# Patient Record
Sex: Female | Born: 1950
Health system: Southern US, Community
[De-identification: ages and names within clinical notes are randomized; demographics above are authoritative.]

## PROBLEM LIST (undated history)

## (undated) DIAGNOSIS — K460 Unspecified abdominal hernia with obstruction, without gangrene: Secondary | ICD-10-CM

## (undated) DIAGNOSIS — T7840XA Allergy, unspecified, initial encounter: Secondary | ICD-10-CM

## (undated) DIAGNOSIS — T4145XA Adverse effect of unspecified anesthetic, initial encounter: Secondary | ICD-10-CM

## (undated) DIAGNOSIS — T8859XA Other complications of anesthesia, initial encounter: Secondary | ICD-10-CM

## (undated) DIAGNOSIS — E669 Obesity, unspecified: Secondary | ICD-10-CM

## (undated) DIAGNOSIS — C50919 Malignant neoplasm of unspecified site of unspecified female breast: Secondary | ICD-10-CM

## (undated) DIAGNOSIS — I10 Essential (primary) hypertension: Secondary | ICD-10-CM

## (undated) HISTORY — DX: Allergy, unspecified, initial encounter: T78.40XA

## (undated) HISTORY — DX: Obesity, unspecified: E66.9

## (undated) HISTORY — DX: Adverse effect of unspecified anesthetic, initial encounter: T41.45XA

## (undated) HISTORY — DX: Essential (primary) hypertension: I10

## (undated) HISTORY — DX: Other complications of anesthesia, initial encounter: T88.59XA

## (undated) HISTORY — DX: Malignant neoplasm of unspecified site of unspecified female breast: C50.919

---

## 1984-03-31 HISTORY — PX: ABDOMINAL HYSTERECTOMY: SHX81

## 2002-01-10 ENCOUNTER — Ambulatory Visit (HOSPITAL_COMMUNITY): Admission: RE | Admit: 2002-01-10 | Discharge: 2002-01-10 | Payer: Self-pay | Admitting: Family Medicine

## 2002-01-10 ENCOUNTER — Encounter: Payer: Self-pay | Admitting: Family Medicine

## 2002-10-27 ENCOUNTER — Encounter: Payer: Self-pay | Admitting: Family Medicine

## 2002-10-27 ENCOUNTER — Ambulatory Visit (HOSPITAL_COMMUNITY): Admission: RE | Admit: 2002-10-27 | Discharge: 2002-10-27 | Payer: Self-pay | Admitting: *Deleted

## 2002-11-08 ENCOUNTER — Ambulatory Visit (HOSPITAL_COMMUNITY): Admission: RE | Admit: 2002-11-08 | Discharge: 2002-11-08 | Payer: Self-pay | Admitting: Family Medicine

## 2002-11-08 ENCOUNTER — Encounter: Payer: Self-pay | Admitting: Family Medicine

## 2002-12-19 ENCOUNTER — Ambulatory Visit (HOSPITAL_COMMUNITY): Admission: RE | Admit: 2002-12-19 | Discharge: 2002-12-19 | Payer: Self-pay | Admitting: General Surgery

## 2002-12-19 ENCOUNTER — Encounter: Payer: Self-pay | Admitting: General Surgery

## 2003-11-07 ENCOUNTER — Ambulatory Visit (HOSPITAL_COMMUNITY): Admission: RE | Admit: 2003-11-07 | Discharge: 2003-11-07 | Payer: Self-pay | Admitting: Family Medicine

## 2004-11-13 ENCOUNTER — Ambulatory Visit (HOSPITAL_COMMUNITY): Admission: RE | Admit: 2004-11-13 | Discharge: 2004-11-13 | Payer: Self-pay | Admitting: Family Medicine

## 2005-11-17 ENCOUNTER — Ambulatory Visit (HOSPITAL_COMMUNITY): Admission: RE | Admit: 2005-11-17 | Discharge: 2005-11-17 | Payer: Self-pay | Admitting: Family Medicine

## 2006-11-23 ENCOUNTER — Ambulatory Visit (HOSPITAL_COMMUNITY): Admission: RE | Admit: 2006-11-23 | Discharge: 2006-11-23 | Payer: Self-pay | Admitting: Family Medicine

## 2006-12-03 ENCOUNTER — Encounter: Admission: RE | Admit: 2006-12-03 | Discharge: 2006-12-03 | Payer: Self-pay | Admitting: Family Medicine

## 2007-03-29 ENCOUNTER — Ambulatory Visit (HOSPITAL_COMMUNITY): Admission: RE | Admit: 2007-03-29 | Discharge: 2007-03-29 | Payer: Self-pay | Admitting: Family Medicine

## 2007-07-13 ENCOUNTER — Emergency Department (HOSPITAL_COMMUNITY): Admission: EM | Admit: 2007-07-13 | Discharge: 2007-07-13 | Payer: Self-pay | Admitting: Emergency Medicine

## 2007-12-08 ENCOUNTER — Ambulatory Visit (HOSPITAL_COMMUNITY): Admission: RE | Admit: 2007-12-08 | Discharge: 2007-12-08 | Payer: Self-pay | Admitting: Family Medicine

## 2007-12-24 ENCOUNTER — Ambulatory Visit (HOSPITAL_COMMUNITY): Admission: RE | Admit: 2007-12-24 | Discharge: 2007-12-24 | Payer: Self-pay | Admitting: Obstetrics & Gynecology

## 2008-01-17 ENCOUNTER — Encounter (HOSPITAL_COMMUNITY): Admission: RE | Admit: 2008-01-17 | Discharge: 2008-02-16 | Payer: Self-pay | Admitting: Endocrinology

## 2008-04-28 ENCOUNTER — Ambulatory Visit (HOSPITAL_COMMUNITY): Admission: RE | Admit: 2008-04-28 | Discharge: 2008-04-28 | Payer: Self-pay | Admitting: Family Medicine

## 2008-12-18 ENCOUNTER — Ambulatory Visit (HOSPITAL_COMMUNITY): Admission: RE | Admit: 2008-12-18 | Discharge: 2008-12-18 | Payer: Self-pay | Admitting: Family Medicine

## 2009-12-04 ENCOUNTER — Emergency Department (HOSPITAL_COMMUNITY): Admission: EM | Admit: 2009-12-04 | Discharge: 2009-12-04 | Payer: Self-pay | Admitting: Emergency Medicine

## 2009-12-24 ENCOUNTER — Ambulatory Visit (HOSPITAL_COMMUNITY): Admission: RE | Admit: 2009-12-24 | Discharge: 2009-12-24 | Payer: Self-pay | Admitting: Family Medicine

## 2010-04-22 ENCOUNTER — Encounter: Payer: Self-pay | Admitting: Family Medicine

## 2010-08-16 NOTE — Op Note (Signed)
   NAME:  ADELIS, DOCTER NO.:  [id number]   MEDICAL RECORD NO.:  [id number]                   PATIENT TYPE:   LOCATION:                                       FACILITY:   PHYSICIAN:  Dalia Heading, M.D.               DATE OF BIRTH:   DATE OF PROCEDURE:  12/19/2002  DATE OF DISCHARGE:                                 OPERATIVE REPORT   PREOPERATIVE DIAGNOSIS:  Right breast mass, nonpalpable.   POSTOPERATIVE DIAGNOSIS:  Right breast mass, nonpalpable.   PROCEDURE:  Right breast biopsy after needle localization.   SURGEON:  Dalia Heading, M.D.   ANESTHESIA:  MAC   INDICATIONS:  The patient is a 60 year old black female who presents with  abnormal microcalcifications in the outer portion of the right breast.  The  risks and benefits of the procedure including bleeding and infection were  fully explained to the patient, who gave informed consent.   DESCRIPTION OF PROCEDURE:  The patient was placed in the supine position  after undergoing needle localization in the x-ray department.  The right breast was prepped and draped using the usual sterile technique  with Betadine.  Surgical site confirmation was performed.   Xylocaine 1% was used for local anesthesia.  An incision was made around the  needle and this was taken down.  The dissection was taken down around the  needle to its tip.  The initial specimen radiography did not reveal the  microcalcifications to be present.  Two additional samples were removed and  the third one did contain the microcalcifications.  This was then sent to  pathology for further examination.  Any bleeding was controlled using Bovie  electrocautery.  The skin was reapproximated using a 4-0 Vicryl subcuticular  suture.  Steri-Strips and a dry sterile dressing were applied.   All tape and needle counts were correct at the end of the procedure.  The  patient was transferred to PACU in stable condition.   COMPLICATIONS:   None    SPECIMEN:  Right breast biopsy   BLOOD LOSS:  Minimal      ___________________________________________                                            Dalia Heading, M.D.   MAJ/MEDQ  D:  12/19/2002  T:  12/19/2002  Job:  [id number]   cc:   Corrie Mckusick, M.D.  [address]. [address]  [place]  [place] [address]  Fax: [phone]

## 2010-08-16 NOTE — H&P (Signed)
   NAME:  Chelsey Johnson, Chelsey Johnson NO.:  [id number]   MEDICAL RECORD NO.:  [id number]                   PATIENT TYPE:   LOCATION:                                       FACILITY:   PHYSICIAN:  Dalia Heading, M.D.               DATE OF BIRTH:   DATE OF ADMISSION:  DATE OF DISCHARGE:                                HISTORY & PHYSICAL   CHIEF COMPLAINT:  Right breast mass, nonpalpable.   HISTORY OF PRESENT ILLNESS:  The patient is a 60 year old black female who  is referred for evaluation and treatment of a right breast mass.  It is not  palpable.  It was seen on routine mammography in the upper, outer quadrant.  No nipple discharge or family history of breast carcinoma is noted.  She is  status post a hysterectomy with bilateral salpingo-oophorectomy in the  remote past.   PAST MEDICAL HISTORY:  Hypertension.   PAST SURGICAL HISTORY:  Hysterectomy with bilateral salpingo-oophorectomy.   CURRENT MEDICATIONS:  Uniretic, hormone supplementation.   ALLERGIES:  TYLOX ands SUDAFED.   REVIEW OF SYSTEMS:  The patient denies drinking or smoking.  She denies any  other cardiopulmonary difficulties or bleeding disorders.   PHYSICAL EXAMINATION:  GENERAL:  The patient is a well-developed, well-  nourished black female in no acute distress.  VITAL SIGNS:  She is afebrile and vital signs are stable.  NECK:  Neck is supple without lymphadenopathy.  RESPIRATORY:  Lungs clear to auscultation with equal breath sounds  bilaterally.  CARDIAC:  Heart examination reveals regular rate and rhythm without S3, S4,  or murmurs.  BREASTS:  Right breast examination reveals no dominant mass, nipple  discharge, or dimpling.  The axilla is negative for palpable nodes.  Left  breast examination reveals no dominant mass, nipple discharge or dimpling.  The axilla is negative for palpable nodes.   MAMMOGRAPHY:  Mammography reveals suspicious microcalcifications in the  right breast,  upper outer quadrant.   IMPRESSION:  Right breast mass, nonpalpable, microcalcifications.    PLAN:  The patient is scheduled for right breast biopsy after needle  localization on December 19, 2002.  The risks and benefits of the procedure  including bleeding and infection were fully explained to the patient, who  gave informed consent.                                               Dalia Heading, M.D.    MAJ/MEDQ  D:  11/17/2002  T:  11/17/2002  Job:  [id number]   cc:   Corrie Mckusick, M.D.  [address] [place]. [address]  [place]  [place] [address]  Fax: [phone]

## 2010-11-18 ENCOUNTER — Other Ambulatory Visit (HOSPITAL_COMMUNITY): Payer: Self-pay | Admitting: Family Medicine

## 2010-11-18 DIAGNOSIS — Z139 Encounter for screening, unspecified: Secondary | ICD-10-CM

## 2010-12-27 ENCOUNTER — Ambulatory Visit (HOSPITAL_COMMUNITY)
Admission: RE | Admit: 2010-12-27 | Discharge: 2010-12-27 | Disposition: A | Discharge status: Home / Self Care | Payer: BC Managed Care – PPO | Source: Ambulatory Visit | Attending: Family Medicine | Admitting: Family Medicine

## 2010-12-27 DIAGNOSIS — Z1231 Encounter for screening mammogram for malignant neoplasm of breast: Secondary | ICD-10-CM | POA: Insufficient documentation

## 2010-12-27 DIAGNOSIS — Z139 Encounter for screening, unspecified: Secondary | ICD-10-CM

## 2011-08-18 ENCOUNTER — Ambulatory Visit (HOSPITAL_COMMUNITY)
Admission: RE | Admit: 2011-08-18 | Discharge: 2011-08-18 | Disposition: A | Discharge status: Home / Self Care | Payer: BC Managed Care – PPO | Source: Ambulatory Visit | Attending: Internal Medicine | Admitting: Internal Medicine

## 2011-08-18 ENCOUNTER — Other Ambulatory Visit (HOSPITAL_COMMUNITY): Payer: Self-pay | Admitting: Internal Medicine

## 2011-08-18 DIAGNOSIS — M7989 Other specified soft tissue disorders: Secondary | ICD-10-CM | POA: Insufficient documentation

## 2011-08-18 DIAGNOSIS — R609 Edema, unspecified: Secondary | ICD-10-CM

## 2011-09-11 ENCOUNTER — Other Ambulatory Visit: Payer: Self-pay | Admitting: Gastroenterology

## 2011-09-11 ENCOUNTER — Encounter: Payer: Self-pay | Admitting: Urgent Care

## 2011-09-11 ENCOUNTER — Ambulatory Visit (INDEPENDENT_AMBULATORY_CARE_PROVIDER_SITE_OTHER): Payer: BC Managed Care – PPO | Admitting: Urgent Care

## 2011-09-11 VITALS — BP 123/79 | HR 76 | Temp 97.2°F | Ht 61.0 in | Wt 218.0 lb

## 2011-09-11 DIAGNOSIS — K625 Hemorrhage of anus and rectum: Secondary | ICD-10-CM

## 2011-09-11 NOTE — Assessment & Plan Note (Addendum)
Chelsey Johnson is a pleasant 61 y.o. female who has had intermittent small volume hematochezia with straining to have a bowel movement. She has never had colonoscopy. Colonoscopy for further evaluation to determine etiology of rectal bleeding including benign anorectal source, colon polyp or carcinoma.  I have discussed risks & benefits which include, but are not limited to, bleeding, infection, perforation & drug reaction.  The patient agrees with this plan & written consent will be obtained.    If you have severe bleeding, go directly to the ER call us

## 2011-09-11 NOTE — Patient Instructions (Addendum)
Colonoscopy w/ Dr Darrick Penna If you have severe bleeding, call or directly to ER

## 2011-09-11 NOTE — Progress Notes (Signed)
Faxed to PCP

## 2011-09-11 NOTE — Progress Notes (Signed)
Referring Provider: Colette Ribas, MD Primary Care Physician:  Colette Ribas, MD Primary Gastroenterologist:  Dr. Jonette Eva   Chief Complaint  Patient presents with  . Colonoscopy    HPI:  Chelsey Johnson is a 61 y.o. female here as a referral from Dr. Phillips Odor for screening colonoscopy.  Upon further triage, it was noted that she has been seeing rectal bleeding, therefore she was asked to come in for office visit to discuss this further prior to procedure. She has seen "specks" of bright red blood on toilet tissue after straining to have a bowel movement intermittently for several months.  She generally has a daily soft brown BM.  He does admit to being prunes, but really doesn't have significantly hard stools.   Denies constipation, diarrhea,melena or weight loss.   Denies any upper GI symptoms including heartburn, indigestion, nausea, vomiting, dysphagia, odynophagia or anorexia.  No NSAIDs. Past Medical History  Diagnosis Date  . HTN (hypertension)   . Anesthesia complication     tachycardia  . Obesity     Past Surgical History  Procedure Date  . Abdominal hysterectomy 1986    complete, uterine fibroids  . Breast biopsy     Current Outpatient Prescriptions  Medication Sig Dispense Refill  . estradiol (ESTRACE) 1 MG tablet Take 1 mg by mouth daily.      . Moexipril-Hydrochlorothiazide 15-12.5 MG TABS Take by mouth daily.         Allergies as of 09/11/2011 - Review Complete 09/11/2011  Allergen Reaction Noted  . Anesthetics, amide Palpitations 09/11/2011  . Tylox (oxycodone-acetaminophen) Palpitations 09/11/2011    Family History  Problem Relation Age of Onset  . Ulcers Brother   . Colon polyps Mother 74    History   Social History  . Marital Status: Single    Spouse Name: N/A    Number of Children: 0  . Years of Education: N/A   Occupational History  . [employer]    Social History Main Topics  . Smoking status: Former  Smoker -- 1.0 packs/day    Types: Cigarettes    Quit date: 09/11/1991  . Smokeless tobacco: Not on file   Comment: quit about 20 + years  . Alcohol Use: No  . Drug Use: No  . Sexually Active: Not on file   Other Topics Concern  . Not on file   Social History Narrative   Lives w/ mother    Review of Systems: Gen: Denies any fever, chills, sweats, anorexia, fatigue, weakness, malaise, weight loss, and sleep disorder CV: Denies chest pain, angina, palpitations, syncope, orthopnea, PND, peripheral edema, and claudication. Resp: Denies dyspnea at rest, dyspnea with exercise, cough, sputum, wheezing, coughing up blood, and pleurisy. GI: Denies vomiting blood, jaundice, and fecal incontinence.    GU : Denies urinary burning, blood in urine, urinary frequency, urinary hesitancy, nocturnal urination, and urinary incontinence. MS: Denies joint pain, limitation of movement, and swelling, stiffness, low back pain, extremity pain. Denies muscle weakness, cramps, atrophy.  Derm: Denies rash, itching, dry skin, hives, moles, warts, or unhealing ulcers.  Psych: Denies depression, anxiety, memory loss, suicidal ideation, hallucinations, paranoia, and confusion. Heme: Denies bruising and enlarged lymph nodes. Neuro:  Denies any headaches, dizziness, paresthesias. Endo:  Denies any problems with DM, thyroid, adrenal function.  Physical Exam: BP 123/79  Pulse 76  Temp 97.2 F (36.2 C) (Temporal)  Ht 5\' 1"  (1.549 m)  Wt 218 lb (98.884 kg)  BMI 41.19 kg/m2 General:  Alert,  Well-developed, obese, pleasant and cooperative in NAD Head:  Normocephalic and atraumatic. Eyes:  Sclera clear, no icterus.   Conjunctiva pink. Ears:  Normal auditory acuity. Nose:  No deformity, discharge, or lesions. Mouth:  No deformity or lesions,oropharynx pink & moist. Neck:  Supple; no masses or thyromegaly. Lungs:  Clear throughout to auscultation.   No wheezes, crackles, or rhonchi. No acute distress. Heart:   Regular rate and rhythm; no murmurs, clicks, rubs,  or gallops. Abdomen:  Normal bowel sounds.  Obese.  No bruits.  Soft, non-tender and non-distended without masses, hepatosplenomegaly or hernias noted.  No guarding or rebound tenderness.   Rectal:  Deferred. Msk:  Symmetrical without gross deformities. Normal posture. Pulses:  Normal pulses noted. Extremities:  No clubbing or edema. Neurologic:  Alert and oriented x4;  grossly normal neurologically. Skin:  Intact without significant lesions or rashes. Lymph Nodes:  No significant cervical adenopathy. Psych:  Alert and cooperative. Normal mood and affect.

## 2011-09-12 ENCOUNTER — Encounter (HOSPITAL_COMMUNITY): Payer: Self-pay | Admitting: Pharmacy Technician

## 2011-09-18 MED ORDER — SODIUM CHLORIDE 0.45 % IV SOLN
Freq: Once | INTRAVENOUS | Status: AC
  Administered 2011-09-19: 12:00:00 via INTRAVENOUS

## 2011-09-19 ENCOUNTER — Ambulatory Visit (HOSPITAL_COMMUNITY)
Admission: RE | Admit: 2011-09-19 | Discharge: 2011-09-19 | Disposition: A | Discharge status: Home Health | Payer: BC Managed Care – PPO | Source: Ambulatory Visit | Attending: Gastroenterology | Admitting: Gastroenterology

## 2011-09-19 ENCOUNTER — Encounter (HOSPITAL_COMMUNITY)
Admission: RE | Disposition: A | Discharge status: Home Health | Payer: Self-pay | Source: Ambulatory Visit | Attending: Gastroenterology

## 2011-09-19 ENCOUNTER — Encounter (HOSPITAL_COMMUNITY): Payer: Self-pay

## 2011-09-19 DIAGNOSIS — Z1211 Encounter for screening for malignant neoplasm of colon: Secondary | ICD-10-CM

## 2011-09-19 DIAGNOSIS — K648 Other hemorrhoids: Secondary | ICD-10-CM

## 2011-09-19 DIAGNOSIS — I10 Essential (primary) hypertension: Secondary | ICD-10-CM | POA: Insufficient documentation

## 2011-09-19 DIAGNOSIS — Z79899 Other long term (current) drug therapy: Secondary | ICD-10-CM | POA: Insufficient documentation

## 2011-09-19 DIAGNOSIS — K625 Hemorrhage of anus and rectum: Secondary | ICD-10-CM

## 2011-09-19 HISTORY — DX: Other complications of anesthesia, initial encounter: T88.59XA

## 2011-09-19 HISTORY — DX: Adverse effect of unspecified anesthetic, initial encounter: T41.45XA

## 2011-09-19 HISTORY — PX: COLONOSCOPY: SHX5424

## 2011-09-19 SURGERY — COLONOSCOPY
Anesthesia: Moderate Sedation

## 2011-09-19 MED ORDER — MEPERIDINE HCL 100 MG/ML IJ SOLN
INTRAMUSCULAR | Status: AC
  Filled 2011-09-19: qty 2

## 2011-09-19 MED ORDER — MIDAZOLAM HCL 5 MG/5ML IJ SOLN
INTRAMUSCULAR | Status: DC | PRN
  Administered 2011-09-19 (×3): 1 mg via INTRAVENOUS
  Administered 2011-09-19: 2 mg via INTRAVENOUS

## 2011-09-19 MED ORDER — MEPERIDINE HCL 100 MG/ML IJ SOLN
INTRAMUSCULAR | Status: DC | PRN
  Administered 2011-09-19 (×2): 25 mg via INTRAVENOUS

## 2011-09-19 MED ORDER — MIDAZOLAM HCL 5 MG/5ML IJ SOLN
INTRAMUSCULAR | Status: AC
  Filled 2011-09-19: qty 10

## 2011-09-19 NOTE — Op Note (Signed)
[facility] [address] [place], [place]  [address]  COLONOSCOPY PROCEDURE REPORT  PATIENT:  Chelsey, Johnson  MR#:  [id number] BIRTHDATE:  05/27/1950, 60 yrs. old  GENDER:  female  ENDOSCOPIST:  Jonette Eva, MD REF. BY:  Assunta Found, M.D. ASSISTANT:  PROCEDURE DATE:  09/19/2011 PROCEDURE:  Colonoscopy 784[address]  INDICATIONS:  Screening  MEDICATIONS:   Demerol 50 mg IV, Versed 5 mg IV  DESCRIPTION OF PROCEDURE:    Physical exam was performed. Informed consent was obtained from the patient after explaining the benefits, risks, and alternatives to procedure.  The patient was connected to monitor and placed in left lateral position. Continuous oxygen was provided by nasal cannula and IV medicine administered through an indwelling cannula.  After administration of sedation and rectal exam, the patient's rectum was intubated and the EC-[id number] ([id number]) colonoscope was advanced under direct visualization to the cecum.  The scope was removed slowly by carefully examining the color, texture, anatomy, and integrity mucosa on the way out.  The patient was recovered in endoscopy and discharged home in satisfactory condition. <<PROCEDUREIMAGES>>  FINDINGS:  Normal colonoscopy without polyps, masses, vascular ectasias, or inflammatory changes.  MODERATE Internal Hemorrhoids were found.  PREP QUALITY:  EXCELLENT CECAL W/D TIME:    14.5 minutes  COMPLICATIONS:    None  ENDOSCOPIC IMPRESSION: 1) Internal hemorrhoids  RECOMMENDATIONS: High fiber diet TCS IN 10 YEARS WITH AN OVERTUBE  REPEAT EXAM:  No  ______________________________ Jonette Eva, MD  CC:  Assunta Found, M.D.  n. eSIGNED:   Sokha Craker at 09/19/2011 01:23 PM  [place], [place], [phone]

## 2011-09-19 NOTE — H&P (Signed)
  Primary Care Physician:  Colette Ribas, MD Primary Gastroenterologist:  Dr. Darrick Penna  Pre-Procedure History & Physical: HPI:  Chelsey Johnson is a 61 y.o. female here for  BRBPR.   Past Medical History  Diagnosis Date  . HTN (hypertension)   . Anesthesia complication     tachycardia  . Obesity   . Complication of anesthesia     blood pressure drops quickly with anesthetic    Past Surgical History  Procedure Date  . Abdominal hysterectomy 1986    complete, uterine fibroids  . Breast biopsy     Prior to Admission medications   Medication Sig Start Date End Date Taking? Authorizing Provider  cholecalciferol (VITAMIN D) 1000 UNITS tablet Take 1,000 Units by mouth daily.   Yes Historical Provider, MD  estradiol (ESTRACE) 1 MG tablet Take 1 mg by mouth daily.   Yes Historical Provider, MD  Moexipril-Hydrochlorothiazide 15-12.5 MG TABS Take by mouth daily.  08/22/11  Yes Historical Provider, MD  OMEGA 3 1200 MG CAPS Take 1 capsule by mouth daily.   Yes Historical Provider, MD    Allergies as of 09/11/2011 - Review Complete 09/11/2011  Allergen Reaction Noted  . Anesthetics, amide Palpitations 09/11/2011  . Tylox (oxycodone-acetaminophen) Palpitations 09/11/2011    Family History  Problem Relation Age of Onset  . Ulcers Brother   . Colon polyps Mother 72    History   Social History  . Marital Status: Single    Spouse Name: N/A    Number of Children: 0  . Years of Education: N/A   Occupational History  . [employer]    Social History Main Topics  . Smoking status: Former Smoker -- 1.0 packs/day    Types: Cigarettes    Quit date: 09/11/1991  . Smokeless tobacco: Not on file   Comment: quit about 20 + years  . Alcohol Use: No  . Drug Use: No  . Sexually Active: Not on file   Other Topics Concern  . Not on file   Social History Narrative   Lives w/ mother    Review of Systems: See HPI, otherwise negative ROS   Physical  Exam: BP 138/90  Pulse 97  Temp 98.6 F (37 C) (Oral)  Resp 22  SpO2 99% General:   Alert,  pleasant and cooperative in NAD Head:  Normocephalic and atraumatic. Neck:  Supple;  Lungs:  Clear throughout to auscultation.    Heart:  Regular rate and rhythm. Abdomen:  Soft, nontender and nondistended. Normal bowel sounds, without guarding, and without rebound.   Neurologic:  Alert and  oriented x4;  grossly normal neurologically.  Impression/Plan:    BRBPR  PLAN: TCS TODAY

## 2011-09-19 NOTE — Discharge Instructions (Signed)
You have internal hemorrhoids. YOU DID NOT HAVE ANY POLYPS. ° ° °FOLLOW A HIGH FIBER DIET. AVOID ITEMS THAT CAUSE BLOATING. SEE INFO BELOW. ° °Next colonoscopy in 10 years. ° ° °Colonoscopy °Care After °Read the instructions outlined below and refer to this sheet in the next week. These discharge instructions provide you with general information on caring for yourself after you leave the hospital. While your treatment has been planned according to the most current medical practices available, unavoidable complications occasionally occur. If you have any problems or questions after discharge, call DR. Satori Krabill, [phone]. ° °ACTIVITY °· You may resume your regular activity, but move at a slower pace for the next 24 hours.  °· Take frequent rest periods for the next 24 hours.  °· Walking will help get rid of the air and reduce the bloated feeling in your belly (abdomen).  °· No driving for 24 hours (because of the medicine (anesthesia) used during the test).  °· You may shower.  °· Do not sign any important legal documents or operate any machinery for 24 hours (because of the anesthesia used during the test).  °·  °NUTRITION °· Drink plenty of fluids.  °· You may resume your normal diet as instructed by your doctor.  °· Begin with a light meal and progress to your normal diet. Heavy or fried foods are harder to digest and may make you feel sick to your stomach (nauseated).  °· Avoid alcoholic beverages for 24 hours or as instructed.  °·  °MEDICATIONS °· You may resume your normal medications. °·  °WHAT YOU CAN EXPECT TODAY °· Some feelings of bloating in the abdomen.  °· Passage of more gas than usual.  °· Spotting of blood in your stool or on the toilet paper °· .  °IF YOU HAD POLYPS REMOVED DURING THE COLONOSCOPY: °· Eat a soft diet IF YOU HAVE NAUSEA, BLOATING, ABDOMINAL PAIN, OR VOMITING. °·   °FINDING OUT THE RESULTS OF YOUR TEST °Not all test results are available during your visit. DR. Lanya Bucks WILL CALL YOU  WITHIN 7 DAYS OF YOUR PROCEDUE WITH YOUR RESULTS. Do not assume everything is normal if you have not heard from DR. Cameo Schmiesing IN ONE WEEK, CALL HER OFFICE AT [phone]. ° °SEEK IMMEDIATE MEDICAL ATTENTION AND CALL THE OFFICE: [phone] IF: °· You have more than a spotting of blood in your stool.  °· Your belly is swollen (abdominal distention).  °· You are nauseated or vomiting.  °· You have a temperature over 101F.  °· You have abdominal pain or discomfort that is severe or gets worse throughout the day. ° °High-Fiber Diet °A high-fiber diet changes your normal diet to include more whole grains, legumes, fruits, and vegetables. Changes in the diet involve replacing refined carbohydrates with unrefined foods. The calorie level of the diet is essentially unchanged. The Dietary Reference Intake (recommended amount) for adult males is 38 grams per day. For adult females, it is 25 grams per day. Pregnant and lactating women should consume 28 grams of fiber per day. °Fiber is the intact part of a plant that is not broken down during digestion. Functional fiber is fiber that has been isolated from the plant to provide a beneficial effect in the body. °PURPOSE °· Increase stool bulk.  °· Ease and regulate bowel movements.  °· Lower cholesterol.  °INDICATIONS THAT YOU NEED MORE FIBER °· Constipation and hemorrhoids.  °· Uncomplicated diverticulosis (intestine condition) and irritable bowel syndrome.  °· Weight management.  °· As   a protective measure against hardening of the arteries (atherosclerosis), diabetes, and cancer.  ° °GUIDELINES FOR INCREASING FIBER IN THE DIET °· Start adding fiber to the diet slowly. A gradual increase of about 5 more grams (2 slices of whole-wheat bread, 2 servings of most fruits or vegetables, or 1 bowl of high-fiber cereal) per day is best. Too rapid an increase in fiber may result in constipation, flatulence, and bloating.  °· Drink enough water and fluids to keep your urine clear or pale  yellow. Water, juice, or caffeine-free drinks are recommended. Not drinking enough fluid may cause constipation.  °· Eat a variety of high-fiber foods rather than one type of fiber.  °· Try to increase your intake of fiber through using high-fiber foods rather than fiber pills or supplements that contain small amounts of fiber.  °· The goal is to change the types of food eaten. Do not supplement your present diet with high-fiber foods, but replace foods in your present diet.  °INCLUDE A VARIETY OF FIBER SOURCES °· Replace refined and processed grains with whole grains, canned fruits with fresh fruits, and incorporate other fiber sources. White rice, white breads, and most bakery goods contain little or no fiber.  °· Brown whole-grain rice, buckwheat oats, and many fruits and vegetables are all good sources of fiber. These include: broccoli, Brussels sprouts, cabbage, cauliflower, beets, sweet potatoes, white potatoes (skin on), carrots, tomatoes, eggplant, squash, berries, fresh fruits, and dried fruits.  °· Cereals appear to be the richest source of fiber. Cereal fiber is found in whole grains and bran. Bran is the fiber-rich outer coat of cereal grain, which is largely removed in refining. In whole-grain cereals, the bran remains. In breakfast cereals, the largest amount of fiber is found in those with "bran" in their names. The fiber content is sometimes indicated on the label.  °· You may need to include additional fruits and vegetables each day.  °· In baking, for 1 cup white flour, you may use the following substitutions:  °· 1 cup whole-wheat flour minus 2 tablespoons.  °· 1/2 cup white flour plus 1/2 cup whole-wheat flour.  ° °Hemorrhoids °Hemorrhoids are dilated (enlarged) veins around the rectum. Sometimes clots will form in the veins. This makes them swollen and painful. These are called thrombosed hemorrhoids. °Causes of hemorrhoids include: °· Constipation.  °· Straining to have a bowel movement. °·   HEAVY LIFTING °HOME CARE INSTRUCTIONS °· Eat a well balanced diet and drink 6 to 8 glasses of water every day to avoid constipation. You may also use a bulk laxative.  °· Avoid straining to have bowel movements.  °· Keep anal area dry and clean.  °· Do not use a donut shaped pillow or sit on the toilet for long periods. This increases blood pooling and pain.  °· Move your bowels when your body has the urge; this will require less straining and will decrease pain and pressure.  ° °

## 2011-09-24 ENCOUNTER — Encounter (HOSPITAL_COMMUNITY): Payer: Self-pay | Admitting: Gastroenterology

## 2011-10-04 ENCOUNTER — Emergency Department (HOSPITAL_COMMUNITY): Payer: BC Managed Care – PPO

## 2011-10-04 ENCOUNTER — Emergency Department (HOSPITAL_COMMUNITY)
Admission: EM | Admit: 2011-10-04 | Discharge: 2011-10-04 | Disposition: A | Discharge status: Home / Self Care | Payer: BC Managed Care – PPO | Attending: Emergency Medicine | Admitting: Emergency Medicine

## 2011-10-04 ENCOUNTER — Encounter (HOSPITAL_COMMUNITY): Payer: Self-pay | Admitting: *Deleted

## 2011-10-04 DIAGNOSIS — R5381 Other malaise: Secondary | ICD-10-CM | POA: Insufficient documentation

## 2011-10-04 DIAGNOSIS — M62830 Muscle spasm of back: Secondary | ICD-10-CM

## 2011-10-04 DIAGNOSIS — R5383 Other fatigue: Secondary | ICD-10-CM | POA: Insufficient documentation

## 2011-10-04 DIAGNOSIS — R0602 Shortness of breath: Secondary | ICD-10-CM | POA: Insufficient documentation

## 2011-10-04 DIAGNOSIS — I10 Essential (primary) hypertension: Secondary | ICD-10-CM | POA: Insufficient documentation

## 2011-10-04 DIAGNOSIS — M5380 Other specified dorsopathies, site unspecified: Secondary | ICD-10-CM | POA: Insufficient documentation

## 2011-10-04 LAB — D-DIMER, QUANTITATIVE: D-Dimer, Quant: 0.24 ug/mL-FEU (ref 0.00–0.48)

## 2011-10-04 MED ORDER — HYDROCODONE-ACETAMINOPHEN 5-325 MG PO TABS
2.0000 | ORAL_TABLET | Freq: Once | ORAL | Status: AC
  Administered 2011-10-04: 2 via ORAL
  Filled 2011-10-04: qty 2

## 2011-10-04 MED ORDER — IBUPROFEN 800 MG PO TABS: 800.0000 mg | ORAL_TABLET | Freq: Three times a day (TID) | ORAL | Status: AC

## 2011-10-04 MED ORDER — HYDROCODONE-ACETAMINOPHEN 5-325 MG PO TABS: 2.0000 | ORAL_TABLET | ORAL | Status: AC | PRN

## 2011-10-04 MED ORDER — DIAZEPAM 5 MG PO TABS
5.0000 mg | ORAL_TABLET | Freq: Once | ORAL | Status: AC
  Administered 2011-10-04: 5 mg via ORAL
  Filled 2011-10-04: qty 1

## 2011-10-04 MED ORDER — DIAZEPAM 5 MG PO TABS: 5.0000 mg | ORAL_TABLET | Freq: Two times a day (BID) | ORAL | Status: AC

## 2011-10-04 MED ORDER — KETOROLAC TROMETHAMINE 60 MG/2ML IM SOLN
60.0000 mg | Freq: Once | INTRAMUSCULAR | Status: AC
  Administered 2011-10-04: 60 mg via INTRAMUSCULAR
  Filled 2011-10-04: qty 2

## 2011-10-04 NOTE — ED Provider Notes (Signed)
History     CSN: [id number]  Arrival date & time 10/04/11  1929   First MD Initiated Contact with Patient 10/04/11 1946      Chief Complaint  Patient presents with  . Back Pain    (Consider location/radiation/quality/duration/timing/severity/associated sxs/prior treatment) HPI Comments: 2 days of L sided intermittent back spasms that come and go.  Previous history of same.  Denies injury to back.  Paraspinal thoracic pain with spasm.  No midline pain.  Pain radiates up to neck and lower back. No bowel or bladder incontinence. No weakness, numbness, tingling.  No fever or vomiting. Taking "pain pill" at home without relief.  The history is provided by the patient.    Past Medical History  Diagnosis Date  . HTN (hypertension)   . Anesthesia complication     tachycardia  . Obesity   . Complication of anesthesia     blood pressure drops quickly with anesthetic    Past Surgical History  Procedure Date  . Abdominal hysterectomy 1986    complete, uterine fibroids  . Breast biopsy   . Colonoscopy 09/19/2011    Procedure: COLONOSCOPY;  Surgeon: West Bali, MD;  Location: [facility];  Service: Endoscopy;  Laterality: N/A;  12:30    Family History  Problem Relation Age of Onset  . Ulcers Brother   . Colon polyps Mother 52    History  Substance Use Topics  . Smoking status: Former Smoker -- 1.0 packs/day    Types: Cigarettes    Quit date: 09/11/1991  . Smokeless tobacco: Not on file   Comment: quit about 20 + years  . Alcohol Use: No    OB History    Grav Para Term Preterm Abortions TAB SAB Ect Mult Living                  Review of Systems  Constitutional: Negative for fever, activity change and appetite change.  HENT: Negative for congestion, sore throat and rhinorrhea.   Respiratory: Positive for shortness of breath. Negative for cough and chest tightness.   Cardiovascular: Negative for chest pain.  Gastrointestinal: Negative for nausea, vomiting and  abdominal pain.  Genitourinary: Negative for dysuria.  Musculoskeletal: Positive for myalgias, back pain and arthralgias.  Skin: Negative for rash.  Neurological: Negative for dizziness, weakness and headaches.    Allergies  Anesthetics, amide and Tylox  Home Medications   Current Outpatient Rx  Name Route Sig Dispense Refill  . VITAMIN D 1000 UNITS PO TABS Oral Take 1,000 Units by mouth daily.    Marland Kitchen ESTRADIOL 1 MG PO TABS Oral Take 1 mg by mouth daily.    Marland Kitchen MOEXIPRIL-HYDROCHLOROTHIAZIDE 15-12.5 MG PO TABS Oral Take by mouth daily.     . OMEGA 3 1200 MG PO CAPS Oral Take 1 capsule by mouth daily.      BP 150/96  Pulse 86  Temp 98.5 F (36.9 C) (Oral)  Resp 20  Ht 5\' 2"  (1.575 m)  Wt 217 lb (98.431 kg)  BMI 39.69 kg/m2  SpO2 100%  Physical Exam  Constitutional: She is oriented to person, place, and time. She appears well-developed and well-nourished. No distress.  HENT:  Head: Normocephalic and atraumatic.  Mouth/Throat: Oropharynx is clear and moist. No oropharyngeal exudate.  Eyes: Conjunctivae and EOM are normal. Pupils are equal, round, and reactive to light.  Neck: Normal range of motion. Neck supple.  Cardiovascular: Normal rate, regular rhythm and normal heart sounds.   No murmur heard. Pulmonary/Chest:  Breath sounds normal. No respiratory distress.  Abdominal: Soft. There is no tenderness. There is no rebound and no guarding.  Musculoskeletal: She exhibits tenderness.       TTP L mid thoracic paraspinal muscles with spasm.  Neurological: She is alert and oriented to person, place, and time. No cranial nerve deficit.       Equal grip strengths bilaterally, 5/5 strength throughout, +2 patellar reflexes bilaterally.  +2 DP and PT pulses bilaterally.   Skin: Skin is warm.    ED Course  Procedures (including critical care time)   Labs Reviewed  D-DIMER, QUANTITATIVE   Dg Chest 2 View  10/04/2011  *RADIOLOGY REPORT*  Clinical Data: Back pain and weakness  CHEST  - 2 VIEW  Comparison: None.  Findings: Normal mediastinum and cardiac silhouette.  Normal pulmonary  vasculature.  No evidence of effusion, infiltrate, or pneumothorax.  No acute bony abnormality. Degenerative osteophytosis of the thoracic spine.  IMPRESSION: No acute cardiopulmonary process.  Original Report Authenticated By: Genevive Bi, M.D.     No diagnosis found.    MDM  Back spasm without neurological deficit.  Patient on hormone replacement and does endorse some pain with deep breathing so will check D-dimer. No neuro deficits, no back pain red flags.  Relief of spasm with meds. Chest x-ray negative. D-dimer negative      Glynn Octave, MD 10/04/11 2055

## 2011-10-04 NOTE — ED Notes (Signed)
Pt reporting muscle spasms in back starting about 2 days ago.  Reports pain in lower back.  Reports she has been taking pain medication with no relief.

## 2011-10-06 ENCOUNTER — Ambulatory Visit (HOSPITAL_COMMUNITY)
Admission: RE | Admit: 2011-10-06 | Discharge: 2011-10-06 | Disposition: A | Discharge status: Home / Self Care | Payer: BC Managed Care – PPO | Source: Ambulatory Visit | Attending: Family Medicine | Admitting: Family Medicine

## 2011-10-06 ENCOUNTER — Other Ambulatory Visit (HOSPITAL_COMMUNITY): Payer: Self-pay | Admitting: Family Medicine

## 2011-10-06 DIAGNOSIS — M899 Disorder of bone, unspecified: Secondary | ICD-10-CM | POA: Insufficient documentation

## 2011-10-06 DIAGNOSIS — M545 Low back pain: Secondary | ICD-10-CM

## 2011-10-06 DIAGNOSIS — M546 Pain in thoracic spine: Secondary | ICD-10-CM

## 2011-10-08 ENCOUNTER — Ambulatory Visit (HOSPITAL_COMMUNITY)
Admission: RE | Admit: 2011-10-08 | Discharge: 2011-10-08 | Disposition: A | Discharge status: Home / Self Care | Payer: BC Managed Care – PPO | Source: Ambulatory Visit | Attending: Family Medicine | Admitting: Family Medicine

## 2011-10-08 DIAGNOSIS — M47812 Spondylosis without myelopathy or radiculopathy, cervical region: Secondary | ICD-10-CM | POA: Insufficient documentation

## 2011-10-08 DIAGNOSIS — IMO0002 Reserved for concepts with insufficient information to code with codable children: Secondary | ICD-10-CM | POA: Insufficient documentation

## 2011-10-08 DIAGNOSIS — M5124 Other intervertebral disc displacement, thoracic region: Secondary | ICD-10-CM | POA: Insufficient documentation

## 2011-10-08 DIAGNOSIS — M546 Pain in thoracic spine: Secondary | ICD-10-CM | POA: Insufficient documentation

## 2011-10-08 DIAGNOSIS — M545 Low back pain: Secondary | ICD-10-CM

## 2011-10-30 NOTE — Progress Notes (Signed)
TCS June 2013 [facility]

## 2011-10-30 NOTE — Progress Notes (Signed)
REVIEWED.  

## 2011-11-25 ENCOUNTER — Other Ambulatory Visit (HOSPITAL_COMMUNITY): Payer: Self-pay | Admitting: Family Medicine

## 2011-11-25 DIAGNOSIS — Z139 Encounter for screening, unspecified: Secondary | ICD-10-CM

## 2011-12-08 ENCOUNTER — Ambulatory Visit (HOSPITAL_COMMUNITY)
Admission: RE | Admit: 2011-12-08 | Discharge: 2011-12-08 | Disposition: A | Discharge status: Home / Self Care | Payer: BC Managed Care – PPO | Source: Ambulatory Visit | Attending: Family Medicine | Admitting: Family Medicine

## 2011-12-08 DIAGNOSIS — Z139 Encounter for screening, unspecified: Secondary | ICD-10-CM

## 2011-12-29 ENCOUNTER — Ambulatory Visit (HOSPITAL_COMMUNITY)
Admission: RE | Admit: 2011-12-29 | Discharge: 2011-12-29 | Disposition: A | Discharge status: Home / Self Care | Payer: BC Managed Care – PPO | Source: Ambulatory Visit | Attending: Family Medicine | Admitting: Family Medicine

## 2011-12-29 DIAGNOSIS — Z1231 Encounter for screening mammogram for malignant neoplasm of breast: Secondary | ICD-10-CM | POA: Insufficient documentation

## 2013-01-03 ENCOUNTER — Other Ambulatory Visit (HOSPITAL_COMMUNITY): Payer: Self-pay | Admitting: Family Medicine

## 2013-01-03 DIAGNOSIS — Z139 Encounter for screening, unspecified: Secondary | ICD-10-CM

## 2013-01-13 ENCOUNTER — Ambulatory Visit (HOSPITAL_COMMUNITY)
Admission: RE | Admit: 2013-01-13 | Discharge: 2013-01-13 | Disposition: A | Discharge status: Home / Self Care | Payer: BC Managed Care – PPO | Source: Ambulatory Visit | Attending: Family Medicine | Admitting: Family Medicine

## 2013-01-13 DIAGNOSIS — Z1231 Encounter for screening mammogram for malignant neoplasm of breast: Secondary | ICD-10-CM | POA: Insufficient documentation

## 2013-01-13 DIAGNOSIS — Z139 Encounter for screening, unspecified: Secondary | ICD-10-CM

## 2013-11-09 ENCOUNTER — Other Ambulatory Visit (HOSPITAL_COMMUNITY): Payer: Self-pay | Admitting: Family Medicine

## 2013-11-09 DIAGNOSIS — Z139 Encounter for screening, unspecified: Secondary | ICD-10-CM

## 2014-01-16 ENCOUNTER — Ambulatory Visit (HOSPITAL_COMMUNITY): Payer: BC Managed Care – PPO

## 2014-01-18 ENCOUNTER — Ambulatory Visit (HOSPITAL_COMMUNITY)
Admission: RE | Admit: 2014-01-18 | Discharge: 2014-01-18 | Disposition: A | Discharge status: Home / Self Care | Payer: BC Managed Care – PPO | Source: Ambulatory Visit | Attending: Family Medicine | Admitting: Family Medicine

## 2014-01-18 DIAGNOSIS — Z1231 Encounter for screening mammogram for malignant neoplasm of breast: Secondary | ICD-10-CM | POA: Insufficient documentation

## 2014-01-18 DIAGNOSIS — Z139 Encounter for screening, unspecified: Secondary | ICD-10-CM

## 2014-10-03 ENCOUNTER — Ambulatory Visit (HOSPITAL_COMMUNITY)
Admission: RE | Admit: 2014-10-03 | Discharge: 2014-10-03 | Disposition: A | Discharge status: Home / Self Care | Payer: BC Managed Care – PPO | Source: Ambulatory Visit | Attending: Family Medicine | Admitting: Family Medicine

## 2014-10-03 ENCOUNTER — Other Ambulatory Visit (HOSPITAL_COMMUNITY): Payer: Self-pay | Admitting: Family Medicine

## 2014-10-03 DIAGNOSIS — R05 Cough: Secondary | ICD-10-CM

## 2014-10-03 DIAGNOSIS — R0789 Other chest pain: Secondary | ICD-10-CM | POA: Diagnosis not present

## 2014-10-03 DIAGNOSIS — R059 Cough, unspecified: Secondary | ICD-10-CM

## 2014-10-03 DIAGNOSIS — Z87891 Personal history of nicotine dependence: Secondary | ICD-10-CM | POA: Diagnosis not present

## 2014-10-03 DIAGNOSIS — T464X5A Adverse effect of angiotensin-converting-enzyme inhibitors, initial encounter: Secondary | ICD-10-CM

## 2014-10-20 ENCOUNTER — Other Ambulatory Visit (HOSPITAL_COMMUNITY): Payer: Self-pay | Admitting: Family Medicine

## 2014-10-20 DIAGNOSIS — Z1231 Encounter for screening mammogram for malignant neoplasm of breast: Secondary | ICD-10-CM

## 2015-01-22 ENCOUNTER — Ambulatory Visit (HOSPITAL_COMMUNITY)
Admission: RE | Admit: 2015-01-22 | Discharge: 2015-01-22 | Disposition: A | Discharge status: Home / Self Care | Payer: BC Managed Care – PPO | Source: Ambulatory Visit | Attending: Family Medicine | Admitting: Family Medicine

## 2015-01-22 DIAGNOSIS — Z1231 Encounter for screening mammogram for malignant neoplasm of breast: Secondary | ICD-10-CM | POA: Diagnosis present

## 2015-12-03 DIAGNOSIS — S139XXA Sprain of joints and ligaments of unspecified parts of neck, initial encounter: Secondary | ICD-10-CM | POA: Diagnosis not present

## 2016-01-04 ENCOUNTER — Other Ambulatory Visit (HOSPITAL_COMMUNITY): Payer: Self-pay | Admitting: Family Medicine

## 2016-01-04 DIAGNOSIS — Z1231 Encounter for screening mammogram for malignant neoplasm of breast: Secondary | ICD-10-CM

## 2016-01-23 ENCOUNTER — Ambulatory Visit (HOSPITAL_COMMUNITY)
Admission: RE | Admit: 2016-01-23 | Discharge: 2016-01-23 | Disposition: A | Discharge status: Home / Self Care | Payer: Medicare Other | Source: Ambulatory Visit | Attending: Family Medicine | Admitting: Family Medicine

## 2016-01-23 DIAGNOSIS — Z1231 Encounter for screening mammogram for malignant neoplasm of breast: Secondary | ICD-10-CM | POA: Insufficient documentation

## 2016-04-08 DIAGNOSIS — J069 Acute upper respiratory infection, unspecified: Secondary | ICD-10-CM | POA: Diagnosis not present

## 2016-04-24 DIAGNOSIS — J069 Acute upper respiratory infection, unspecified: Secondary | ICD-10-CM | POA: Diagnosis not present

## 2016-04-24 DIAGNOSIS — Z6841 Body Mass Index (BMI) 40.0 and over, adult: Secondary | ICD-10-CM | POA: Diagnosis not present

## 2016-04-24 DIAGNOSIS — J209 Acute bronchitis, unspecified: Secondary | ICD-10-CM | POA: Diagnosis not present

## 2016-05-08 DIAGNOSIS — Z23 Encounter for immunization: Secondary | ICD-10-CM | POA: Diagnosis not present

## 2016-10-22 DIAGNOSIS — Z23 Encounter for immunization: Secondary | ICD-10-CM | POA: Diagnosis not present

## 2016-10-22 DIAGNOSIS — Z6841 Body Mass Index (BMI) 40.0 and over, adult: Secondary | ICD-10-CM | POA: Diagnosis not present

## 2016-10-22 DIAGNOSIS — D1724 Benign lipomatous neoplasm of skin and subcutaneous tissue of left leg: Secondary | ICD-10-CM | POA: Diagnosis not present

## 2016-10-22 DIAGNOSIS — R0789 Other chest pain: Secondary | ICD-10-CM | POA: Diagnosis not present

## 2016-10-22 DIAGNOSIS — E782 Mixed hyperlipidemia: Secondary | ICD-10-CM | POA: Diagnosis not present

## 2016-10-22 DIAGNOSIS — I10 Essential (primary) hypertension: Secondary | ICD-10-CM | POA: Diagnosis not present

## 2016-10-22 DIAGNOSIS — E559 Vitamin D deficiency, unspecified: Secondary | ICD-10-CM | POA: Diagnosis not present

## 2016-10-22 DIAGNOSIS — J029 Acute pharyngitis, unspecified: Secondary | ICD-10-CM | POA: Diagnosis not present

## 2016-10-23 DIAGNOSIS — R739 Hyperglycemia, unspecified: Secondary | ICD-10-CM | POA: Diagnosis not present

## 2016-10-23 DIAGNOSIS — E782 Mixed hyperlipidemia: Secondary | ICD-10-CM | POA: Diagnosis not present

## 2016-10-24 ENCOUNTER — Encounter: Payer: Self-pay | Admitting: *Deleted

## 2016-11-04 ENCOUNTER — Encounter: Payer: Self-pay | Admitting: Cardiovascular Disease

## 2016-11-04 ENCOUNTER — Encounter: Payer: Self-pay | Admitting: *Deleted

## 2016-11-04 ENCOUNTER — Telehealth: Payer: Self-pay | Admitting: Cardiovascular Disease

## 2016-11-04 ENCOUNTER — Ambulatory Visit (INDEPENDENT_AMBULATORY_CARE_PROVIDER_SITE_OTHER): Payer: Medicare Other | Admitting: Cardiovascular Disease

## 2016-11-04 VITALS — BP 118/88 | HR 88 | Ht 62.0 in | Wt 212.0 lb

## 2016-11-04 DIAGNOSIS — R079 Chest pain, unspecified: Secondary | ICD-10-CM

## 2016-11-04 NOTE — Progress Notes (Signed)
CARDIOLOGY CONSULT NOTE  Patient ID: Chelsey Johnson MRN: [id number] DOB/AGE: [birth year] 66 y.o.  Admit date: (Not on file) Primary Physician: Sharilyn Sites, MD Referring Physician: Hilma Favors  Reason for Consultation: chest pain  HPI: Chelsey Johnson is a 66 y.o. female who is being seen today for the evaluation of chest pain at the request of Sharilyn Sites, MD.   Labs 10/22/16: Hemoglobin 13.3, platelets 209, BUN 15, creatinine 0.76, total cholesterol 169, triglycerides 54, HDL 78, LDL 80.  I personally reviewed the ECG performed on 10/22/16 which demonstrated normal sinus rhythm with no ischemic ST segment or T-wave abnormalities, nor any arrhythmias.  Her mother passed away in late Sep 06, 2016. She experienced chest pain on that day. She has had one additional episode lasting seconds. She denies exertional chest pain and shortness of breath. She usually experiences belching after these episodes. She occasionally has bilateral chest soreness after lifting heavy objects.  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   Allergies  Allergen Reactions  . Anesthetics, Amide Palpitations    Pt reports Tachycardia w/ HRs 170's with General Anesthesia but cannot give details regarding names of medications.  Roselee Culver [Oxycodone-Acetaminophen] Palpitations    Tachycardia     Current Outpatient Prescriptions  Medication Sig Dispense Refill  . aspirin EC 81 MG tablet Take 81 mg by mouth daily.    Marland Kitchen estradiol (ESTRACE) 1 MG tablet Take 0.5 mg by mouth daily.     . furosemide (LASIX) 20 MG tablet Take 20 mg by mouth.    . Moexipril-Hydrochlorothiazide 15-12.5 MG TABS Take by mouth daily.     . OMEGA 3 1200 MG CAPS Take 1 capsule by mouth daily.    . Vitamin D, Ergocalciferol, (DRISDOL) 50000 units CAPS capsule Take 50,000 Units by mouth every 7 (seven) days.     No current facility-administered medications for this visit.     Past Medical History:    Diagnosis Date  . Anesthesia complication    tachycardia  . Complication of anesthesia    blood pressure drops quickly with anesthetic  . HTN (hypertension)   . Obesity     Past Surgical History:  Procedure Laterality Date  . ABDOMINAL HYSTERECTOMY  1986   complete, uterine fibroids  . BREAST BIOPSY    . COLONOSCOPY  09/19/2011   Procedure: COLONOSCOPY;  Surgeon: Danie Binder, MD;  Location: [facility];  Service: Endoscopy;  Laterality: N/A;  12:30    Social History   Social History  . Marital status: Single    Spouse name: N/A  . Number of children: 0  . Years of education: N/A   Occupational History  . [employer]   Social History Main Topics  . Smoking status: Former Smoker    Packs/day: 1.00    Types: Cigarettes    Quit date: 09/11/1991  . Smokeless tobacco: Never Used     Comment: quit about 20 + years  . Alcohol use No  . Drug use: No  . Sexual activity: Not on file   Other Topics Concern  . Not on file   Social History Narrative   Lives w/ mother     No family history of premature CAD in 1st degree relatives.  Current Meds  Medication Sig  . aspirin EC 81 MG tablet Take 81 mg by mouth daily.  Marland Kitchen estradiol (ESTRACE) 1 MG tablet Take  0.5 mg by mouth daily.   . furosemide (LASIX) 20 MG tablet Take 20 mg by mouth.  . Moexipril-Hydrochlorothiazide 15-12.5 MG TABS Take by mouth daily.   . OMEGA 3 1200 MG CAPS Take 1 capsule by mouth daily.  . Vitamin D, Ergocalciferol, (DRISDOL) 50000 units CAPS capsule Take 50,000 Units by mouth every 7 (seven) days.      Review of systems complete and found to be negative unless listed above in HPI    Physical exam Blood pressure 118/88, pulse 88, height 5\' 2"  (1.575 m), weight 212 lb (96.2 kg), SpO2 96 %. General: NAD Neck: No JVD, no thyromegaly or thyroid nodule.  Lungs: Clear to auscultation bilaterally with normal respiratory effort. CV: Nondisplaced PMI.  Regular rate and rhythm, normal S1/S2, no S3/S4, no murmur.  No peripheral edema.  No carotid bruit.    Abdomen: Soft, nontender, no distention.  Skin: Intact without lesions or rashes.  Neurologic: Alert and oriented x 3.  Psych: Normal affect. Extremities: No clubbing or cyanosis.  HEENT: Normal.   ECG: Most recent ECG reviewed.   Labs: No results found for: K, BUN, CREATININE, ALT, TSH, HGB   Lipids: No results found for: LDLCALC, LDLDIRECT, CHOL, TRIG, HDL      ASSESSMENT AND PLAN:  1. Chest pain: Primarily atypical symptoms for ischemic heart disease. I will obtain an exercise treadmill stress test for further clarification  Disposition: Follow up to be determined.  Signed: Kate Sable, M.D., F.A.C.C.  11/04/2016, 2:37 PM

## 2016-11-04 NOTE — Telephone Encounter (Signed)
GXT - chest pain Scheduled at [facility] Nov 12, 2016 Arrive at 10:30

## 2016-11-04 NOTE — Patient Instructions (Signed)
Medication Instructions:  Continue all current medications.  Labwork: none  Testing/Procedures:  Your physician has requested that you have an exercise tolerance test. For further information please visit [email or web address]. Please also follow instruction sheet, as given.  Office will contact with results via phone or letter.    Follow-Up: To be determined   Any Other Special Instructions Will Be Listed Below (If Applicable).  If you need a refill on your cardiac medications before your next appointment, please call your pharmacy.

## 2016-11-12 ENCOUNTER — Ambulatory Visit (HOSPITAL_COMMUNITY)
Admission: RE | Admit: 2016-11-12 | Discharge: 2016-11-12 | Disposition: A | Discharge status: Home / Self Care | Payer: Medicare Other | Source: Ambulatory Visit | Attending: Cardiovascular Disease | Admitting: Cardiovascular Disease

## 2016-11-12 DIAGNOSIS — R079 Chest pain, unspecified: Secondary | ICD-10-CM | POA: Diagnosis not present

## 2016-11-12 LAB — EXERCISE TOLERANCE TEST
CSEPED: 6 min
CSEPEW: 7.2 METS
Exercise duration (sec): 5 s
MPHR: 155 {beats}/min
Peak HR: 162 {beats}/min
Percent HR: 104 %
RPE: 13
Rest HR: 90 {beats}/min

## 2017-01-01 ENCOUNTER — Other Ambulatory Visit (HOSPITAL_COMMUNITY): Payer: Self-pay | Admitting: Family Medicine

## 2017-01-01 ENCOUNTER — Ambulatory Visit (HOSPITAL_COMMUNITY)
Admission: RE | Admit: 2017-01-01 | Discharge: 2017-01-01 | Disposition: A | Discharge status: Home / Self Care | Payer: Medicare Other | Source: Ambulatory Visit | Attending: Family Medicine | Admitting: Family Medicine

## 2017-01-01 DIAGNOSIS — Z1231 Encounter for screening mammogram for malignant neoplasm of breast: Secondary | ICD-10-CM

## 2017-01-23 ENCOUNTER — Ambulatory Visit (HOSPITAL_COMMUNITY)
Admission: RE | Admit: 2017-01-23 | Discharge: 2017-01-23 | Disposition: A | Discharge status: Home / Self Care | Payer: Medicare Other | Source: Ambulatory Visit | Attending: Family Medicine | Admitting: Family Medicine

## 2017-01-23 ENCOUNTER — Encounter (HOSPITAL_COMMUNITY): Payer: Self-pay

## 2017-01-23 ENCOUNTER — Encounter (HOSPITAL_COMMUNITY): Payer: Medicare Other

## 2017-01-23 DIAGNOSIS — Z23 Encounter for immunization: Secondary | ICD-10-CM | POA: Diagnosis not present

## 2017-01-23 DIAGNOSIS — Z1231 Encounter for screening mammogram for malignant neoplasm of breast: Secondary | ICD-10-CM | POA: Insufficient documentation

## 2017-02-23 DIAGNOSIS — Z1389 Encounter for screening for other disorder: Secondary | ICD-10-CM | POA: Diagnosis not present

## 2017-02-23 DIAGNOSIS — I10 Essential (primary) hypertension: Secondary | ICD-10-CM | POA: Diagnosis not present

## 2017-02-23 DIAGNOSIS — E782 Mixed hyperlipidemia: Secondary | ICD-10-CM | POA: Diagnosis not present

## 2017-02-23 DIAGNOSIS — H6502 Acute serous otitis media, left ear: Secondary | ICD-10-CM | POA: Diagnosis not present

## 2017-02-23 DIAGNOSIS — Z0001 Encounter for general adult medical examination with abnormal findings: Secondary | ICD-10-CM | POA: Diagnosis not present

## 2017-02-23 DIAGNOSIS — Z6841 Body Mass Index (BMI) 40.0 and over, adult: Secondary | ICD-10-CM | POA: Diagnosis not present

## 2017-02-23 DIAGNOSIS — R739 Hyperglycemia, unspecified: Secondary | ICD-10-CM | POA: Diagnosis not present

## 2017-02-23 DIAGNOSIS — E559 Vitamin D deficiency, unspecified: Secondary | ICD-10-CM | POA: Diagnosis not present

## 2017-04-14 ENCOUNTER — Other Ambulatory Visit (HOSPITAL_COMMUNITY): Payer: Self-pay | Admitting: Family Medicine

## 2017-04-14 ENCOUNTER — Ambulatory Visit (HOSPITAL_COMMUNITY)
Admission: RE | Admit: 2017-04-14 | Discharge: 2017-04-14 | Disposition: A | Discharge status: Home / Self Care | Payer: Medicare Other | Source: Ambulatory Visit | Attending: Family Medicine | Admitting: Family Medicine

## 2017-04-14 DIAGNOSIS — M79672 Pain in left foot: Secondary | ICD-10-CM | POA: Diagnosis not present

## 2017-11-19 DIAGNOSIS — Z6841 Body Mass Index (BMI) 40.0 and over, adult: Secondary | ICD-10-CM | POA: Diagnosis not present

## 2017-11-19 DIAGNOSIS — E748 Other specified disorders of carbohydrate metabolism: Secondary | ICD-10-CM | POA: Diagnosis not present

## 2017-11-19 DIAGNOSIS — E559 Vitamin D deficiency, unspecified: Secondary | ICD-10-CM | POA: Diagnosis not present

## 2017-11-19 DIAGNOSIS — E782 Mixed hyperlipidemia: Secondary | ICD-10-CM | POA: Diagnosis not present

## 2017-11-19 DIAGNOSIS — Z0000 Encounter for general adult medical examination without abnormal findings: Secondary | ICD-10-CM | POA: Diagnosis not present

## 2017-11-19 DIAGNOSIS — Z1389 Encounter for screening for other disorder: Secondary | ICD-10-CM | POA: Diagnosis not present

## 2017-12-28 ENCOUNTER — Other Ambulatory Visit (HOSPITAL_COMMUNITY): Payer: Self-pay | Admitting: Family Medicine

## 2017-12-28 DIAGNOSIS — Z1231 Encounter for screening mammogram for malignant neoplasm of breast: Secondary | ICD-10-CM

## 2018-01-25 ENCOUNTER — Ambulatory Visit (HOSPITAL_COMMUNITY): Payer: Medicare Other

## 2018-01-28 ENCOUNTER — Ambulatory Visit (HOSPITAL_COMMUNITY)
Admission: RE | Admit: 2018-01-28 | Discharge: 2018-01-28 | Disposition: A | Discharge status: Home / Self Care | Payer: Medicare Other | Source: Ambulatory Visit | Attending: Family Medicine | Admitting: Family Medicine

## 2018-01-28 DIAGNOSIS — Z1231 Encounter for screening mammogram for malignant neoplasm of breast: Secondary | ICD-10-CM | POA: Insufficient documentation

## 2018-03-31 DIAGNOSIS — Z923 Personal history of irradiation: Secondary | ICD-10-CM

## 2018-03-31 DIAGNOSIS — C50919 Malignant neoplasm of unspecified site of unspecified female breast: Secondary | ICD-10-CM

## 2018-03-31 HISTORY — PX: BREAST BIOPSY: SHX20

## 2018-03-31 HISTORY — DX: Malignant neoplasm of unspecified site of unspecified female breast: C50.919

## 2018-03-31 HISTORY — DX: Personal history of irradiation: Z92.3

## 2018-09-23 DIAGNOSIS — E559 Vitamin D deficiency, unspecified: Secondary | ICD-10-CM | POA: Diagnosis not present

## 2018-09-23 DIAGNOSIS — Z79899 Other long term (current) drug therapy: Secondary | ICD-10-CM | POA: Diagnosis not present

## 2018-12-27 ENCOUNTER — Other Ambulatory Visit (HOSPITAL_COMMUNITY): Payer: Self-pay | Admitting: Family Medicine

## 2018-12-27 DIAGNOSIS — Z1231 Encounter for screening mammogram for malignant neoplasm of breast: Secondary | ICD-10-CM

## 2019-01-31 ENCOUNTER — Other Ambulatory Visit: Payer: Self-pay

## 2019-01-31 ENCOUNTER — Ambulatory Visit (HOSPITAL_COMMUNITY)
Admission: RE | Admit: 2019-01-31 | Discharge: 2019-01-31 | Disposition: A | Discharge status: Home / Self Care | Payer: Medicare Other | Source: Ambulatory Visit | Attending: Family Medicine | Admitting: Family Medicine

## 2019-01-31 DIAGNOSIS — Z1231 Encounter for screening mammogram for malignant neoplasm of breast: Secondary | ICD-10-CM | POA: Insufficient documentation

## 2019-02-02 ENCOUNTER — Other Ambulatory Visit (HOSPITAL_COMMUNITY): Payer: Self-pay | Admitting: Family Medicine

## 2019-02-02 DIAGNOSIS — R928 Other abnormal and inconclusive findings on diagnostic imaging of breast: Secondary | ICD-10-CM

## 2019-02-08 ENCOUNTER — Ambulatory Visit (HOSPITAL_COMMUNITY)
Admission: RE | Admit: 2019-02-08 | Discharge: 2019-02-08 | Disposition: A | Discharge status: Home / Self Care | Payer: Medicare Other | Source: Ambulatory Visit | Attending: Family Medicine | Admitting: Family Medicine

## 2019-02-08 ENCOUNTER — Other Ambulatory Visit (HOSPITAL_COMMUNITY): Payer: Self-pay | Admitting: Family Medicine

## 2019-02-08 ENCOUNTER — Other Ambulatory Visit: Payer: Self-pay

## 2019-02-08 DIAGNOSIS — R928 Other abnormal and inconclusive findings on diagnostic imaging of breast: Secondary | ICD-10-CM | POA: Insufficient documentation

## 2019-02-15 ENCOUNTER — Other Ambulatory Visit (HOSPITAL_COMMUNITY): Payer: Self-pay | Admitting: Family Medicine

## 2019-02-15 ENCOUNTER — Ambulatory Visit (HOSPITAL_COMMUNITY)
Admission: RE | Admit: 2019-02-15 | Discharge: 2019-02-15 | Disposition: A | Discharge status: Home / Self Care | Payer: Medicare Other | Source: Ambulatory Visit | Attending: Family Medicine | Admitting: Family Medicine

## 2019-02-15 ENCOUNTER — Other Ambulatory Visit: Payer: Self-pay

## 2019-02-15 DIAGNOSIS — D0511 Intraductal carcinoma in situ of right breast: Secondary | ICD-10-CM | POA: Insufficient documentation

## 2019-02-15 DIAGNOSIS — R928 Other abnormal and inconclusive findings on diagnostic imaging of breast: Secondary | ICD-10-CM

## 2019-02-15 DIAGNOSIS — C50911 Malignant neoplasm of unspecified site of right female breast: Secondary | ICD-10-CM | POA: Diagnosis not present

## 2019-02-15 MED ORDER — LIDOCAINE HCL (PF) 2 % IJ SOLN
INTRAMUSCULAR | Status: AC
  Filled 2019-02-15: qty 10

## 2019-02-22 ENCOUNTER — Ambulatory Visit: Payer: Self-pay | Admitting: General Surgery

## 2019-02-23 LAB — SURGICAL PATHOLOGY

## 2019-03-03 ENCOUNTER — Other Ambulatory Visit: Payer: Self-pay

## 2019-03-03 ENCOUNTER — Other Ambulatory Visit (HOSPITAL_COMMUNITY): Payer: Self-pay | Admitting: General Surgery

## 2019-03-03 ENCOUNTER — Ambulatory Visit (INDEPENDENT_AMBULATORY_CARE_PROVIDER_SITE_OTHER): Payer: Medicare Other | Admitting: General Surgery

## 2019-03-03 ENCOUNTER — Encounter: Payer: Self-pay | Admitting: General Surgery

## 2019-03-03 VITALS — BP 128/85 | HR 78 | Temp 96.6°F | Resp 16 | Ht 62.5 in | Wt 218.4 lb

## 2019-03-03 DIAGNOSIS — D0511 Intraductal carcinoma in situ of right breast: Secondary | ICD-10-CM | POA: Diagnosis not present

## 2019-03-03 NOTE — Progress Notes (Addendum)
Chelsey Johnson; [id number]; 14-Aug-1950   HPI Patient is a 68 year old black female who was referred to my care by Dr. Hilma Favors and the breast center for evaluation treatment of ductal carcinoma in situ of the right breast.  This was found on routine screening mammography.  A core biopsy reveals ductal carcinoma in situ of moderate grade.  Pathology reveals an ER/PR positive tumor.  Patient does not feel a lump.  Interestingly, she has an older sister who was diagnosed with breast cancer and ultimately underwent bilateral mastectomies.  She did have a biopsy by myself in 2004 of the right breast which was negative for cancer.  She denies any nipple discharge.  She currently has 0 out of 10 breast pain. Past Medical History:  Diagnosis Date  . Anesthesia complication    tachycardia  . Complication of anesthesia    blood pressure drops quickly with anesthetic  . HTN (hypertension)   . Obesity     Past Surgical History:  Procedure Laterality Date  . ABDOMINAL HYSTERECTOMY  1986   complete, uterine fibroids  . BREAST BIOPSY Right    negative  . COLONOSCOPY  09/19/2011   Procedure: COLONOSCOPY;  Surgeon: Danie Binder, MD;  Location: [facility];  Service: Endoscopy;  Laterality: N/A;  12:30    Family History  Problem Relation Age of Onset  . Ulcers Brother   . Colon polyps Mother 93    Current Outpatient Medications on File Prior to Visit  Medication Sig Dispense Refill  . aspirin EC 81 MG tablet Take 81 mg by mouth daily.    Marland Kitchen estradiol (ESTRACE) 1 MG tablet Take 0.5 mg by mouth daily.     . Moexipril-Hydrochlorothiazide 15-12.5 MG TABS Take by mouth daily.     . OMEGA 3 1200 MG CAPS Take 1 capsule by mouth daily.    . vitamin B-12 (CYANOCOBALAMIN) 500 MCG tablet Take 500 mcg by mouth daily.    . Vitamin D, Ergocalciferol, (DRISDOL) 50000 units CAPS capsule Take 50,000 Units by mouth every 7 (seven) days.     No current facility-administered medications on file prior to visit.      Allergies  Allergen Reactions  . Anesthetics, Amide Palpitations    Pt reports Tachycardia w/ HRs 170's with General Anesthesia but cannot give details regarding names of medications.  Roselee Culver [Oxycodone-Acetaminophen] Palpitations    Tachycardia     Social History   Substance and Sexual Activity  Alcohol Use No    Social History   Tobacco Use  Smoking Status Former Smoker  . Packs/day: 1.00  . Types: Cigarettes  . Quit date: 09/11/1991  . Years since quitting: 27.4  Smokeless Tobacco Never Used  Tobacco Comment   quit about 20 + years    Review of Systems  Constitutional: Negative.   HENT: Positive for sinus pain.   Eyes: Negative.   Respiratory: Negative.   Cardiovascular: Negative.   Gastrointestinal: Negative.   Genitourinary: Negative.   Musculoskeletal: Negative.   Skin: Negative.   Neurological: Negative.   Endo/Heme/Allergies: Negative.   Psychiatric/Behavioral: Negative.     Objective   Vitals:   03/03/19 0925  BP: 128/85  Pulse: 78  Resp: 16  Temp: (!) 96.6 F (35.9 C)  SpO2: 97%    Physical Exam Vitals signs reviewed.  Constitutional:      Appearance: Normal appearance. She is not ill-appearing.  HENT:     Head: Normocephalic and atraumatic.  Cardiovascular:     Rate and  Rhythm: Normal rate and regular rhythm.     Heart sounds: Normal heart sounds. No murmur. No friction rub. No gallop.   Pulmonary:     Effort: Pulmonary effort is normal. No respiratory distress.     Breath sounds: Normal breath sounds. No stridor. No wheezing, rhonchi or rales.  Skin:    General: Skin is warm and dry.  Neurological:     Mental Status: She is alert and oriented to person, place, and time.   Breast: No dominant mass, nipple discharge, or dimpling in either breast.  A healed surgical scar is noted in the outer, upper quadrant of the right breast.  Axillas are negative for palpable nodes.  Radiologic and pathology notes all reviewed  Assessment   Ductal carcinoma in situ, right breast, 4 mm in size, ER/PR positive Plan   I discussed multiple surgical options with the patient including a right simple mastectomy versus right partial mastectomy with postoperative radiation therapy.  Patient has already decided to undergo a right partial mastectomy with postoperative radiation therapy.  She does realize that she will be referred to oncology after the surgery for further evaluation and treatment.  Patient is scheduled for right partial mastectomy after needle localization on 03/09/2019.  The risks and benefits of the procedure including bleeding and infection were fully explained to the patient, who gave informed consent.  Patient was instructed to stop her estrogen supplements.

## 2019-03-03 NOTE — H&P (Signed)
Chelsey Johnson; [id number]; [birth year]   HPI Patient is a 68 year old black female who was referred to my care by Dr. Hilma Favors and the breast center for evaluation treatment of ductal carcinoma in situ of the right breast.  This was found on routine screening mammography.  A core biopsy reveals ductal carcinoma in situ of moderate grade.  Pathology reveals an ER/PR positive tumor.  Patient does not feel a lump.  Interestingly, she has an older sister who was diagnosed with breast cancer and ultimately underwent bilateral mastectomies.  She did have a biopsy by myself in 2004 of the right breast which was negative for cancer.  She denies any nipple discharge.  She currently has 0 out of 10 breast pain. Past Medical History:  Diagnosis Date  . Anesthesia complication    tachycardia  . Complication of anesthesia    blood pressure drops quickly with anesthetic  . HTN (hypertension)   . Obesity     Past Surgical History:  Procedure Laterality Date  . ABDOMINAL HYSTERECTOMY  1986   complete, uterine fibroids  . BREAST BIOPSY Right    negative  . COLONOSCOPY  09/19/2011   Procedure: COLONOSCOPY;  Surgeon: Danie Binder, MD;  Location: [facility];  Service: Endoscopy;  Laterality: N/A;  12:30    Family History  Problem Relation Age of Onset  . Ulcers Brother   . Colon polyps Mother 61    Current Outpatient Medications on File Prior to Visit  Medication Sig Dispense Refill  . aspirin EC 81 MG tablet Take 81 mg by mouth daily.    Marland Kitchen estradiol (ESTRACE) 1 MG tablet Take 0.5 mg by mouth daily.     . Moexipril-Hydrochlorothiazide 15-12.5 MG TABS Take by mouth daily.     . OMEGA 3 1200 MG CAPS Take 1 capsule by mouth daily.    . vitamin B-12 (CYANOCOBALAMIN) 500 MCG tablet Take 500 mcg by mouth daily.    . Vitamin D, Ergocalciferol, (DRISDOL) 50000 units CAPS capsule Take 50,000 Units by mouth every 7 (seven) days.     No current facility-administered medications on file prior to visit.      Allergies  Allergen Reactions  . Anesthetics, Amide Palpitations    Pt reports Tachycardia w/ HRs 170's with General Anesthesia but cannot give details regarding names of medications.  Roselee Culver [Oxycodone-Acetaminophen] Palpitations    Tachycardia     Social History   Substance and Sexual Activity  Alcohol Use No    Social History   Tobacco Use  Smoking Status Former Smoker  . Packs/day: 1.00  . Types: Cigarettes  . Quit date: 09/11/1991  . Years since quitting: 27.4  Smokeless Tobacco Never Used  Tobacco Comment   quit about 20 + years    Review of Systems  Constitutional: Negative.   HENT: Positive for sinus pain.   Eyes: Negative.   Respiratory: Negative.   Cardiovascular: Negative.   Gastrointestinal: Negative.   Genitourinary: Negative.   Musculoskeletal: Negative.   Skin: Negative.   Neurological: Negative.   Endo/Heme/Allergies: Negative.   Psychiatric/Behavioral: Negative.     Objective   Vitals:   03/03/19 0925  BP: 128/85  Pulse: 78  Resp: 16  Temp: (!) 96.6 F (35.9 C)  SpO2: 97%    Physical Exam Vitals signs reviewed.  Constitutional:      Appearance: Normal appearance. She is not ill-appearing.  HENT:     Head: Normocephalic and atraumatic.  Cardiovascular:     Rate and  Rhythm: Normal rate and regular rhythm.     Heart sounds: Normal heart sounds. No murmur. No friction rub. No gallop.   Pulmonary:     Effort: Pulmonary effort is normal. No respiratory distress.     Breath sounds: Normal breath sounds. No stridor. No wheezing, rhonchi or rales.  Skin:    General: Skin is warm and dry.  Neurological:     Mental Status: She is alert and oriented to person, place, and time.   Breast: No dominant mass, nipple discharge, or dimpling in either breast.  A healed surgical scar is noted in the outer, upper quadrant of the right breast.  Axillas are negative for palpable nodes.  Radiologic and pathology notes all reviewed  Assessment   Ductal carcinoma in situ, right breast, 4 mm in size, ER/PR positive Plan   I discussed multiple surgical options with the patient including a right simple mastectomy versus right partial mastectomy with postoperative radiation therapy.  Patient has already decided to undergo a right partial mastectomy with postoperative radiation therapy.  She does realize that she will be referred to oncology after the surgery for further evaluation and treatment.  Patient is scheduled for right partial mastectomy after needle localization on 03/09/2019.  The risks and benefits of the procedure including bleeding and infection were fully explained to the patient, who gave informed consent.

## 2019-03-03 NOTE — Patient Instructions (Addendum)
Surgery scheduled 03/09/2019. Pre-op scheduled 03/07/2019 @ 2:45 am. Covid Test scheduled 03/08/2019 @ 9:05 am.    Lumpectomy  A lumpectomy, sometimes called a partial mastectomy, is surgery to remove a cancerous tumor or mass (the lump) from a breast. It is a form of "breast conserving" or "breast preservation" surgery. This means that the cancerous tissue is removed but the breast remains intact. During a lumpectomy, the portion of the breast that contains the tumor is removed. Some normal tissue around the lump may be taken out to make sure that all of the tumor has been removed. Lymph nodes under your arm may also be removed and tested to find out if the cancer has spread. Lymph nodes are part of the body's disease-fighting system (immune system) and are usually the first place where breast cancer spreads. Tell a health care provider about:  Any allergies you have.  All medicines you are taking, including vitamins, herbs, eye drops, creams, and over-the-counter medicines.  Any problems you or family members have had with anesthetic medicines.  Any blood disorders you have.  Any surgeries you have had.  Any medical conditions you have.  Whether you are pregnant or may be pregnant. What are the risks? Generally, this is a safe procedure. However, problems may occur, including:  Bleeding.  Infection.  Allergic reaction to medicines.  Pain, swelling, weakness, or numbness in the arm on the side of your surgery.  Temporary swelling.  Change in the shape of the breast, particularly if a large portion is removed.  Scar tissue that forms at the surgical site and feels hard to the touch. What happens before the procedure? Medicines  Ask your health care provider about: ? Changing or stopping your regular medicines. This is especially important if you are taking diabetes medicines or blood thinners. ? Taking medicines such as aspirin and ibuprofen. These medicines can thin your  blood. Do not take these medicines before your procedure if your health care provider instructs you not to.  You may be given antibiotic medicine to help prevent infection. General instructions  Prior to surgery, your health care provider may do a procedure to locate and Chelsey Johnson the tumor area in your breast (localization). This procedure will help guide your surgeon to where the incision will be made. This may be done with: ? Imaging. This may include a mammogram, ultrasound, or MRI. ? Insertion of a small wire, clip, seed, or radar reflector implant.  You may be screened for extra fluid around the lymph nodes (lymphedema).  Ask your health care provider how your surgical site will be marked or identified.  Plan to have someone take you home from the hospital or clinic. What happens during the procedure?   To lower your risk of infection: ? Your health care team will wash or sanitize their hands. ? Your skin will be washed with soap.  An IV will be inserted into one of your veins.  You will be given one or more of the following: ? A medicine to help you relax (sedative). ? A medicine to numb the area (local anesthetic). ? A medicine to make you fall asleep (general anesthetic).  Your health care provider will use a kind of electric scalpel that uses heat to minimize bleeding (electrocautery knife). A curved incision that follows the natural curve of your breast will be made. This type of incision will allow for minimal scarring and better healing.  The tumor will be removed along with some of the surrounding  tissue. This will be sent to the lab for testing. Your health care provider may also remove lymph nodes at this time if needed.  If the tumor is close to the muscles over your chest, some muscle tissue may also be removed.  A small drain tube may be inserted into your breast area or armpit to collect fluid that may build up after surgery. This tube will be connected to a suction  bulb on the outside of your body to remove the fluid.  The incision will be closed with stitches (sutures).  A bandage (dressing) may be placed over the incision. The procedure may vary among health care providers and hospitals. What happens after the procedure?  Your blood pressure, heart rate, breathing rate, and blood oxygen level will be monitored until you leave the hospital or clinic.  You will be given medicine for pain as needed.  Your IV will be removed when you are able to eat and drink by mouth.  You will be encouraged to get up and walk as soon as you can. This is important to improve blood flow and breathing. Ask for help if you feel weak or unsteady.  You may have a drain tube in place for 2-3 days to prevent a collection of blood (hematoma) from developing in the breast. You will be given instructions about caring for the drain before you go home.  A pressure bandage may be applied for 1-2 days to prevent bleeding or swelling. Your pressure bandage may look like a thick piece of fabric or an elastic wrap. Ask your health care provider how to care for your bandage at home.  You may be given a tight sleeve to wear over your arm on the side of your surgery. You should wear this sleeve as told by your health care provider.  Do not drive for 24 hours if you were given a sedative during your procedure. Summary  A lumpectomy, sometimes called a partial mastectomy, is surgery to remove a cancerous tumor or mass (the lump) from a breast.  During a lumpectomy, the portion of the breast that contains the tumor is removed. Some normal tissue around the lump may be taken out to make sure that all of the tumor has been removed. Lymph nodes under your arm may also be removed and tested to find out if the cancer has spread.  You may have a drain tube in place for 2-3 days to prevent a collection of blood (hematoma) from developing in the breast. You will be given instructions about caring  for the drain before you go home.  Plan to have someone take you home from the hospital or clinic. This information is not intended to replace advice given to you by your health care provider. Make sure you discuss any questions you have with your health care provider. Document Released: 04/28/2006 Document Revised: 09/15/2017 Document Reviewed: 11/28/2015 Elsevier Patient Education  2020 [employer].

## 2019-03-04 NOTE — Patient Instructions (Addendum)
Chelsey Johnson  [id number]     @PREFPERIOPPHARMACY @   Your procedure is scheduled on  03/09/2019 .  Report to [facility] at  0800   A.M.  Call this number if you have problems the morning of surgery:  [phone]   Remember:  Do not eat or drink after midnight.                      Take these medicines the morning of surgery with A SIP OF WATER  None    Do not wear jewelry, make-up or nail polish.  Do not wear lotions, powders, or perfumes, or deodorant. Please brush your teeth.  Do not shave 48 hours prior to surgery.  Men may shave face and neck.  Do not bring valuables to the hospital.  [facility] is not responsible for any belongings or valuables.  Contacts, dentures or bridgework may not be worn into surgery.  Leave your suitcase in the car.  After surgery it may be brought to your room.  For patients admitted to the hospital, discharge time will be determined by your treatment team.  Patients discharged the day of surgery will not be allowed to drive home.   Name and phone number of your driver:   family Special instructions:  None  Please read over the following fact sheets that you were given. Anesthesia Post-op Instructions and Care and Recovery After Surgery       Breast Biopsy, Care After These instructions give you information about caring for yourself after your procedure. Your doctor may also give you more specific instructions. Call your doctor if you have any problems or questions after your procedure. What can I expect after the procedure? After your procedure, it is common to have:  Bruising on your breast.  Numbness, tingling, or pain near your biopsy site. Follow these instructions at home: Medicines  Take over-the-counter and prescription medicines only as told by your doctor.  Do not drive for 24 hours if you were given a medicine to help you relax (sedative) during your procedure.  Do not drink alcohol while taking pain  medicine.  Do not drive or use heavy machinery while taking prescription pain medicine. Biopsy site care      Follow instructions from your doctor about how to take care of your cut from surgery (incision) or your puncture area. Make sure you: ? Wash your hands with soap and water before you change your bandage (dressing). If you cannot use soap and water, use hand sanitizer. ? Change your bandage as told by your doctor. ? Leave stitches (sutures), skin glue, or skin tape (adhesive strips) in place. They may need to stay in place for 2 weeks or longer. If tape strips get loose and curl up, you may trim the loose edges. Do not remove tape strips completely unless your doctor says it is okay.  If you have stitches, keep them dry when you take a bath or a shower.  Check your cut or puncture area every day for signs of infection. Check for: ? Redness, swelling, or pain. ? Fluid or blood. ? Warmth. ? Pus or a bad smell.  Protect the biopsy area. Do not let the area get bumped. Activity  If you had a cut during your procedure, avoid activities that could pull your cut open. These include: ? Stretching. ? Reaching over your head. ? Exercise. ? Sports. ? Lifting anything that weighs  more than 3 lb (1.4 kg).  Return to your normal activities as told by your doctor. Ask your doctor what activities are safe for you. Managing pain, stiffness, and swelling If told, put ice on the biopsy site to relieve swelling:  Put ice in a plastic bag.  Place a towel between your skin and the bag.  Leave the ice on for 20 minutes, 2-3 times a day. General instructions  Continue your normal diet.  Wear a good support bra for as long as told by your doctor.  Get checked for extra fluid around your lymph nodes (lymphedema) as often as told by your doctor.  Keep all follow-up visits as told by your doctor. This is important. Contact a doctor if:  You notice any of the following at the biopsy  site: ? More redness, swelling, or pain. ? More fluid or blood coming from the site. ? The site feels warm to the touch. ? Pus or a bad smell coming from the site. ? The site breaks open after the stitches or skin tape strips have been removed.  You have a rash.  You have a fever. Get help right away if:  You have more bleeding from the biopsy site. Get help right away if bleeding is more than a small spot.  You have trouble breathing.  You have red streaks around the biopsy site. Summary  After your procedure, it is common to have bruising, numbness, tingling, or pain near the biopsy site.  Do not drive or use heavy machinery while taking prescription pain medicine.  Wear a good support bra for as long as told by your doctor.  If you had a cut during your procedure, avoid activities that may pull the cut open. Ask your doctor what activities are safe for you. This information is not intended to replace advice given to you by your health care provider. Make sure you discuss any questions you have with your health care provider. Document Released: 01/11/2009 Document Revised: 09/03/2017 Document Reviewed: 09/03/2017 Elsevier Patient Education  2020 [place] After These instructions provide you with information about caring for yourself after your procedure. Your health care provider may also give you more specific instructions. Your treatment has been planned according to current medical practices, but problems sometimes occur. Call your health care provider if you have any problems or questions after your procedure. What can I expect after the procedure? After your procedure, you may:  Feel sleepy for several hours.  Feel clumsy and have poor balance for several hours.  Feel forgetful about what happened after the procedure.  Have poor judgment for several hours.  Feel nauseous or vomit.  Have a sore throat if you had a breathing  tube during the procedure. Follow these instructions at home: For at least 24 hours after the procedure:      Have a responsible adult stay with you. It is important to have someone help care for you until you are awake and alert.  Rest as needed.  Do not: ? Participate in activities in which you could fall or become injured. ? Drive. ? Use heavy machinery. ? Drink alcohol. ? Take sleeping pills or medicines that cause drowsiness. ? Make important decisions or sign legal documents. ? Take care of children on your own. Eating and drinking  Follow the diet that is recommended by your health care provider.  If you vomit, drink water, juice, or soup when you can drink  without vomiting.  Make sure you have little or no nausea before eating solid foods. General instructions  Take over-the-counter and prescription medicines only as told by your health care provider.  If you have sleep apnea, surgery and certain medicines can increase your risk for breathing problems. Follow instructions from your health care provider about wearing your sleep device: ? Anytime you are sleeping, including during daytime naps. ? While taking prescription pain medicines, sleeping medicines, or medicines that make you drowsy.  If you smoke, do not smoke without supervision.  Keep all follow-up visits as told by your health care provider. This is important. Contact a health care provider if:  You keep feeling nauseous or you keep vomiting.  You feel light-headed.  You develop a rash.  You have a fever. Get help right away if:  You have trouble breathing. Summary  For several hours after your procedure, you may feel sleepy and have poor judgment.  Have a responsible adult stay with you for at least 24 hours or until you are awake and alert. This information is not intended to replace advice given to you by your health care provider. Make sure you discuss any questions you have with your health  care provider. Document Released: 07/08/2015 Document Revised: 06/15/2017 Document Reviewed: 07/08/2015 Elsevier Patient Education  2020 [employer]. How to Use Chlorhexidine for Bathing Chlorhexidine gluconate (CHG) is a germ-killing (antiseptic) solution that is used to clean the skin. It can get rid of the bacteria that normally live on the skin and can keep them away for about 24 hours. To clean your skin with CHG, you may be given:  A CHG solution to use in the shower or as part of a sponge bath.  A prepackaged cloth that contains CHG. Cleaning your skin with CHG may help lower the risk for infection:  While you are staying in the intensive care unit of the hospital.  If you have a vascular access, such as a central line, to provide short-term or long-term access to your veins.  If you have a catheter to drain urine from your bladder.  If you are on a ventilator. A ventilator is a machine that helps you breathe by moving air in and out of your lungs.  After surgery. What are the risks? Risks of using CHG include:  A skin reaction.  Hearing loss, if CHG gets in your ears.  Eye injury, if CHG gets in your eyes and is not rinsed out.  The CHG product catching fire. Make sure that you avoid smoking and flames after applying CHG to your skin. Do not use CHG:  If you have a chlorhexidine allergy or have previously reacted to chlorhexidine.  On babies younger than 52 months of age. How to use CHG solution  Use CHG only as told by your health care provider, and follow the instructions on the label.  Use the full amount of CHG as directed. Usually, this is one bottle. During a shower Follow these steps when using CHG solution during a shower (unless your health care provider gives you different instructions): 1. Start the shower. 2. Use your normal soap and shampoo to wash your face and hair. 3. Turn off the shower or move out of the shower stream. 4. Pour the CHG onto a  clean washcloth. Do not use any type of brush or rough-edged sponge. 5. Starting at your neck, lather your body down to your toes. Make sure you follow these instructions: ? If you  will be having surgery, pay special attention to the part of your body where you will be having surgery. Scrub this area for at least 1 minute. ? Do not use CHG on your head or face. If the solution gets into your ears or eyes, rinse them well with water. ? Avoid your genital area. ? Avoid any areas of skin that have broken skin, cuts, or scrapes. ? Scrub your back and under your arms. Make sure to wash skin folds. 6. Let the lather sit on your skin for 1-2 minutes or as long as told by your health care provider. 7. Thoroughly rinse your entire body in the shower. Make sure that all body creases and crevices are rinsed well. 8. Dry off with a clean towel. Do not put any substances on your body afterward--such as powder, lotion, or perfume--unless you are told to do so by your health care provider. Only use lotions that are recommended by the manufacturer. 9. Put on clean clothes or pajamas. 10. If it is the night before your surgery, sleep in clean sheets.  During a sponge bath Follow these steps when using CHG solution during a sponge bath (unless your health care provider gives you different instructions): 1. Use your normal soap and shampoo to wash your face and hair. 2. Pour the CHG onto a clean washcloth. 3. Starting at your neck, lather your body down to your toes. Make sure you follow these instructions: ? If you will be having surgery, pay special attention to the part of your body where you will be having surgery. Scrub this area for at least 1 minute. ? Do not use CHG on your head or face. If the solution gets into your ears or eyes, rinse them well with water. ? Avoid your genital area. ? Avoid any areas of skin that have broken skin, cuts, or scrapes. ? Scrub your back and under your arms. Make sure to  wash skin folds. 4. Let the lather sit on your skin for 1-2 minutes or as long as told by your health care provider. 5. Using a different clean, wet washcloth, thoroughly rinse your entire body. Make sure that all body creases and crevices are rinsed well. 6. Dry off with a clean towel. Do not put any substances on your body afterward--such as powder, lotion, or perfume--unless you are told to do so by your health care provider. Only use lotions that are recommended by the manufacturer. 7. Put on clean clothes or pajamas. 8. If it is the night before your surgery, sleep in clean sheets. How to use CHG prepackaged cloths  Only use CHG cloths as told by your health care provider, and follow the instructions on the label.  Use the CHG cloth on clean, dry skin.  Do not use the CHG cloth on your head or face unless your health care provider tells you to.  When washing with the CHG cloth: ? Avoid your genital area. ? Avoid any areas of skin that have broken skin, cuts, or scrapes. Before surgery Follow these steps when using a CHG cloth to clean before surgery (unless your health care provider gives you different instructions): 1. Using the CHG cloth, vigorously scrub the part of your body where you will be having surgery. Scrub using a back-and-forth motion for 3 minutes. The area on your body should be completely wet with CHG when you are done scrubbing. 2. Do not rinse. Discard the cloth and let the area air-dry. Do not put  any substances on the area afterward, such as powder, lotion, or perfume. 3. Put on clean clothes or pajamas. 4. If it is the night before your surgery, sleep in clean sheets.  For general bathing Follow these steps when using CHG cloths for general bathing (unless your health care provider gives you different instructions). 1. Use a separate CHG cloth for each area of your body. Make sure you wash between any folds of skin and between your fingers and toes. Wash your body  in the following order, switching to a new cloth after each step: ? The front of your neck, shoulders, and chest. ? Both of your arms, under your arms, and your hands. ? Your stomach and groin area, avoiding the genitals. ? Your right leg and foot. ? Your left leg and foot. ? The back of your neck, your back, and your buttocks. 2. Do not rinse. Discard the cloth and let the area air-dry. Do not put any substances on your body afterward--such as powder, lotion, or perfume--unless you are told to do so by your health care provider. Only use lotions that are recommended by the manufacturer. 3. Put on clean clothes or pajamas. Contact a health care provider if:  Your skin gets irritated after scrubbing.  You have questions about using your solution or cloth. Get help right away if:  Your eyes become very red or swollen.  Your eyes itch badly.  Your skin itches badly and is red or swollen.  Your hearing changes.  You have trouble seeing.  You have swelling or tingling in your mouth or throat.  You have trouble breathing.  You swallow any chlorhexidine. Summary  Chlorhexidine gluconate (CHG) is a germ-killing (antiseptic) solution that is used to clean the skin. Cleaning your skin with CHG may help to lower your risk for infection.  You may be given CHG to use for bathing. It may be in a bottle or in a prepackaged cloth to use on your skin. Carefully follow your health care provider's instructions and the instructions on the product label.  Do not use CHG if you have a chlorhexidine allergy.  Contact your health care provider if your skin gets irritated after scrubbing. This information is not intended to replace advice given to you by your health care provider. Make sure you discuss any questions you have with your health care provider. Document Released: 12/10/2011 Document Revised: 06/03/2018 Document Reviewed: 02/12/2017 Elsevier Patient Education  2020 [employer].

## 2019-03-07 ENCOUNTER — Other Ambulatory Visit: Payer: Self-pay

## 2019-03-07 ENCOUNTER — Encounter (HOSPITAL_COMMUNITY)
Admission: RE | Admit: 2019-03-07 | Discharge: 2019-03-07 | Disposition: A | Discharge status: Home / Self Care | Payer: Medicare Other | Source: Ambulatory Visit | Attending: General Surgery | Admitting: General Surgery

## 2019-03-07 ENCOUNTER — Encounter (HOSPITAL_COMMUNITY): Payer: Self-pay

## 2019-03-07 DIAGNOSIS — Z01818 Encounter for other preprocedural examination: Secondary | ICD-10-CM | POA: Insufficient documentation

## 2019-03-07 DIAGNOSIS — C50911 Malignant neoplasm of unspecified site of right female breast: Secondary | ICD-10-CM | POA: Diagnosis not present

## 2019-03-07 LAB — CBC WITH DIFFERENTIAL/PLATELET
Abs Immature Granulocytes: 0.03 10*3/uL (ref 0.00–0.07)
Basophils Absolute: 0 10*3/uL (ref 0.0–0.1)
Basophils Relative: 0 %
Eosinophils Absolute: 0.2 10*3/uL (ref 0.0–0.5)
Eosinophils Relative: 3 %
HCT: 41.1 % (ref 36.0–46.0)
Hemoglobin: 12.9 g/dL (ref 12.0–15.0)
Immature Granulocytes: 1 %
Lymphocytes Relative: 29 %
Lymphs Abs: 1.7 10*3/uL (ref 0.7–4.0)
MCH: 28.7 pg (ref 26.0–34.0)
MCHC: 31.4 g/dL (ref 30.0–36.0)
MCV: 91.5 fL (ref 80.0–100.0)
Monocytes Absolute: 0.6 10*3/uL (ref 0.1–1.0)
Monocytes Relative: 10 %
Neutro Abs: 3.3 10*3/uL (ref 1.7–7.7)
Neutrophils Relative %: 57 %
Platelets: 192 10*3/uL (ref 150–400)
RBC: 4.49 MIL/uL (ref 3.87–5.11)
RDW: 13 % (ref 11.5–15.5)
WBC: 5.8 10*3/uL (ref 4.0–10.5)
nRBC: 0 % (ref 0.0–0.2)

## 2019-03-07 LAB — COMPREHENSIVE METABOLIC PANEL
ALT: 31 U/L (ref 0–44)
AST: 29 U/L (ref 15–41)
Albumin: 4 g/dL (ref 3.5–5.0)
Alkaline Phosphatase: 58 U/L (ref 38–126)
Anion gap: 9 (ref 5–15)
BUN: 19 mg/dL (ref 8–23)
CO2: 28 mmol/L (ref 22–32)
Calcium: 10 mg/dL (ref 8.9–10.3)
Chloride: 101 mmol/L (ref 98–111)
Creatinine, Ser: 0.76 mg/dL (ref 0.44–1.00)
GFR calc Af Amer: >60 mL/min (ref >60)
GFR calc non Af Amer: >60 mL/min (ref >60)
Glucose, Bld: 113 mg/dL — ABNORMAL HIGH (ref 70–99)
Potassium: 4.6 mmol/L (ref 3.5–5.1)
Sodium: 138 mmol/L (ref 135–145)
Total Bilirubin: 0.5 mg/dL (ref 0.3–1.2)
Total Protein: 7.2 g/dL (ref 6.5–8.1)

## 2019-03-08 ENCOUNTER — Other Ambulatory Visit (HOSPITAL_COMMUNITY)
Admission: RE | Admit: 2019-03-08 | Discharge: 2019-03-08 | Disposition: A | Discharge status: Home / Self Care | Payer: Medicare Other | Source: Ambulatory Visit | Attending: General Surgery | Admitting: General Surgery

## 2019-03-08 DIAGNOSIS — Z01812 Encounter for preprocedural laboratory examination: Secondary | ICD-10-CM | POA: Insufficient documentation

## 2019-03-08 DIAGNOSIS — Z20828 Contact with and (suspected) exposure to other viral communicable diseases: Secondary | ICD-10-CM | POA: Insufficient documentation

## 2019-03-08 LAB — SARS CORONAVIRUS 2 (TAT 6-24 HRS): SARS Coronavirus 2: NEGATIVE

## 2019-03-09 ENCOUNTER — Ambulatory Visit (HOSPITAL_COMMUNITY)
Admission: RE | Admit: 2019-03-09 | Discharge: 2019-03-09 | Disposition: A | Discharge status: Home / Self Care | Payer: Medicare Other | Source: Ambulatory Visit | Attending: General Surgery | Admitting: General Surgery

## 2019-03-09 ENCOUNTER — Ambulatory Visit (HOSPITAL_COMMUNITY): Payer: Medicare Other | Admitting: Anesthesiology

## 2019-03-09 ENCOUNTER — Ambulatory Visit (HOSPITAL_COMMUNITY): Payer: Medicare Other

## 2019-03-09 ENCOUNTER — Ambulatory Visit (HOSPITAL_COMMUNITY)
Admission: RE | Admit: 2019-03-09 | Discharge: 2019-03-09 | Disposition: A | Discharge status: Home / Self Care | Payer: Medicare Other | Attending: General Surgery | Admitting: General Surgery

## 2019-03-09 ENCOUNTER — Other Ambulatory Visit: Payer: Self-pay

## 2019-03-09 ENCOUNTER — Encounter (HOSPITAL_COMMUNITY)
Admission: RE | Disposition: A | Discharge status: Home / Self Care | Payer: Self-pay | Source: Home / Self Care | Attending: General Surgery

## 2019-03-09 DIAGNOSIS — Z885 Allergy status to narcotic agent status: Secondary | ICD-10-CM | POA: Insufficient documentation

## 2019-03-09 DIAGNOSIS — Z7982 Long term (current) use of aspirin: Secondary | ICD-10-CM | POA: Insufficient documentation

## 2019-03-09 DIAGNOSIS — Z79890 Hormone replacement therapy (postmenopausal): Secondary | ICD-10-CM | POA: Diagnosis not present

## 2019-03-09 DIAGNOSIS — M1990 Unspecified osteoarthritis, unspecified site: Secondary | ICD-10-CM | POA: Insufficient documentation

## 2019-03-09 DIAGNOSIS — Z884 Allergy status to anesthetic agent status: Secondary | ICD-10-CM | POA: Diagnosis not present

## 2019-03-09 DIAGNOSIS — I10 Essential (primary) hypertension: Secondary | ICD-10-CM | POA: Insufficient documentation

## 2019-03-09 DIAGNOSIS — Z79899 Other long term (current) drug therapy: Secondary | ICD-10-CM | POA: Diagnosis not present

## 2019-03-09 DIAGNOSIS — D0511 Intraductal carcinoma in situ of right breast: Secondary | ICD-10-CM | POA: Diagnosis not present

## 2019-03-09 DIAGNOSIS — Z170 Estrogen receptor positive status [ER+]: Secondary | ICD-10-CM | POA: Insufficient documentation

## 2019-03-09 DIAGNOSIS — Z01818 Encounter for other preprocedural examination: Secondary | ICD-10-CM

## 2019-03-09 DIAGNOSIS — Z803 Family history of malignant neoplasm of breast: Secondary | ICD-10-CM | POA: Insufficient documentation

## 2019-03-09 DIAGNOSIS — Z6839 Body mass index (BMI) 39.0-39.9, adult: Secondary | ICD-10-CM | POA: Diagnosis not present

## 2019-03-09 DIAGNOSIS — D241 Benign neoplasm of right breast: Secondary | ICD-10-CM | POA: Diagnosis not present

## 2019-03-09 DIAGNOSIS — Z87891 Personal history of nicotine dependence: Secondary | ICD-10-CM | POA: Diagnosis not present

## 2019-03-09 HISTORY — PX: PARTIAL MASTECTOMY WITH NEEDLE LOCALIZATION: SHX6008

## 2019-03-09 SURGERY — PARTIAL MASTECTOMY WITH NEEDLE LOCALIZATION
Anesthesia: General | Site: Breast | Laterality: Right

## 2019-03-09 MED ORDER — KETOROLAC TROMETHAMINE 30 MG/ML IJ SOLN: 15.0000 mg | Freq: Once | INTRAMUSCULAR | Status: DC

## 2019-03-09 MED ORDER — CHLORHEXIDINE GLUCONATE CLOTH 2 % EX PADS: 6.0000 | MEDICATED_PAD | Freq: Once | CUTANEOUS | Status: DC

## 2019-03-09 MED ORDER — LACTATED RINGERS IV SOLN
INTRAVENOUS | Status: DC | PRN
  Administered 2019-03-09: 11:00:00 via INTRAVENOUS

## 2019-03-09 MED ORDER — LIDOCAINE HCL (PF) 2 % IJ SOLN
INTRAMUSCULAR | Status: AC
  Filled 2019-03-09: qty 10

## 2019-03-09 MED ORDER — ONDANSETRON HCL 4 MG/2ML IJ SOLN
INTRAMUSCULAR | Status: AC
  Filled 2019-03-09: qty 2

## 2019-03-09 MED ORDER — DEXAMETHASONE SODIUM PHOSPHATE 4 MG/ML IJ SOLN
INTRAMUSCULAR | Status: DC | PRN
  Administered 2019-03-09: 10 mg via INTRAVENOUS

## 2019-03-09 MED ORDER — FENTANYL CITRATE (PF) 100 MCG/2ML IJ SOLN
INTRAMUSCULAR | Status: AC
  Filled 2019-03-09: qty 2

## 2019-03-09 MED ORDER — FENTANYL CITRATE (PF) 100 MCG/2ML IJ SOLN
INTRAMUSCULAR | Status: DC | PRN
  Administered 2019-03-09: 50 ug via INTRAVENOUS
  Administered 2019-03-09: 100 ug via INTRAVENOUS
  Administered 2019-03-09: 50 ug via INTRAVENOUS

## 2019-03-09 MED ORDER — CEFAZOLIN SODIUM-DEXTROSE 2-4 GM/100ML-% IV SOLN
INTRAVENOUS | Status: AC
  Filled 2019-03-09: qty 100

## 2019-03-09 MED ORDER — MIDAZOLAM HCL 5 MG/5ML IJ SOLN
INTRAMUSCULAR | Status: DC | PRN
  Administered 2019-03-09: 2 mg via INTRAVENOUS

## 2019-03-09 MED ORDER — CEFAZOLIN SODIUM-DEXTROSE 2-4 GM/100ML-% IV SOLN
2.0000 g | INTRAVENOUS | Status: AC
  Administered 2019-03-09: 11:00:00 2 g via INTRAVENOUS

## 2019-03-09 MED ORDER — ONDANSETRON HCL 4 MG/2ML IJ SOLN: 4.0000 mg | Freq: Once | INTRAMUSCULAR | Status: DC | PRN

## 2019-03-09 MED ORDER — BUPIVACAINE HCL (PF) 0.5 % IJ SOLN
INTRAMUSCULAR | Status: DC | PRN
  Administered 2019-03-09: 10 mL

## 2019-03-09 MED ORDER — BUPIVACAINE HCL (PF) 0.5 % IJ SOLN
INTRAMUSCULAR | Status: AC
  Filled 2019-03-09: qty 30

## 2019-03-09 MED ORDER — PROPOFOL 10 MG/ML IV BOLUS
INTRAVENOUS | Status: AC
  Filled 2019-03-09: qty 20

## 2019-03-09 MED ORDER — PROPOFOL 10 MG/ML IV BOLUS
INTRAVENOUS | Status: DC | PRN
  Administered 2019-03-09: 20 mg via INTRAVENOUS
  Administered 2019-03-09: 200 mg via INTRAVENOUS

## 2019-03-09 MED ORDER — LIDOCAINE 2% (20 MG/ML) 5 ML SYRINGE
INTRAMUSCULAR | Status: AC
  Filled 2019-03-09: qty 5

## 2019-03-09 MED ORDER — LACTATED RINGERS IV SOLN
Freq: Once | INTRAVENOUS | Status: AC
  Administered 2019-03-09: 10:00:00 via INTRAVENOUS

## 2019-03-09 MED ORDER — ONDANSETRON HCL 4 MG/2ML IJ SOLN
INTRAMUSCULAR | Status: DC | PRN
  Administered 2019-03-09: 4 mg via INTRAVENOUS

## 2019-03-09 MED ORDER — DEXAMETHASONE SODIUM PHOSPHATE 10 MG/ML IJ SOLN
INTRAMUSCULAR | Status: AC
  Filled 2019-03-09: qty 1

## 2019-03-09 MED ORDER — MIDAZOLAM HCL 2 MG/2ML IJ SOLN
INTRAMUSCULAR | Status: AC
  Filled 2019-03-09: qty 2

## 2019-03-09 MED ORDER — LIDOCAINE HCL (CARDIAC) PF 100 MG/5ML IV SOSY
PREFILLED_SYRINGE | INTRAVENOUS | Status: DC | PRN
  Administered 2019-03-09: 100 mg via INTRATRACHEAL

## 2019-03-09 MED ORDER — HYDROMORPHONE HCL 1 MG/ML IJ SOLN: 0.2500 mg | INTRAMUSCULAR | Status: DC | PRN

## 2019-03-09 MED ORDER — TRAMADOL HCL 50 MG PO TABS: 50.0000 mg | ORAL_TABLET | Freq: Four times a day (QID) | ORAL | 0 refills | Status: DC | PRN

## 2019-03-09 MED ORDER — 0.9 % SODIUM CHLORIDE (POUR BTL) OPTIME
TOPICAL | Status: DC | PRN
  Administered 2019-03-09: 1000 mL

## 2019-03-09 MED ORDER — PHENYLEPHRINE 40 MCG/ML (10ML) SYRINGE FOR IV PUSH (FOR BLOOD PRESSURE SUPPORT)
PREFILLED_SYRINGE | INTRAVENOUS | Status: AC
  Filled 2019-03-09: qty 10

## 2019-03-09 MED ORDER — PHENYLEPHRINE HCL (PRESSORS) 10 MG/ML IV SOLN
INTRAVENOUS | Status: DC | PRN
  Administered 2019-03-09 (×2): 100 ug via INTRAVENOUS

## 2019-03-09 SURGICAL SUPPLY — 29 items
ADH SKN CLS APL DERMABOND .7 (GAUZE/BANDAGES/DRESSINGS) ×1
CLOTH BEACON ORANGE TIMEOUT ST (SAFETY) ×2 IMPLANT
COVER LIGHT HANDLE STERIS (MISCELLANEOUS) ×4 IMPLANT
DECANTER SPIKE VIAL GLASS SM (MISCELLANEOUS) ×2 IMPLANT
DERMABOND ADVANCED (GAUZE/BANDAGES/DRESSINGS) ×1
DERMABOND ADVANCED .7 DNX12 (GAUZE/BANDAGES/DRESSINGS) IMPLANT
DURAPREP 26ML APPLICATOR (WOUND CARE) ×2 IMPLANT
ELECT REM PT RETURN 9FT ADLT (ELECTROSURGICAL) ×2
ELECTRODE REM PT RTRN 9FT ADLT (ELECTROSURGICAL) ×1 IMPLANT
GLOVE BIO SURGEON STRL SZ7 (GLOVE) ×1 IMPLANT
GLOVE BIOGEL PI IND STRL 7.0 (GLOVE) ×1 IMPLANT
GLOVE BIOGEL PI INDICATOR 7.0 (GLOVE) ×1
GLOVE SURG SS PI 7.5 STRL IVOR (GLOVE) ×4 IMPLANT
GOWN STRL REUS W/TWL LRG LVL3 (GOWN DISPOSABLE) ×6 IMPLANT
KIT TURNOVER KIT A (KITS) ×2 IMPLANT
MANIFOLD NEPTUNE II (INSTRUMENTS) ×2 IMPLANT
NDL HYPO 25X1 1.5 SAFETY (NEEDLE) ×1 IMPLANT
NEEDLE HYPO 25X1 1.5 SAFETY (NEEDLE) ×2 IMPLANT
NS IRRIG 1000ML POUR BTL (IV SOLUTION) ×2 IMPLANT
PACK MINOR (CUSTOM PROCEDURE TRAY) ×2 IMPLANT
PAD ARMBOARD 7.5X6 YLW CONV (MISCELLANEOUS) ×2 IMPLANT
SET BASIN LINEN APH (SET/KITS/TRAYS/PACK) ×2 IMPLANT
SPONGE LAP 18X18 RF (DISPOSABLE) ×2 IMPLANT
SUT MNCRL AB 4-0 PS2 18 (SUTURE) ×2 IMPLANT
SUT SILK 3 0 (SUTURE) ×2
SUT SILK 3-0 FS1 18XBRD (SUTURE) ×1 IMPLANT
SUT VIC AB 3-0 SH 27 (SUTURE) ×2
SUT VIC AB 3-0 SH 27X BRD (SUTURE) ×1 IMPLANT
SYR CONTROL 10ML LL (SYRINGE) ×2 IMPLANT

## 2019-03-09 NOTE — Discharge Instructions (Signed)
Lumpectomy, Care After This sheet gives you information about how to care for yourself after your procedure. Your health care provider may also give you more specific instructions. If you have problems or questions, contact your health care provider. What can I expect after the procedure? After the procedure, it is common to have:  Breast swelling.  Breast tenderness.  Stiffness in your arm or shoulder.  A change in the shape and feel of your breast.  Scar tissue that feels hard to the touch in the area where the lump was removed. Follow these instructions at home: Medicines  Take over-the-counter and prescription medicines only as told by your health care provider.  Take your antibiotic medicine as told by your health care provider. Do not stop taking the antibiotic even if you start to feel better.  If you are taking prescription pain medicine, take actions to prevent or treat constipation. Your health care provider may recommend that you: ? Drink enough fluid to keep your urine pale yellow. ? Eat foods that are high in fiber, such as fresh fruits and vegetables, whole grains, and beans. ? Limit foods that are high in fat and processed sugars, such as fried and sweet foods. ? Take an over-the-counter or prescription medicine for constipation. Bathing  Do not take baths, swim, or use a hot tub until your health care provider approves. Ask your health care provider if you may take showers. You may only be allowed to take sponge baths. Incision care      Follow instructions from your health care provider about how to take care of your incision. Make sure you: ? Wash your hands with soap and water before you change your bandage (dressing). If soap and water are not available, use hand sanitizer. ? Change your dressing as told by your health care provider. ? Leave stitches (sutures), skin glue, or adhesive strips in place. These skin closures may need to stay in place for 2 weeks or  longer. If adhesive strip edges start to loosen and curl up, you may trim the loose edges. Do not remove adhesive strips completely unless your health care provider tells you to do that.  Check your incision area every day for signs of infection. Check for: ? Redness, swelling, or pain. ? Fluid or blood. ? Warmth. ? Pus or a bad smell.  Keep your dressing clean and dry.  If you were sent home with a surgical drain in place, follow instructions from your health care provider about emptying it. Activity  Return to your normal activities as told by your health care provider. Ask your health care provider what activities are safe for you.  Be careful to avoid any activities that could cause an injury to your arm on the side of your surgery.  Do not lift anything that is heavier than 10 lb (4.5 kg), or the limit that you are told, until your health care provider says that it is safe. Avoid lifting with the arm that is on the side of your surgery.  Do not carry heavy objects on your shoulder on the side of your surgery.  After your drain is removed, you should perform exercises to keep your arm from getting stiff and swollen. Talk with your health care provider about which exercises are safe for you. General instructions  Do not drive or use heavy machinery while taking prescription pain medicine.  Wear a supportive bra as told by your health care provider.  Raise (elevate) your arm above  the level of your heart while you are sitting or lying down.  Do not wear tight jewelry on your arm, wrist, or fingers on the side of your surgery.  Keep all follow-up visits as told by your health care provider. This is important. ? You may need to be screened for extra fluid around the lymph nodes (lymphedema). Follow instructions from your health care provider about how often you should be checked.  If you had any lymph nodes removed during your procedure, be sure to tell all of your health care  providers. This is important information to share before you are involved in certain procedures, such as having blood tests or having your blood pressure taken. Contact a health care provider if:  You develop a rash.  You have a fever.  Your pain medicine is not working.  Your swelling, weakness, or numbness in your arm has not improved after a few weeks.  You have new swelling in your breast or arm.  You have redness, swelling, or pain in your incision area.  You have fluid or blood coming from your incision.  Your incision feels warm to the touch.  You have pus or a bad smell coming from your incision. Get help right away if:  You have very bad pain in your breast or arm.  You have chest pain.  You have difficulty breathing. Summary  After the procedure, it is common to have breast tenderness, swelling, and stiffness in your arm and shoulder.  Follow instructions from your health care provider about how to take care of your incision.  Do not lift anything that is heavier than 10 lb (4.5 kg), or the limit that you are told, until your health care provider says that it is safe. Avoid lifting with the arm that is on the side of your surgery.  If you had any lymph nodes removed during your procedure, be sure to tell all of your health care providers. This is important information to share before you are involved in certain procedures, such as having blood tests or having your blood pressure taken. This information is not intended to replace advice given to you by your health care provider. Make sure you discuss any questions you have with your health care provider. Document Released: 04/02/2006 Document Revised: 06/25/2018 Document Reviewed: 11/28/2015 Elsevier Patient Education  2020 [employer].

## 2019-03-09 NOTE — Anesthesia Procedure Notes (Signed)
Procedure Name: LMA Insertion Date/Time: 03/09/2019 11:13 AM Performed by: Jonna Munro, CRNA Pre-anesthesia Checklist: Patient identified, Emergency Drugs available, Suction available, Patient being monitored and Timeout performed Patient Re-evaluated:Patient Re-evaluated prior to induction Oxygen Delivery Method: Circle system utilized Preoxygenation: Pre-oxygenation with 100% oxygen Induction Type: IV induction LMA: LMA inserted LMA Size: 4.0 Number of attempts: 1 Tube secured with: Tape Dental Injury: Teeth and Oropharynx as per pre-operative assessment

## 2019-03-09 NOTE — Anesthesia Preprocedure Evaluation (Addendum)
Anesthesia Evaluation  Patient identified by MRN, date of birth, ID band Patient awake    Reviewed: Allergy & Precautions, NPO status , Patient's Chart, lab work & pertinent test results  History of Anesthesia Complications (+) history of anesthetic complications  Airway Mallampati: II  TM Distance: >3 FB Neck ROM: Full    Dental  (+) Missing   Pulmonary former smoker,    Pulmonary exam normal breath sounds clear to auscultation       Cardiovascular hypertension, Pt. on medications Normal cardiovascular exam Rhythm:Regular Rate:Normal  EKG - NSR   Neuro/Psych    GI/Hepatic negative GI ROS, Neg liver ROS,   Endo/Other  Morbid obesity (BMI -39.8)  Renal/GU negative Renal ROS     Musculoskeletal  (+) Arthritis , Osteoarthritis,    Abdominal   Peds  Hematology negative hematology ROS (+)   Anesthesia Other Findings   Reproductive/Obstetrics negative OB ROS                            Anesthesia Physical Anesthesia Plan  ASA: II  Anesthesia Plan: General   Post-op Pain Management:    Induction: Intravenous  PONV Risk Score and Plan: 3 and Midazolam, Ondansetron and Dexamethasone  Airway Management Planned: LMA  Additional Equipment:   Intra-op Plan:   Post-operative Plan: Extubation in OR  Informed Consent: I have reviewed the patients History and Physical, chart, labs and discussed the procedure including the risks, benefits and alternatives for the proposed anesthesia with the patient or authorized representative who has indicated his/her understanding and acceptance.     Dental advisory given  Plan Discussed with: CRNA  Anesthesia Plan Comments:         Anesthesia Quick Evaluation

## 2019-03-09 NOTE — Anesthesia Postprocedure Evaluation (Signed)
Anesthesia Post Note  Patient: Ange Lablanc Morad  Procedure(s) Performed: PARTIAL MASTECTOMY WITH NEEDLE LOCALIZATION (Right Breast)  Patient location during evaluation: PACU Anesthesia Type: General Level of consciousness: awake, oriented, awake and alert and patient cooperative Pain management: pain level controlled Vital Signs Assessment: post-procedure vital signs reviewed and stable Respiratory status: spontaneous breathing, respiratory function stable, nonlabored ventilation and patient connected to face mask oxygen Cardiovascular status: stable Postop Assessment: no apparent nausea or vomiting Anesthetic complications: no     Last Vitals:  Vitals:   03/09/19 0823  BP: (!) 131/96  Pulse: 82  Resp: 18  Temp: 36.8 C  SpO2: 100%    Last Pain:  Vitals:   03/09/19 0823  TempSrc: Oral  PainSc: 0-No pain                 Ronn Smolinsky

## 2019-03-09 NOTE — Op Note (Signed)
Patient:  Chelsey Johnson  DOB:  [birth year]  MRN:  [id number]   Preop Diagnosis: Ductal carcinoma in situ of right breast  Postop Diagnosis: Same  Procedure: Right partial mastectomy after needle localization  Surgeon: Aviva Signs, MD  Anes: General  Indications: Patient is a 68 year old black female who was recently diagnosed with a 4 mm ductal carcinoma in situ of the right breast.  After extensive discussion with the patient, she has elected to proceed with a partial mastectomy with radiation therapy.  The risks and benefits of the procedure including bleeding, infection, and the possibility of needing repeat surgery due to unclear margins were fully explained to the patient, who gave informed consent.  Procedure note: The patient was placed in the supine position after undergoing needle localization in the radiology department.  The right breast was prepped and draped using usual sterile technique with ChloraPrep.  Surgical site confirmation was performed.  The needle was localized in the upper outer quadrant of the right breast.  An elliptical incision was made around the guidewire as well as a previous surgical scar.  The dissection was taken down to the area of concern.  A short suture was placed superiorly and a long suture placed laterally for orientation purposes.  The specimen was sent to the radiology department.  Specimen radiography revealed the clip to be in the specimen removed.  It was then sent to pathology for further examination.  A bleeding was controlled using Bovie electrocautery.  The subcutaneous layer was reapproximated using 3-0 Vicryl interrupted suture.  The skin was closed using a 4-0 Monocryl subcuticular suture.  0.5% Sensorcaine was instilled into the surrounding wound.  The skin was closed covered with Dermabond.  All tape and needle counts were correct at the end of the procedure.  The patient was awakened and transferred to PACU in stable  condition.  Complications: None  EBL: Minimal  Specimen: Right breast tissue

## 2019-03-09 NOTE — Transfer of Care (Signed)
Immediate Anesthesia Transfer of Care Note  Patient: Chelsey Johnson  Procedure(s) Performed: PARTIAL MASTECTOMY WITH NEEDLE LOCALIZATION (Right Breast)  Patient Location: PACU  Anesthesia Type:General  Level of Consciousness: awake, alert , oriented and patient cooperative  Airway & Oxygen Therapy: Patient Spontanous Breathing and Patient connected to face mask oxygen  Post-op Assessment: Report given to RN, Post -op Vital signs reviewed and stable and Patient moving all extremities X 4  Post vital signs: Reviewed and stable  Last Vitals:  Vitals Value Taken Time  BP 121/81 03/09/19 1206  Temp    Pulse 94 03/09/19 1208  Resp 21 03/09/19 1208  SpO2 97 % 03/09/19 1208  Vitals shown include unvalidated device data.  Last Pain:  Vitals:   03/09/19 0823  TempSrc: Oral  PainSc: 0-No pain      Patients Stated Pain Goal: 6 ([id number])  Complications: No apparent anesthesia complications

## 2019-03-09 NOTE — Interval H&P Note (Signed)
History and Physical Interval Note:  [id number] AM  Chelsey Johnson  has presented today for surgery, with the diagnosis of DCIS.  The various methods of treatment have been discussed with the patient and family. After consideration of risks, benefits and other options for treatment, the patient has consented to  Procedure(s): PARTIAL MASTECTOMY WITH NEEDLE LOCALIZATION (Right) as a surgical intervention.  The patient's history has been reviewed, patient examined, no change in status, stable for surgery.  I have reviewed the patient's chart and labs.  Questions were answered to the patient's satisfaction.     Aviva Signs

## 2019-03-15 LAB — SURGICAL PATHOLOGY

## 2019-03-17 ENCOUNTER — Ambulatory Visit (INDEPENDENT_AMBULATORY_CARE_PROVIDER_SITE_OTHER): Payer: Self-pay | Admitting: General Surgery

## 2019-03-17 ENCOUNTER — Other Ambulatory Visit: Payer: Self-pay

## 2019-03-17 ENCOUNTER — Encounter: Payer: Self-pay | Admitting: General Surgery

## 2019-03-17 VITALS — BP 121/84 | HR 87 | Temp 98.4°F | Resp 16 | Ht 62.0 in | Wt 225.0 lb

## 2019-03-17 DIAGNOSIS — Z09 Encounter for follow-up examination after completed treatment for conditions other than malignant neoplasm: Secondary | ICD-10-CM

## 2019-03-17 NOTE — Progress Notes (Signed)
Subjective:     Chelsey Johnson  Patient here for postoperative visit.  Doing well.  Has no complaints. Objective:    BP 121/84 (BP Location: Left Arm, Patient Position: Sitting, Cuff Size: Normal)   Pulse 87   Temp 98.4 F (36.9 C) (Oral)   Resp 16   Ht 5\' 2"  (1.575 m)   Wt 225 lb (102.1 kg)   SpO2 98%   BMI 41.15 kg/m   General:  alert, cooperative and no distress  Right breast incision healing well.  No hematoma noted. Final pathology reveals DCIS of the right breast.  Inked margins are clear.  Patient aware of pathology.     Assessment:    Doing well postoperatively.    Plan:   Will refer to oncology for further management and treatment.  Patient pleased with results.

## 2019-04-04 ENCOUNTER — Encounter (HOSPITAL_COMMUNITY): Payer: Self-pay | Admitting: *Deleted

## 2019-04-04 ENCOUNTER — Other Ambulatory Visit: Payer: Self-pay

## 2019-04-05 ENCOUNTER — Inpatient Hospital Stay (HOSPITAL_COMMUNITY): Payer: Medicare PPO

## 2019-04-05 ENCOUNTER — Encounter (HOSPITAL_COMMUNITY): Payer: Self-pay | Admitting: Hematology

## 2019-04-05 ENCOUNTER — Encounter (HOSPITAL_COMMUNITY): Payer: Self-pay | Admitting: *Deleted

## 2019-04-05 ENCOUNTER — Inpatient Hospital Stay (HOSPITAL_COMMUNITY): Payer: Medicare PPO | Attending: Hematology | Admitting: Hematology

## 2019-04-05 ENCOUNTER — Encounter (HOSPITAL_COMMUNITY): Payer: Self-pay | Admitting: Lab

## 2019-04-05 VITALS — BP 134/58 | HR 64 | Temp 97.7°F | Resp 20 | Ht 60.5 in | Wt 229.0 lb

## 2019-04-05 DIAGNOSIS — Z800 Family history of malignant neoplasm of digestive organs: Secondary | ICD-10-CM | POA: Insufficient documentation

## 2019-04-05 DIAGNOSIS — D0511 Intraductal carcinoma in situ of right breast: Secondary | ICD-10-CM

## 2019-04-05 DIAGNOSIS — Z87891 Personal history of nicotine dependence: Secondary | ICD-10-CM | POA: Diagnosis not present

## 2019-04-05 DIAGNOSIS — Z807 Family history of other malignant neoplasms of lymphoid, hematopoietic and related tissues: Secondary | ICD-10-CM | POA: Insufficient documentation

## 2019-04-05 DIAGNOSIS — Z170 Estrogen receptor positive status [ER+]: Secondary | ICD-10-CM | POA: Diagnosis not present

## 2019-04-05 DIAGNOSIS — Z8041 Family history of malignant neoplasm of ovary: Secondary | ICD-10-CM | POA: Diagnosis not present

## 2019-04-05 DIAGNOSIS — Z9011 Acquired absence of right breast and nipple: Secondary | ICD-10-CM | POA: Diagnosis not present

## 2019-04-05 DIAGNOSIS — Z803 Family history of malignant neoplasm of breast: Secondary | ICD-10-CM | POA: Insufficient documentation

## 2019-04-05 DIAGNOSIS — Z780 Asymptomatic menopausal state: Secondary | ICD-10-CM

## 2019-04-05 DIAGNOSIS — Z79811 Long term (current) use of aromatase inhibitors: Secondary | ICD-10-CM | POA: Diagnosis not present

## 2019-04-05 LAB — VITAMIN D 25 HYDROXY (VIT D DEFICIENCY, FRACTURES): Vit D, 25-Hydroxy: 41.39 ng/mL (ref 30–100)

## 2019-04-05 NOTE — Progress Notes (Signed)
[facility] NOTE  Patient Care Team: Sharilyn Sites, MD as PCP - General (Family Medicine) Danie Binder, MD (Gastroenterology)  CHIEF COMPLAINTS/PURPOSE OF CONSULTATION:  Newly diagnosed right breast DCIS.  HISTORY OF PRESENTING ILLNESS:  Chelsey Johnson 69 y.o. female is seen in consultation today at the request of Dr. Arnoldo Morale for further management of right breast DCIS.  This patient underwent screening mammogram on 01/31/2019 which was BI-RADS Category 0.  Additional views right breast showed indeterminate 0.4 cm mass within the upper outer right breast which was new.  She underwent right breast biopsy at 10 o'clock position on 02/15/2019 consistent with intermediate grade ductal carcinoma in situ.  ER and PR receptors were strongly positive.  She was evaluated by Dr. Arnoldo Morale and underwent right partial mastectomy on 03/09/2019 showing 0.6 cm DCIS involving intraductal papilloma, intermediate grade, in situ carcinoma less than 1 mm from the inferior margin, not on ink.  Other margins negative.  Right inferior margin excision shows low-grade DCIS, 0.6 cm.  She has recovered very well from surgery.  She lives at home by herself and is independent of ADLs and IADLs.  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  She does not have any significant history of smoking.  I reviewed her records extensively and collaborated the history with the patient.  SUMMARY OF ONCOLOGIC HISTORY: Oncology History  Ductal carcinoma in situ (DCIS) of right breast  03/09/2019 Initial Diagnosis   Ductal carcinoma in situ (DCIS) of right breast   04/05/2019 Cancer Staging   Staging form: Breast, AJCC 8th Edition - Clinical stage from 04/05/2019: Stage 0 (cTis (DCIS), cN0, cM0, ER+, PR+, HER2: Not Assessed) - Signed by Derek Jack, MD on 04/05/2019     In terms of breast cancer risk profile:  She menarched at early age of 55 and went to menopause at age 49 when she  had hysterectomy for bleeding. She was never pregnant. She  received birth control pills for approximately 10 years.  She was never exposed to fertility medications.  She took hormone replacement therapy with estrogen for 20 years.  She stopped them prior to surgery. Family history significant for 2 sisters with breast cancer, brother with prostate cancer.  Niece had ovarian cancer.  Father had lymphoma and paternal aunt had stomach cancer.  MEDICAL HISTORY:  Past Medical History:  Diagnosis Date  . Allergy   . Anesthesia complication    tachycardia  . Breast cancer ([place])   . Complication of anesthesia    blood pressure drops quickly with anesthetic  . HTN (hypertension)   . Obesity     SURGICAL HISTORY: Past Surgical History:  Procedure Laterality Date  . ABDOMINAL HYSTERECTOMY  1986   complete, uterine fibroids  . BREAST BIOPSY Right    negative  . COLONOSCOPY  09/19/2011   Procedure: COLONOSCOPY;  Surgeon: Danie Binder, MD;  Location: [facility];  Service: Endoscopy;  Laterality: N/A;  12:30  . PARTIAL MASTECTOMY WITH NEEDLE LOCALIZATION Right 03/09/2019   Procedure: PARTIAL MASTECTOMY WITH NEEDLE LOCALIZATION;  Surgeon: Aviva Signs, MD;  Location: [facility];  Service: General;  Laterality: Right;    SOCIAL HISTORY: Social History   Socioeconomic History  . Marital status: Single    Spouse name: Not on file  . Number of children: 0  . Years of education: Not on file  . Highest education level: Not on file  Occupational History  . Occupation: Health visitor  Employer: [employer]  Tobacco Use  . Smoking status: Former Smoker    Packs/day: 1.00    Years: 10.00    Pack years: 10.00    Types: Cigarettes    Quit date: 09/11/1991    Years since quitting: 27.5  . Smokeless tobacco: Never Used  . Tobacco comment: quit about 20 + years  Substance and Sexual Activity  . Alcohol use: No  . Drug use: No  . Sexual activity: Not  Currently  Other Topics Concern  . Not on file  Social History Narrative   Lives w/ mother   Social Determinants of Health   Financial Resource Strain: Low Risk   . Difficulty of Paying Living Expenses: Not hard at all  Food Insecurity: No Food Insecurity  . Worried About Charity fundraiser in the Last Year: Never true  . Ran Out of Food in the Last Year: Never true  Transportation Needs: No Transportation Needs  . Lack of Transportation (Medical): No  . Lack of Transportation (Non-Medical): No  Physical Activity: Insufficiently Active  . Days of Exercise per Week: 7 days  . Minutes of Exercise per Session: 20 min  Stress: No Stress Concern Present  . Feeling of Stress : Not at all  Social Connections: Somewhat Isolated  . Frequency of Communication with Friends and Family: More than three times a week  . Frequency of Social Gatherings with Friends and Family: More than three times a week  . Attends Religious Services: Never  . Active Member of Clubs or Organizations: No  . Attends Archivist Meetings: More than 4 times per year  . Marital Status: Never married  Intimate Partner Violence: Not At Risk  . Fear of Current or Ex-Partner: No  . Emotionally Abused: No  . Physically Abused: No  . Sexually Abused: No    FAMILY HISTORY: Family History  Problem Relation Age of Onset  . Ulcers Brother   . Colon polyps Mother 10  . Congestive Heart Failure Mother   . Lymphoma Father   . Hypertension Sister   . Breast cancer Sister   . Hypertension Sister   . Breast cancer Sister   . Hypertension Sister   . Hypertension Sister     ALLERGIES:  is allergic to anesthetics, amide and tylox [oxycodone-acetaminophen].  MEDICATIONS:  Current Outpatient Medications  Medication Sig Dispense Refill  . aspirin EC 81 MG tablet Take 81 mg by mouth daily.    . Cholecalciferol (VITAMIN D) 50 MCG (2000 UT) CAPS Take 2,000 Units by mouth daily.    . magnesium oxide (MAG-OX) 400  MG tablet Take 200 mg by mouth daily.    . OMEGA 3 1200 MG CAPS Take 1,200 mg by mouth daily.     . quinapril-hydrochlorothiazide (ACCURETIC) 20-12.5 MG tablet Take 1 tablet by mouth daily.    . vitamin B-12 (CYANOCOBALAMIN) 500 MCG tablet Take 500 mcg by mouth daily.    . celecoxib (CELEBREX) 200 MG capsule Take 200 mg by mouth as needed.    . traMADol (ULTRAM) 50 MG tablet Take 1 tablet (50 mg total) by mouth every 6 (six) hours as needed. (Patient not taking: Reported on 04/05/2019) 20 tablet 0   No current facility-administered medications for this visit.    REVIEW OF SYSTEMS:   Constitutional: Denies fevers, chills or abnormal night sweats Eyes: Denies blurriness of vision, double vision or watery eyes Ears, nose, mouth, throat, and face: Denies mucositis or sore throat Respiratory: Denies  cough, dyspnea or wheezes Cardiovascular: Denies palpitation, chest discomfort positive for ankle swelling. Gastrointestinal:  Denies nausea, heartburn or change in bowel habits Skin: Denies abnormal skin rashes Lymphatics: Denies new lymphadenopathy or easy bruising Neurological:Denies numbness, tingling or new weaknesses Behavioral/Psych: Mood is stable, no new changes  Breast:  Denies any palpable lumps or discharge All other systems were reviewed with the patient and are negative.  PHYSICAL EXAMINATION: ECOG PERFORMANCE STATUS: 1 - Symptomatic but completely ambulatory  Vitals:   04/05/19 1313  BP: (!) 134/58  Pulse: 64  Resp: 20  Temp: 97.7 F (36.5 C)  SpO2: 99%   Filed Weights   04/05/19 1313  Weight: 229 lb (103.9 kg)    GENERAL:alert, no distress and comfortable SKIN: skin color, texture, turgor are normal, no rashes or significant lesions EYES: normal, conjunctiva are pink and non-injected, sclera clear OROPHARYNX:no exudate, no erythema and lips, buccal mucosa, and tongue normal  NECK: supple, thyroid normal size, non-tender, without nodularity LYMPH:  no palpable  lymphadenopathy in the cervical, axillary or inguinal LUNGS: clear to auscultation and percussion with normal breathing effort HEART: regular rate & rhythm and no murmurs.  1+ edema bilaterally. ABDOMEN:abdomen soft, non-tender and normal bowel sounds Musculoskeletal:no cyanosis of digits and no clubbing  PSYCH: alert & oriented x 3 with fluent speech NEURO: no focal motor/sensory deficits BREAST: Right breast upper outer quadrant lumpectomy is well-healed.  A small seroma present.  (exam performed in the presence of a chaperone)   LABORATORY DATA:  I have reviewed the data as listed Lab Results  Component Value Date   WBC 5.8 03/07/2019   HGB 12.9 03/07/2019   HCT 41.1 03/07/2019   MCV 91.5 03/07/2019   PLT 192 03/07/2019   Lab Results  Component Value Date   NA 138 03/07/2019   K 4.6 03/07/2019   CL 101 03/07/2019   CO2 28 03/07/2019    RADIOGRAPHIC STUDIES: I have personally reviewed the radiological reports and agreed with the findings in the report.  ASSESSMENT AND PLAN:  Ductal carcinoma in situ (DCIS) of right breast 1.  Intermediate grade right breast DCIS: -Screening mammogram on 01/31/2019 was BI-RADS Category 0.  Additional views of the right breast shows indeterminate 0.4 cm mass within the upper outer right breast which is new. -Right breast biopsy at 10 o'clock position on 02/15/2019 consistent with DCIS, intermediate grade, ER/PR positive. -Right partial mastectomy on 03/09/2019 showing 0.6 cm involving an intraductal papilloma, intermediate grade, in situ carcinoma less than 1 mm from inferior margin, not on ink.  Other margins negative.  Right inferior margin excision shows low-grade DCIS, 0.6 cm.  Not completely clear if it is 2 foci of DCIS. -I had a prolonged discussion about the pathology report.  I have recommended antiestrogen therapy for 5 years given her ER positive DCIS. -I have also discussed the fact that antiestrogen therapy provides risk reduction in  the ipsilateral breast and in the contralateral breast in ER positive tumors.  No survival advantage has been demonstrated. -I have recommended a bone density test.  We will also check vitamin D levels. -Based on her bone density results, will decide if she is a candidate for tamoxifen or aromatase inhibitor. -She lives close to [place].  We will make a referral to radiation oncology in [place].  2.  Family history: -2 sisters had breast cancer.  Father had lymphoma.  Brother had prostate cancer. -Niece had ovarian cancer.  Paternal aunt had stomach cancer. -Given the extensive family  history, I have strongly recommended genetic testing.   All questions were answered. The patient knows to call the clinic with any problems, questions or concerns.    Derek Jack, MD 04/05/19

## 2019-04-05 NOTE — Patient Instructions (Addendum)
**Note Chelsey-Identified via Obfuscation** Chelsey Johnson at [facility] Discharge Instructions  You were seen today by Dr. Delton Coombes. He went over your history, family history and how you've been lately. He will schedule you for a bone density and genetic testing. He will get blood work done today. He will see you back after your bone density scan for follow up.    Thank you for choosing [place] at [facility] to provide your oncology and hematology care.  To afford each patient quality time with our provider, please arrive at least 15 minutes before your scheduled appointment time.   If you have a lab appointment with the [place] please come in thru the  Main Entrance and check in at the main information desk  You need to re-schedule your appointment should you arrive 10 or more minutes late.  We strive to give you quality time with our providers, and arriving late affects you and other patients whose appointments are after yours.  Also, if you no show three or more times for appointments you may be dismissed from the clinic at the providers discretion.     Again, thank you for choosing [facility].  Our hope is that these requests will decrease the amount of time that you wait before being seen by our physicians.       _____________________________________________________________  Should you have questions after your visit to [facility], please contact our office at [phone] between the hours of 8:00 a.m. and 4:30 p.m.  Voicemails left after 4:00 p.m. will not be returned until the following business day.  For prescription refill requests, have your pharmacy contact our office and allow 72 hours.    Cancer Center Support Programs:   > Cancer Support Group  2nd Tuesday of the month 1pm-2pm, Journey Room

## 2019-04-05 NOTE — Assessment & Plan Note (Addendum)
1.  Intermediate grade right breast DCIS: -Screening mammogram on 01/31/2019 was BI-RADS Category 0.  Additional views of the right breast shows indeterminate 0.4 cm mass within the upper outer right breast which is new. -Right breast biopsy at 10 o'clock position on 02/15/2019 consistent with DCIS, intermediate grade, ER/PR positive. -Right partial mastectomy on 03/09/2019 showing 0.6 cm involving an intraductal papilloma, intermediate grade, in situ carcinoma less than 1 mm from inferior margin, not on ink.  Other margins negative.  Right inferior margin excision shows low-grade DCIS, 0.6 cm.  Not completely clear if it is 2 foci of DCIS. -I had a prolonged discussion about the pathology report.  I have recommended antiestrogen therapy for 5 years given her ER positive DCIS. -I have also discussed the fact that antiestrogen therapy provides risk reduction in the ipsilateral breast and in the contralateral breast in ER positive tumors.  No survival advantage has been demonstrated. -I have recommended a bone density test.  We will also check vitamin D levels. -Based on her bone density results, will decide if she is a candidate for tamoxifen or aromatase inhibitor. -She lives close to [place].  We will make a referral to radiation oncology in [place].  2.  Family history: -2 sisters had breast cancer.  Father had lymphoma.  Brother had prostate cancer. -Niece had ovarian cancer.  Paternal aunt had stomach cancer. -Given the extensive family history, I have strongly recommended genetic testing.

## 2019-04-05 NOTE — Progress Notes (Signed)
I met with patient today during the visit with Dr. Delton Coombes.  I provided my card and contact information and explained how I am involved in her care.  She voices appreciation for everything we have done for her today.  I thanked her for allowing [place] to care for her.

## 2019-04-05 NOTE — Progress Notes (Unsigned)
Referral to [facility].  Records faxed on 1/5

## 2019-04-06 ENCOUNTER — Encounter (HOSPITAL_COMMUNITY): Payer: Self-pay | Admitting: *Deleted

## 2019-04-06 NOTE — Progress Notes (Signed)
I spoke with Marlowe Kays at [place] and she is requesting images on CD for patient we referred there yesterday.  I have contacted our imaging department and asked they overnight a copy of CD with images from mammograms, and surgery. Confirmation received that it will go out today.

## 2019-04-14 ENCOUNTER — Other Ambulatory Visit (HOSPITAL_COMMUNITY): Payer: Medicare PPO

## 2019-04-15 ENCOUNTER — Other Ambulatory Visit: Payer: Self-pay

## 2019-04-15 ENCOUNTER — Ambulatory Visit (HOSPITAL_COMMUNITY)
Admission: RE | Admit: 2019-04-15 | Discharge: 2019-04-15 | Disposition: A | Discharge status: Home / Self Care | Payer: Medicare PPO | Source: Ambulatory Visit | Attending: Hematology | Admitting: Hematology

## 2019-04-15 DIAGNOSIS — Z780 Asymptomatic menopausal state: Secondary | ICD-10-CM | POA: Insufficient documentation

## 2019-04-15 DIAGNOSIS — Z13820 Encounter for screening for osteoporosis: Secondary | ICD-10-CM | POA: Diagnosis not present

## 2019-04-19 ENCOUNTER — Other Ambulatory Visit: Payer: Self-pay

## 2019-04-19 ENCOUNTER — Inpatient Hospital Stay (HOSPITAL_BASED_OUTPATIENT_CLINIC_OR_DEPARTMENT_OTHER): Payer: Medicare PPO | Admitting: Hematology

## 2019-04-19 ENCOUNTER — Encounter (HOSPITAL_COMMUNITY): Payer: Self-pay | Admitting: Hematology

## 2019-04-19 VITALS — BP 125/64 | HR 64 | Temp 97.3°F | Resp 18 | Wt 229.7 lb

## 2019-04-19 DIAGNOSIS — Z9011 Acquired absence of right breast and nipple: Secondary | ICD-10-CM | POA: Diagnosis not present

## 2019-04-19 DIAGNOSIS — Z170 Estrogen receptor positive status [ER+]: Secondary | ICD-10-CM | POA: Diagnosis not present

## 2019-04-19 DIAGNOSIS — Z87891 Personal history of nicotine dependence: Secondary | ICD-10-CM | POA: Diagnosis not present

## 2019-04-19 DIAGNOSIS — Z803 Family history of malignant neoplasm of breast: Secondary | ICD-10-CM | POA: Diagnosis not present

## 2019-04-19 DIAGNOSIS — Z800 Family history of malignant neoplasm of digestive organs: Secondary | ICD-10-CM | POA: Diagnosis not present

## 2019-04-19 DIAGNOSIS — Z79811 Long term (current) use of aromatase inhibitors: Secondary | ICD-10-CM | POA: Diagnosis not present

## 2019-04-19 DIAGNOSIS — Z807 Family history of other malignant neoplasms of lymphoid, hematopoietic and related tissues: Secondary | ICD-10-CM | POA: Diagnosis not present

## 2019-04-19 DIAGNOSIS — D0511 Intraductal carcinoma in situ of right breast: Secondary | ICD-10-CM | POA: Diagnosis not present

## 2019-04-19 DIAGNOSIS — Z8041 Family history of malignant neoplasm of ovary: Secondary | ICD-10-CM | POA: Diagnosis not present

## 2019-04-19 MED ORDER — ANASTROZOLE 1 MG PO TABS: 1.0000 mg | ORAL_TABLET | Freq: Every day | ORAL | 2 refills | Status: DC

## 2019-04-19 NOTE — Assessment & Plan Note (Signed)
1.  Intermediate grade right breast DCIS: -Screening mammogram on 01/31/2019 was BI-RADS Category 0.  Additional views of the right breast shows indeterminate 0.4 cm mass within the upper outer right breast which is new. -Right breast biopsy at 10 o'clock position on 02/15/2019 consistent with DCIS, intermediate grade, ER/PR positive. -Right partial mastectomy on 03/09/2019 showing 0.6 cm involving an intraductal papilloma, intermediate grade, in situ carcinoma less than 1 mm from inferior margin, not on ink.  Other margins negative.  Right inferior margin excision shows low-grade DCIS, 0.6 cm.  Not completely clear if it is 2 foci of DCIS. -I have also discussed the fact that antiestrogen therapy provides risk reduction in the ipsilateral breast and in the contralateral breast in ER positive tumors.  No survival advantage has been demonstrated. -She has an appointment to see radiation oncology in [place] on 04/28/2019. -We reviewed results of the bone density test on 04/15/2019.  T score is -0.7 which is normal limits. -Vitamin D level on 04/05/2019 was 41.39.  She will continue vitamin D 2000 units daily. -We talked about starting her on antiestrogen therapy with anastrozole 1 mg daily for 5 years.  We talked about side effects in detail including but not limited to hot flashes, musculoskeletal symptoms, decreased bone mineral density, rare chance of thromboembolic phenomena. -She understands and gives [place] permission to proceed with treatment.  We will see her back in 3 months for follow-up with labs.  We will also schedule her for follow-up with our survivorship clinic.  2.  Family history: -2 sisters had breast cancer.  Father had lymphoma.  Brother had prostate cancer. -Niece had ovarian cancer.  Paternal aunt had stomach cancer. -She is already scheduled for genetic testing.

## 2019-04-19 NOTE — Patient Instructions (Signed)
[place] at [facility] Discharge Instructions  You were seen today by Dr. Delton Coombes. He went over your recent lab and bone density results. Continue to take the vitamin D daily. He will send in a prescription for Anastrozole, you should take this daily at the same time, you will take this pill for 5 years. He will see you back in 4 months for labs and follow up.   Thank you for choosing [place] at [facility] to provide your oncology and hematology care.  To afford each patient quality time with our provider, please arrive at least 15 minutes before your scheduled appointment time.   If you have a lab appointment with the [place] please come in thru the  Main Entrance and check in at the main information desk  You need to re-schedule your appointment should you arrive 10 or more minutes late.  We strive to give you quality time with our providers, and arriving late affects you and other patients whose appointments are after yours.  Also, if you no show three or more times for appointments you may be dismissed from the clinic at the providers discretion.     Again, thank you for choosing [facility].  Our hope is that these requests will decrease the amount of time that you wait before being seen by our physicians.       _____________________________________________________________  Should you have questions after your visit to [facility], please contact our office at [phone] between the hours of 8:00 a.m. and 4:30 p.m.  Voicemails left after 4:00 p.m. will not be returned until the following business day.  For prescription refill requests, have your pharmacy contact our office and allow 72 hours.    Cancer Center Support Programs:   > Cancer Support Group  2nd Tuesday of the month 1pm-2pm, Journey Room

## 2019-04-19 NOTE — Progress Notes (Signed)
 [facility] [address] [place], [place] [address]   CLINIC:  Medical Oncology/Hematology  PCP:  Golding, John, MD [address] [place] [address] [phone]   REASON FOR VISIT:  Follow-up for right breast DCIS.  CURRENT THERAPY: Anastrozole 1 mg daily.  BRIEF ONCOLOGIC HISTORY:  Oncology History  Ductal carcinoma in situ (DCIS) of right breast  03/09/2019 Initial Diagnosis   Ductal carcinoma in situ (DCIS) of right breast   04/05/2019 Cancer Staging   Staging form: Breast, AJCC 8th Edition - Clinical stage from 04/05/2019: Stage 0 (cTis (DCIS), cN0, cM0, ER+, PR+, HER2: Not Assessed) - Signed by Katragadda, Sreedhar, MD on 04/05/2019      CANCER STAGING: Cancer Staging Ductal carcinoma in situ (DCIS) of right breast Staging form: Breast, AJCC 8th Edition - Clinical stage from 04/05/2019: Stage 0 (cTis (DCIS), cN0, cM0, ER+, PR+, HER2: Not Assessed) - Signed by Katragadda, Sreedhar, MD on 04/05/2019    INTERVAL HISTORY:  Chelsey Johnson 68 y.o. female seen for follow-up of right breast DCIS.  She will see radiation oncologist in the last week of this month in [place].  She had bone density test done along with blood work.  Reports some soreness in the right breast area with some palpable hematoma.    REVIEW OF SYSTEMS:  Review of Systems  Cardiovascular: Positive for leg swelling.  Neurological: Positive for numbness.  Psychiatric/Behavioral: Positive for sleep disturbance.  All other systems reviewed and are negative.    PAST MEDICAL/SURGICAL HISTORY:  Past Medical History:  Diagnosis Date  . Allergy   . Anesthesia complication    tachycardia  . Breast cancer (HCC)   . Complication of anesthesia    blood pressure drops quickly with anesthetic  . HTN (hypertension)   . Obesity    Past Surgical History:  Procedure Laterality Date  . ABDOMINAL HYSTERECTOMY  1986   complete, uterine fibroids  . BREAST BIOPSY Right    negative  .  COLONOSCOPY  09/19/2011   Procedure: COLONOSCOPY;  Surgeon: Sandi L Fields, MD;  Location: [facility];  Service: Endoscopy;  Laterality: N/A;  12:30  . PARTIAL MASTECTOMY WITH NEEDLE LOCALIZATION Right 03/09/2019   Procedure: PARTIAL MASTECTOMY WITH NEEDLE LOCALIZATION;  Surgeon: Jenkins, Mark, MD;  Location: [facility] ORS;  Service: General;  Laterality: Right;     SOCIAL HISTORY:  Social History   Socioeconomic History  . Marital status: Single    Spouse name: Not on file  . Number of children: 0  . Years of education: Not on file  . Highest education level: Not on file  Occupational History  . Occupation: [employer]    Employer: [employer]  Tobacco Use  . Smoking status: Former Smoker    Packs/day: 1.00    Years: 10.00    Pack years: 10.00    Types: Cigarettes    Quit date: 09/11/1991    Years since quitting: 27.6  . Smokeless tobacco: Never Used  . Tobacco comment: quit about 20 + years  Substance and Sexual Activity  . Alcohol use: No  . Drug use: No  . Sexual activity: Not Currently  Other Topics Concern  . Not on file  Social History Narrative   Lives w/ mother   Social Determinants of Health   Financial Resource Strain: Low Risk   . Difficulty of Paying Living Expenses: Not hard at all  Food Insecurity: No Food Insecurity  . Worried About Running Out of Food   in the Last Year: Never true  . Ran Out of Food in the Last Year: Never true  Transportation Needs: No Transportation Needs  . Lack of Transportation (Medical): No  . Lack of Transportation (Non-Medical): No  Physical Activity: Insufficiently Active  . Days of Exercise per Week: 7 days  . Minutes of Exercise per Session: 20 min  Stress: No Stress Concern Present  . Feeling of Stress : Not at all  Social Connections: Somewhat Isolated  . Frequency of Communication with Friends and Family: More than three times a week  . Frequency of Social Gatherings with Friends and  Family: More than three times a week  . Attends Religious Services: Never  . Active Member of Clubs or Organizations: No  . Attends Club or Organization Meetings: More than 4 times per year  . Marital Status: Never married  Intimate Partner Violence: Not At Risk  . Fear of Current or Ex-Partner: No  . Emotionally Abused: No  . Physically Abused: No  . Sexually Abused: No    FAMILY HISTORY:  Family History  Problem Relation Age of Onset  . Ulcers Brother   . Colon polyps Mother 70  . Congestive Heart Failure Mother   . Lymphoma Father   . Hypertension Sister   . Breast cancer Sister   . Hypertension Sister   . Breast cancer Sister   . Hypertension Sister   . Hypertension Sister     CURRENT MEDICATIONS:  Outpatient Encounter Medications as of 04/19/2019  Medication Sig  . aspirin EC 81 MG tablet Take 81 mg by mouth daily.  . Cholecalciferol (VITAMIN D) 50 MCG (2000 UT) CAPS Take 2,000 Units by mouth daily.  . magnesium oxide (MAG-OX) 400 MG tablet Take 200 mg by mouth daily.  . OMEGA 3 1200 MG CAPS Take 1,200 mg by mouth daily.   . quinapril-hydrochlorothiazide (ACCURETIC) 20-12.5 MG tablet Take 1 tablet by mouth daily.  . vitamin B-12 (CYANOCOBALAMIN) 500 MCG tablet Take 500 mcg by mouth daily.  . anastrozole (ARIMIDEX) 1 MG tablet Take 1 tablet (1 mg total) by mouth daily.  . celecoxib (CELEBREX) 200 MG capsule Take 200 mg by mouth as needed.  . traMADol (ULTRAM) 50 MG tablet Take 1 tablet (50 mg total) by mouth every 6 (six) hours as needed. (Patient not taking: Reported on 04/05/2019)   No facility-administered encounter medications on file as of 04/19/2019.    ALLERGIES:  Allergies  Allergen Reactions  . Anesthetics, Amide Palpitations    Pt reports Tachycardia w/ HRs 170's with General Anesthesia but cannot give details regarding names of medications.  . Tylox [Oxycodone-Acetaminophen] Palpitations    Tachycardia      PHYSICAL EXAM:  ECOG Performance status: 1   Vitals:   04/19/19 1126  BP: 125/64  Pulse: 64  Resp: 18  Temp: (!) 97.3 F (36.3 C)  SpO2: 100%   Filed Weights   04/19/19 1126  Weight: 229 lb 11.2 oz (104.2 kg)    Physical Exam Vitals reviewed.  Constitutional:      Appearance: Normal appearance.  Cardiovascular:     Rate and Rhythm: Normal rate and regular rhythm.     Heart sounds: Normal heart sounds.  Pulmonary:     Effort: Pulmonary effort is normal.     Breath sounds: Normal breath sounds.  Abdominal:     General: There is no distension.     Palpations: Abdomen is soft. There is no mass.  Skin:    General: Skin   is warm.  Neurological:     General: No focal deficit present.     Mental Status: She is alert and oriented to person, place, and time.  Psychiatric:        Mood and Affect: Mood normal.        Behavior: Behavior normal.    Right breast hematoma at the lumpectomy site is slightly tender but no erythema.  LABORATORY DATA:  I have reviewed the labs as listed.  CBC    Component Value Date/Time   WBC 5.8 03/07/2019 1454   RBC 4.49 03/07/2019 1454   HGB 12.9 03/07/2019 1454   HCT 41.1 03/07/2019 1454   PLT 192 03/07/2019 1454   MCV 91.5 03/07/2019 1454   MCH 28.7 03/07/2019 1454   MCHC 31.4 03/07/2019 1454   RDW 13.0 03/07/2019 1454   LYMPHSABS 1.7 03/07/2019 1454   MONOABS 0.6 03/07/2019 1454   EOSABS 0.2 03/07/2019 1454   BASOSABS 0.0 03/07/2019 1454   CMP Latest Ref Rng & Units 03/07/2019  Glucose 70 - 99 mg/dL 113(H)  BUN 8 - 23 mg/dL 19  Creatinine 0.44 - 1.00 mg/dL 0.76  Sodium 135 - 145 mmol/L 138  Potassium 3.5 - 5.1 mmol/L 4.6  Chloride 98 - 111 mmol/L 101  CO2 22 - 32 mmol/L 28  Calcium 8.9 - 10.3 mg/dL 10.0  Total Protein 6.5 - 8.1 g/dL 7.2  Total Bilirubin 0.3 - 1.2 mg/dL 0.5  Alkaline Phos 38 - 126 U/L 58  AST 15 - 41 U/L 29  ALT 0 - 44 U/L 31       DIAGNOSTIC IMAGING:  I have independently reviewed the scans and discussed with the patient.    ASSESSMENT & PLAN:    Ductal carcinoma in situ (DCIS) of right breast 1.  Intermediate grade right breast DCIS: -Screening mammogram on 01/31/2019 was BI-RADS Category 0.  Additional views of the right breast shows indeterminate 0.4 cm mass within the upper outer right breast which is new. -Right breast biopsy at 10 o'clock position on 02/15/2019 consistent with DCIS, intermediate grade, ER/PR positive. -Right partial mastectomy on 03/09/2019 showing 0.6 cm involving an intraductal papilloma, intermediate grade, in situ carcinoma less than 1 mm from inferior margin, not on ink.  Other margins negative.  Right inferior margin excision shows low-grade DCIS, 0.6 cm.  Not completely clear if it is 2 foci of DCIS. -I have also discussed the fact that antiestrogen therapy provides risk reduction in the ipsilateral breast and in the contralateral breast in ER positive tumors.  No survival advantage has been demonstrated. -She has an appointment to see radiation oncology in [place] on 04/28/2019. -We reviewed results of the bone density test on 04/15/2019.  T score is -0.7 which is normal limits. -Vitamin D level on 04/05/2019 was 41.39.  She will continue vitamin D 2000 units daily. -We talked about starting her on antiestrogen therapy with anastrozole 1 mg daily for 5 years.  We talked about side effects in detail including but not limited to hot flashes, musculoskeletal symptoms, decreased bone mineral density, rare chance of thromboembolic phenomena. -She understands and gives [place] permission to proceed with treatment.  We will see her back in 3 months for follow-up with labs.  We will also schedule her for follow-up with our survivorship clinic.  2.  Family history: -2 sisters had breast cancer.  Father had lymphoma.  Brother had prostate cancer. -Niece had ovarian cancer.  Paternal aunt had stomach cancer. -She is already scheduled for  genetic testing.   Total time spent is 30 minutes with more than 50% of time spent  face-to-face discussing scan results, new treatment plan, counseling and coordination of care.  Orders placed this encounter:  Orders Placed This Encounter  Procedures  . CBC with Differential/Platelet  . Comprehensive metabolic panel      Sreedhar Katragadda, MD [facility] [phone]  

## 2019-04-21 DIAGNOSIS — Z170 Estrogen receptor positive status [ER+]: Secondary | ICD-10-CM | POA: Diagnosis not present

## 2019-04-21 DIAGNOSIS — D0511 Intraductal carcinoma in situ of right breast: Secondary | ICD-10-CM | POA: Diagnosis not present

## 2019-04-21 DIAGNOSIS — E669 Obesity, unspecified: Secondary | ICD-10-CM | POA: Diagnosis not present

## 2019-04-21 DIAGNOSIS — Z7982 Long term (current) use of aspirin: Secondary | ICD-10-CM | POA: Diagnosis not present

## 2019-04-21 DIAGNOSIS — Z79899 Other long term (current) drug therapy: Secondary | ICD-10-CM | POA: Diagnosis not present

## 2019-04-21 DIAGNOSIS — I10 Essential (primary) hypertension: Secondary | ICD-10-CM | POA: Diagnosis not present

## 2019-04-21 DIAGNOSIS — Z87891 Personal history of nicotine dependence: Secondary | ICD-10-CM | POA: Diagnosis not present

## 2019-04-27 DIAGNOSIS — I10 Essential (primary) hypertension: Secondary | ICD-10-CM | POA: Diagnosis not present

## 2019-04-27 DIAGNOSIS — Z87891 Personal history of nicotine dependence: Secondary | ICD-10-CM | POA: Diagnosis not present

## 2019-04-27 DIAGNOSIS — Z510 Encounter for antineoplastic radiation therapy: Secondary | ICD-10-CM | POA: Diagnosis not present

## 2019-04-27 DIAGNOSIS — Z7982 Long term (current) use of aspirin: Secondary | ICD-10-CM | POA: Diagnosis not present

## 2019-04-27 DIAGNOSIS — Z170 Estrogen receptor positive status [ER+]: Secondary | ICD-10-CM | POA: Diagnosis not present

## 2019-04-27 DIAGNOSIS — Z79899 Other long term (current) drug therapy: Secondary | ICD-10-CM | POA: Diagnosis not present

## 2019-04-27 DIAGNOSIS — E669 Obesity, unspecified: Secondary | ICD-10-CM | POA: Diagnosis not present

## 2019-04-27 DIAGNOSIS — D0511 Intraductal carcinoma in situ of right breast: Secondary | ICD-10-CM | POA: Diagnosis not present

## 2019-05-03 DIAGNOSIS — D0511 Intraductal carcinoma in situ of right breast: Secondary | ICD-10-CM | POA: Diagnosis not present

## 2019-05-03 DIAGNOSIS — Z510 Encounter for antineoplastic radiation therapy: Secondary | ICD-10-CM | POA: Diagnosis not present

## 2019-05-11 DIAGNOSIS — D0511 Intraductal carcinoma in situ of right breast: Secondary | ICD-10-CM | POA: Diagnosis not present

## 2019-05-11 DIAGNOSIS — Z510 Encounter for antineoplastic radiation therapy: Secondary | ICD-10-CM | POA: Diagnosis not present

## 2019-05-12 ENCOUNTER — Other Ambulatory Visit (HOSPITAL_COMMUNITY): Payer: Medicare PPO

## 2019-05-12 ENCOUNTER — Ambulatory Visit (HOSPITAL_COMMUNITY): Payer: Medicare PPO | Admitting: Genetic Counselor

## 2019-05-12 DIAGNOSIS — D0511 Intraductal carcinoma in situ of right breast: Secondary | ICD-10-CM | POA: Diagnosis not present

## 2019-05-12 DIAGNOSIS — Z510 Encounter for antineoplastic radiation therapy: Secondary | ICD-10-CM | POA: Diagnosis not present

## 2019-05-16 DIAGNOSIS — D0511 Intraductal carcinoma in situ of right breast: Secondary | ICD-10-CM | POA: Diagnosis not present

## 2019-05-16 DIAGNOSIS — Z510 Encounter for antineoplastic radiation therapy: Secondary | ICD-10-CM | POA: Diagnosis not present

## 2019-05-17 DIAGNOSIS — Z510 Encounter for antineoplastic radiation therapy: Secondary | ICD-10-CM | POA: Diagnosis not present

## 2019-05-17 DIAGNOSIS — D0511 Intraductal carcinoma in situ of right breast: Secondary | ICD-10-CM | POA: Diagnosis not present

## 2019-05-18 DIAGNOSIS — Z510 Encounter for antineoplastic radiation therapy: Secondary | ICD-10-CM | POA: Diagnosis not present

## 2019-05-18 DIAGNOSIS — D0511 Intraductal carcinoma in situ of right breast: Secondary | ICD-10-CM | POA: Diagnosis not present

## 2019-05-20 DIAGNOSIS — Z510 Encounter for antineoplastic radiation therapy: Secondary | ICD-10-CM | POA: Diagnosis not present

## 2019-05-20 DIAGNOSIS — D0511 Intraductal carcinoma in situ of right breast: Secondary | ICD-10-CM | POA: Diagnosis not present

## 2019-05-23 DIAGNOSIS — D0511 Intraductal carcinoma in situ of right breast: Secondary | ICD-10-CM | POA: Diagnosis not present

## 2019-05-23 DIAGNOSIS — Z510 Encounter for antineoplastic radiation therapy: Secondary | ICD-10-CM | POA: Diagnosis not present

## 2019-05-24 DIAGNOSIS — Z510 Encounter for antineoplastic radiation therapy: Secondary | ICD-10-CM | POA: Diagnosis not present

## 2019-05-24 DIAGNOSIS — D0511 Intraductal carcinoma in situ of right breast: Secondary | ICD-10-CM | POA: Diagnosis not present

## 2019-05-25 DIAGNOSIS — D0511 Intraductal carcinoma in situ of right breast: Secondary | ICD-10-CM | POA: Diagnosis not present

## 2019-05-25 DIAGNOSIS — Z510 Encounter for antineoplastic radiation therapy: Secondary | ICD-10-CM | POA: Diagnosis not present

## 2019-05-26 DIAGNOSIS — D0511 Intraductal carcinoma in situ of right breast: Secondary | ICD-10-CM | POA: Diagnosis not present

## 2019-05-26 DIAGNOSIS — Z510 Encounter for antineoplastic radiation therapy: Secondary | ICD-10-CM | POA: Diagnosis not present

## 2019-05-27 DIAGNOSIS — D0511 Intraductal carcinoma in situ of right breast: Secondary | ICD-10-CM | POA: Diagnosis not present

## 2019-05-27 DIAGNOSIS — Z510 Encounter for antineoplastic radiation therapy: Secondary | ICD-10-CM | POA: Diagnosis not present

## 2019-05-30 DIAGNOSIS — Z510 Encounter for antineoplastic radiation therapy: Secondary | ICD-10-CM | POA: Diagnosis not present

## 2019-05-30 DIAGNOSIS — D0511 Intraductal carcinoma in situ of right breast: Secondary | ICD-10-CM | POA: Diagnosis not present

## 2019-05-31 DIAGNOSIS — C50212 Malignant neoplasm of upper-inner quadrant of left female breast: Secondary | ICD-10-CM | POA: Diagnosis not present

## 2019-05-31 DIAGNOSIS — D0511 Intraductal carcinoma in situ of right breast: Secondary | ICD-10-CM | POA: Diagnosis not present

## 2019-05-31 DIAGNOSIS — Z510 Encounter for antineoplastic radiation therapy: Secondary | ICD-10-CM | POA: Diagnosis not present

## 2019-06-01 DIAGNOSIS — D0511 Intraductal carcinoma in situ of right breast: Secondary | ICD-10-CM | POA: Diagnosis not present

## 2019-06-01 DIAGNOSIS — Z510 Encounter for antineoplastic radiation therapy: Secondary | ICD-10-CM | POA: Diagnosis not present

## 2019-06-02 DIAGNOSIS — D0511 Intraductal carcinoma in situ of right breast: Secondary | ICD-10-CM | POA: Diagnosis not present

## 2019-06-02 DIAGNOSIS — Z510 Encounter for antineoplastic radiation therapy: Secondary | ICD-10-CM | POA: Diagnosis not present

## 2019-06-03 DIAGNOSIS — Z510 Encounter for antineoplastic radiation therapy: Secondary | ICD-10-CM | POA: Diagnosis not present

## 2019-06-03 DIAGNOSIS — C50212 Malignant neoplasm of upper-inner quadrant of left female breast: Secondary | ICD-10-CM | POA: Diagnosis not present

## 2019-06-03 DIAGNOSIS — D0511 Intraductal carcinoma in situ of right breast: Secondary | ICD-10-CM | POA: Diagnosis not present

## 2019-06-06 DIAGNOSIS — D0511 Intraductal carcinoma in situ of right breast: Secondary | ICD-10-CM | POA: Diagnosis not present

## 2019-06-06 DIAGNOSIS — Z510 Encounter for antineoplastic radiation therapy: Secondary | ICD-10-CM | POA: Diagnosis not present

## 2019-06-07 DIAGNOSIS — Z510 Encounter for antineoplastic radiation therapy: Secondary | ICD-10-CM | POA: Diagnosis not present

## 2019-06-07 DIAGNOSIS — D0511 Intraductal carcinoma in situ of right breast: Secondary | ICD-10-CM | POA: Diagnosis not present

## 2019-06-07 DIAGNOSIS — C50212 Malignant neoplasm of upper-inner quadrant of left female breast: Secondary | ICD-10-CM | POA: Diagnosis not present

## 2019-06-09 DIAGNOSIS — D0511 Intraductal carcinoma in situ of right breast: Secondary | ICD-10-CM | POA: Diagnosis not present

## 2019-06-09 DIAGNOSIS — C50212 Malignant neoplasm of upper-inner quadrant of left female breast: Secondary | ICD-10-CM | POA: Diagnosis not present

## 2019-06-09 DIAGNOSIS — Z510 Encounter for antineoplastic radiation therapy: Secondary | ICD-10-CM | POA: Diagnosis not present

## 2019-06-10 DIAGNOSIS — D0511 Intraductal carcinoma in situ of right breast: Secondary | ICD-10-CM | POA: Diagnosis not present

## 2019-06-10 DIAGNOSIS — Z510 Encounter for antineoplastic radiation therapy: Secondary | ICD-10-CM | POA: Diagnosis not present

## 2019-06-13 DIAGNOSIS — Z510 Encounter for antineoplastic radiation therapy: Secondary | ICD-10-CM | POA: Diagnosis not present

## 2019-06-13 DIAGNOSIS — D0511 Intraductal carcinoma in situ of right breast: Secondary | ICD-10-CM | POA: Diagnosis not present

## 2019-06-14 DIAGNOSIS — Z510 Encounter for antineoplastic radiation therapy: Secondary | ICD-10-CM | POA: Diagnosis not present

## 2019-06-14 DIAGNOSIS — D0511 Intraductal carcinoma in situ of right breast: Secondary | ICD-10-CM | POA: Diagnosis not present

## 2019-07-07 DIAGNOSIS — Z6841 Body Mass Index (BMI) 40.0 and over, adult: Secondary | ICD-10-CM | POA: Diagnosis not present

## 2019-07-07 DIAGNOSIS — Z1389 Encounter for screening for other disorder: Secondary | ICD-10-CM | POA: Diagnosis not present

## 2019-07-07 DIAGNOSIS — T07XXXA Unspecified multiple injuries, initial encounter: Secondary | ICD-10-CM | POA: Diagnosis not present

## 2019-08-18 ENCOUNTER — Other Ambulatory Visit: Payer: Self-pay

## 2019-08-18 ENCOUNTER — Inpatient Hospital Stay (HOSPITAL_COMMUNITY): Payer: Medicare PPO | Attending: Hematology

## 2019-08-18 DIAGNOSIS — Z803 Family history of malignant neoplasm of breast: Secondary | ICD-10-CM | POA: Insufficient documentation

## 2019-08-18 DIAGNOSIS — Z87891 Personal history of nicotine dependence: Secondary | ICD-10-CM | POA: Insufficient documentation

## 2019-08-18 DIAGNOSIS — D0511 Intraductal carcinoma in situ of right breast: Secondary | ICD-10-CM | POA: Insufficient documentation

## 2019-08-18 DIAGNOSIS — I10 Essential (primary) hypertension: Secondary | ICD-10-CM | POA: Insufficient documentation

## 2019-08-18 DIAGNOSIS — Z170 Estrogen receptor positive status [ER+]: Secondary | ICD-10-CM | POA: Diagnosis not present

## 2019-08-18 DIAGNOSIS — E669 Obesity, unspecified: Secondary | ICD-10-CM | POA: Insufficient documentation

## 2019-08-18 LAB — CBC WITH DIFFERENTIAL/PLATELET
Abs Immature Granulocytes: 0.01 10*3/uL (ref 0.00–0.07)
Basophils Absolute: 0 10*3/uL (ref 0.0–0.1)
Basophils Relative: 0 %
Eosinophils Absolute: 0.2 10*3/uL (ref 0.0–0.5)
Eosinophils Relative: 5 %
HCT: 42.3 % (ref 36.0–46.0)
Hemoglobin: 13.2 g/dL (ref 12.0–15.0)
Immature Granulocytes: 0 %
Lymphocytes Relative: 25 %
Lymphs Abs: 1.1 10*3/uL (ref 0.7–4.0)
MCH: 28 pg (ref 26.0–34.0)
MCHC: 31.2 g/dL (ref 30.0–36.0)
MCV: 89.6 fL (ref 80.0–100.0)
Monocytes Absolute: 0.6 10*3/uL (ref 0.1–1.0)
Monocytes Relative: 14 %
Neutro Abs: 2.5 10*3/uL (ref 1.7–7.7)
Neutrophils Relative %: 56 %
Platelets: 173 10*3/uL (ref 150–400)
RBC: 4.72 MIL/uL (ref 3.87–5.11)
RDW: 13.1 % (ref 11.5–15.5)
WBC: 4.5 10*3/uL (ref 4.0–10.5)
nRBC: 0 % (ref 0.0–0.2)

## 2019-08-18 LAB — COMPREHENSIVE METABOLIC PANEL
ALT: 18 U/L (ref 0–44)
AST: 20 U/L (ref 15–41)
Albumin: 3.9 g/dL (ref 3.5–5.0)
Alkaline Phosphatase: 59 U/L (ref 38–126)
Anion gap: 10 (ref 5–15)
BUN: 17 mg/dL (ref 8–23)
CO2: 28 mmol/L (ref 22–32)
Calcium: 10.3 mg/dL (ref 8.9–10.3)
Chloride: 103 mmol/L (ref 98–111)
Creatinine, Ser: 0.69 mg/dL (ref 0.44–1.00)
GFR calc Af Amer: >60 mL/min (ref >60)
GFR calc non Af Amer: >60 mL/min (ref >60)
Glucose, Bld: 107 mg/dL — ABNORMAL HIGH (ref 70–99)
Potassium: 4.3 mmol/L (ref 3.5–5.1)
Sodium: 141 mmol/L (ref 135–145)
Total Bilirubin: 0.7 mg/dL (ref 0.3–1.2)
Total Protein: 7 g/dL (ref 6.5–8.1)

## 2019-08-25 ENCOUNTER — Encounter (HOSPITAL_COMMUNITY): Payer: Self-pay | Admitting: Nurse Practitioner

## 2019-08-25 ENCOUNTER — Inpatient Hospital Stay (HOSPITAL_COMMUNITY): Payer: Medicare PPO | Admitting: Nurse Practitioner

## 2019-08-25 ENCOUNTER — Other Ambulatory Visit: Payer: Self-pay

## 2019-08-25 ENCOUNTER — Inpatient Hospital Stay (HOSPITAL_BASED_OUTPATIENT_CLINIC_OR_DEPARTMENT_OTHER): Payer: Medicare PPO | Admitting: Nurse Practitioner

## 2019-08-25 DIAGNOSIS — Z87891 Personal history of nicotine dependence: Secondary | ICD-10-CM | POA: Diagnosis not present

## 2019-08-25 DIAGNOSIS — D0511 Intraductal carcinoma in situ of right breast: Secondary | ICD-10-CM | POA: Diagnosis not present

## 2019-08-25 DIAGNOSIS — I10 Essential (primary) hypertension: Secondary | ICD-10-CM | POA: Diagnosis not present

## 2019-08-25 DIAGNOSIS — Z803 Family history of malignant neoplasm of breast: Secondary | ICD-10-CM | POA: Diagnosis not present

## 2019-08-25 DIAGNOSIS — E669 Obesity, unspecified: Secondary | ICD-10-CM | POA: Diagnosis not present

## 2019-08-25 DIAGNOSIS — Z170 Estrogen receptor positive status [ER+]: Secondary | ICD-10-CM | POA: Diagnosis not present

## 2019-08-25 NOTE — Progress Notes (Signed)
[place], [place] [address]   CLINIC:  Medical Oncology/Hematology  PCP:  Sharilyn Sites, [place] [address] [phone]   REASON FOR VISIT: Follow-up for DCIS of the right breast   CURRENT THERAPY: Anastrozole  BRIEF ONCOLOGIC HISTORY:  Oncology History  Ductal carcinoma in situ (DCIS) of right breast  03/09/2019 Initial Diagnosis   Ductal carcinoma in situ (DCIS) of right breast   04/05/2019 Cancer Staging   Staging form: Breast, AJCC 8th Edition - Clinical stage from 04/05/2019: Stage 0 (cTis (DCIS), cN0, cM0, ER+, PR+, HER2: Not Assessed) - Signed by Derek Jack, MD on 04/05/2019     CANCER STAGING: Cancer Staging Ductal carcinoma in situ (DCIS) of right breast Staging form: Breast, AJCC 8th Edition - Clinical stage from 04/05/2019: Stage 0 (cTis (DCIS), cN0, cM0, ER+, PR+, HER2: Not Assessed) - Signed by Derek Jack, MD on 04/05/2019    INTERVAL HISTORY:  Ms. Lippy 69 y.o. female returns for routine follow-up for DCIS of the right breast.  Patient reports she is doing well since her last visit.  She is taking her medication as prescribed.  She has no unwanted side effects at this time.  She denies any new lumps or bumps present.  She denies any new bone pain. Denies any nausea, vomiting, or diarrhea. Denies any new pains. Had not noticed any recent bleeding such as epistaxis, hematuria or hematochezia. Denies recent chest pain on exertion, shortness of breath on minimal exertion, pre-syncopal episodes, or palpitations. Denies any numbness or tingling in hands or feet. Denies any recent fevers, infections, or recent hospitalizations. Patient reports appetite at 100% and energy level at 100%.  She is eating well maintain her weight at this time.    REVIEW OF SYSTEMS:  Review of Systems  All other systems reviewed and are negative.    PAST MEDICAL/SURGICAL HISTORY:  Past Medical History:    Diagnosis Date  . Allergy   . Anesthesia complication    tachycardia  . Breast cancer ([facility])   . Complication of anesthesia    blood pressure drops quickly with anesthetic  . HTN (hypertension)   . Obesity    Past Surgical History:  Procedure Laterality Date  . ABDOMINAL HYSTERECTOMY  1986   complete, uterine fibroids  . BREAST BIOPSY Right    negative  . COLONOSCOPY  09/19/2011   Procedure: COLONOSCOPY;  Surgeon: Danie Binder, MD;  Location: [facility];  Service: Endoscopy;  Laterality: N/A;  12:30  . PARTIAL MASTECTOMY WITH NEEDLE LOCALIZATION Right 03/09/2019   Procedure: PARTIAL MASTECTOMY WITH NEEDLE LOCALIZATION;  Surgeon: Aviva Signs, MD;  Location: [facility];  Service: General;  Laterality: Right;     SOCIAL HISTORY:  Social History   Socioeconomic History  . Marital status: Single    Spouse name: Not on file  . Number of children: 0  . Years of education: Not on file  . Highest education level: Not on file  Occupational History  . 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 Smoking status: Former Smoker    Packs/day: 1.00    Years: 10.00    Pack years: 10.00    Types: Cigarettes    Quit date: 09/11/1991    Years since quitting: 27.9  . Smokeless tobacco: Never Used  . Tobacco comment: quit about 20 + years  Substance and Sexual Activity  . Alcohol use: No  .  Drug use: No  . Sexual activity: Not Currently  Other Topics Concern  . Not on file  Social History Narrative   Lives w/ mother   Social Determinants of Health   Financial Resource Strain: Low Risk   . Difficulty of Paying Living Expenses: Not hard at all  Food Insecurity: No Food Insecurity  . Worried About Charity fundraiser in the Last Year: Never true  . Ran Out of Food in the Last Year: Never true  Transportation Needs: No Transportation Needs  . Lack of Transportation (Medical): No  . Lack of Transportation (Non-Medical): No   Physical Activity: Insufficiently Active  . Days of Exercise per Week: 7 days  . Minutes of Exercise per Session: 20 min  Stress: No Stress Concern Present  . Feeling of Stress : Not at all  Social Connections: Somewhat Isolated  . Frequency of Communication with Friends and Family: More than three times a week  . Frequency of Social Gatherings with Friends and Family: More than three times a week  . Attends Religious Services: Never  . Active Member of Clubs or Organizations: No  . Attends Archivist Meetings: More than 4 times per year  . Marital Status: Never married  Intimate Partner Violence: Not At Risk  . Fear of Current or Ex-Partner: No  . Emotionally Abused: No  . Physically Abused: No  . Sexually Abused: No    FAMILY HISTORY:  Family History  Problem Relation Age of Onset  . Ulcers Brother   . Colon polyps Mother 75  . Congestive Heart Failure Mother   . Lymphoma Father   . Hypertension Sister   . Breast cancer Sister   . Hypertension Sister   . Breast cancer Sister   . Hypertension Sister   . Hypertension Sister     CURRENT MEDICATIONS:  Outpatient Encounter Medications as of 08/25/2019  Medication Sig  . anastrozole (ARIMIDEX) 1 MG tablet Take 1 tablet (1 mg total) by mouth daily.  Marland Kitchen aspirin EC 81 MG tablet Take 81 mg by mouth daily.  . celecoxib (CELEBREX) 200 MG capsule Take 200 mg by mouth as needed.  . Cholecalciferol (VITAMIN D) 50 MCG (2000 UT) CAPS Take 2,000 Units by mouth daily.  . magnesium oxide (MAG-OX) 400 MG tablet Take 200 mg by mouth daily.  . OMEGA 3 1200 MG CAPS Take 1,200 mg by mouth daily.   . quinapril-hydrochlorothiazide (ACCURETIC) 20-12.5 MG tablet Take 1 tablet by mouth daily.  . traMADol (ULTRAM) 50 MG tablet Take 1 tablet (50 mg total) by mouth every 6 (six) hours as needed. (Patient not taking: Reported on 08/25/2019)  . vitamin B-12 (CYANOCOBALAMIN) 500 MCG tablet Take 500 mcg by mouth daily.   No facility-administered  encounter medications on file as of 08/25/2019.    ALLERGIES:  Allergies  Allergen Reactions  . Anesthetics, Amide Palpitations    Pt reports Tachycardia w/ HRs 170's with General Anesthesia but cannot give details regarding names of medications.  Roselee Culver [Oxycodone-Acetaminophen] Palpitations    Tachycardia      PHYSICAL EXAM:  ECOG Performance status: 1  Vitals:   08/25/19 1142  BP: (!) 150/106  Pulse: 92  Resp: 16  Temp: (!) 96.8 F (36 C)  SpO2: 98%   Filed Weights   08/25/19 1142  Weight: 228 lb 6.4 oz (103.6 kg)    Physical Exam Constitutional:      Appearance: Normal appearance. She is normal weight.  Cardiovascular:  Rate and Rhythm: Normal rate and regular rhythm.     Heart sounds: Normal heart sounds.  Pulmonary:     Effort: Pulmonary effort is normal.     Breath sounds: Normal breath sounds.  Abdominal:     General: Bowel sounds are normal.     Palpations: Abdomen is soft.  Musculoskeletal:        General: Normal range of motion.  Skin:    General: Skin is warm.  Neurological:     Mental Status: She is alert and oriented to person, place, and time. Mental status is at baseline.  Psychiatric:        Mood and Affect: Mood normal.        Behavior: Behavior normal.        Thought Content: Thought content normal.        Judgment: Judgment normal.      LABORATORY DATA:  I have reviewed the labs as listed.  CBC    Component Value Date/Time   WBC 4.5 08/18/2019 1219   RBC 4.72 08/18/2019 1219   HGB 13.2 08/18/2019 1219   HCT 42.3 08/18/2019 1219   PLT 173 08/18/2019 1219   MCV 89.6 08/18/2019 1219   MCH 28.0 08/18/2019 1219   MCHC 31.2 08/18/2019 1219   RDW 13.1 08/18/2019 1219   LYMPHSABS 1.1 08/18/2019 1219   MONOABS 0.6 08/18/2019 1219   EOSABS 0.2 08/18/2019 1219   BASOSABS 0.0 08/18/2019 1219   CMP Latest Ref Rng & Units 08/18/2019 03/07/2019  Glucose 70 - 99 mg/dL 107(H) 113(H)  BUN 8 - 23 mg/dL 17 19  Creatinine 0.44 - 1.00  mg/dL 0.69 0.76  Sodium 135 - 145 mmol/L 141 138  Potassium 3.5 - 5.1 mmol/L 4.3 4.6  Chloride 98 - 111 mmol/L 103 101  CO2 22 - 32 mmol/L 28 28  Calcium 8.9 - 10.3 mg/dL 10.3 10.0  Total Protein 6.5 - 8.1 g/dL 7.0 7.2  Total Bilirubin 0.3 - 1.2 mg/dL 0.7 0.5  Alkaline Phos 38 - 126 U/L 59 58  AST 15 - 41 U/L 20 29  ALT 0 - 44 U/L 18 31    All questions were answered to patient's stated satisfaction. Encouraged patient to call with any new concerns or questions before his next visit to the cancer center and we can certain see him sooner, if needed.     ASSESSMENT & PLAN:  Ductal carcinoma in situ (DCIS) of right breast 1.  Intermediate grade right breast DCIS: -Screening mammogram on 01/31/2019 was B RADS category 0.  Additional views of the right breast showed intermediate 0.4 cm mass within the upper outer right breast which is new. -Right breast biopsy at 10 o'clock position on 02/15/2019 consistent with DCIS, intermediate grade, ER/PR positive. -Right partial mastectomy on 03/09/2019 showing 0.6 cm involving an intraductal papilloma, intermediate grade, in situ carcinoma less than 1 mm from inferior margin, not on ink.  Other margins negative.  Right inferior margin excision shows low-grade DCIS, 0.6 cm.  Not completely clear if it is a 2 foci or DCIS. -She had an appointment with radiation oncology in [place] on 04/28/2019. -She started anastrozole in January 2021.  We will continue this for a minimum of 5 years. -Labs done on 08/18/2019 were all WNL.  She is due for her next mammogram in November 2021. -She will follow-up in 4 months with repeat labs.  We will also set her up with a survivorship appointment.  2.  Bone density: -Patient had  a bone density test on 04/15/2019 prior to starting anastrozole.  Her T score is -0.7 which is normal. -She is also taking vitamin D 2000 units daily.  She is also taking calcium. -Vitamin D level on 04/05/2019 was 41.39.  3.  Family history: -2  sisters had breast cancer. -Father had lymphoma. -Brother had prostate cancer -niece had ovarian cancer -paternal aunt had stomach cancer -she is scheduled for genetic counseling.     Orders placed this encounter:  Orders Placed This Encounter  Procedures  . Lactate dehydrogenase  . CBC with Differential/Platelet  . Comprehensive metabolic panel  . Vitamin B12  . VITAMIN D 25 Hydroxy (Vit-D Deficiency, Fractures)  . Folate      Francene Finders, FNP-C [place] [phone]

## 2019-08-25 NOTE — Patient Instructions (Addendum)
[facility]  Discharge Instructions: Follow up in 4 months with labs   You saw Randi Lockamy, NP, today. _______________________________________________________________  Thank you for choosing [facility] at [facility] to provide your oncology and hematology care.  To afford each patient quality time with our providers, please arrive at least 15 minutes before your scheduled appointment.  You need to re-schedule your appointment if you arrive 10 or more minutes late.  We strive to give you quality time with our providers, and arriving late affects you and other patients whose appointments are after yours.  Also, if you no show three or more times for appointments you may be dismissed from the clinic.  Again, thank you for choosing [facility] at [facility]. Our hope is that these requests will allow you access to exceptional care and in a timely manner. _______________________________________________________________  If you have questions after your visit, please contact our office at [phone] between the hours of 8:30 a.m. and 5:00 p.m. Voicemails left after 4:30 p.m. will not be returned until the following business day. _______________________________________________________________  For prescription refill requests, have your pharmacy contact our office. _______________________________________________________________  Recommendations made by the consultant and any test results will be sent to your referring physician. _______________________________________________________________ 

## 2019-08-25 NOTE — Assessment & Plan Note (Signed)
1.  Intermediate grade right breast DCIS: -Screening mammogram on 01/31/2019 was B RADS category 0.  Additional views of the right breast showed intermediate 0.4 cm mass within the upper outer right breast which is new. -Right breast biopsy at 10 o'clock position on 02/15/2019 consistent with DCIS, intermediate grade, ER/PR positive. -Right partial mastectomy on 03/09/2019 showing 0.6 cm involving an intraductal papilloma, intermediate grade, in situ carcinoma less than 1 mm from inferior margin, not on ink.  Other margins negative.  Right inferior margin excision shows low-grade DCIS, 0.6 cm.  Not completely clear if it is a 2 foci or DCIS. -She had an appointment with radiation oncology in [place] on 04/28/2019. -She started anastrozole in January 2021.  We will continue this for a minimum of 5 years. -Labs done on 08/18/2019 were all WNL.  She is due for her next mammogram in November 2021. -She will follow-up in 4 months with repeat labs.  We will also set her up with a survivorship appointment.  2.  Bone density: -Patient had a bone density test on 04/15/2019 prior to starting anastrozole.  Her T score is -0.7 which is normal. -She is also taking vitamin D 2000 units daily.  She is also taking calcium. -Vitamin D level on 04/05/2019 was 41.39.  3.  Family history: -2 sisters had breast cancer. -Father had lymphoma. -Brother had prostate cancer -niece had ovarian cancer -paternal aunt had stomach cancer -she is scheduled for genetic counseling.

## 2019-09-02 ENCOUNTER — Other Ambulatory Visit (HOSPITAL_COMMUNITY): Payer: Self-pay | Admitting: *Deleted

## 2019-09-02 MED ORDER — ANASTROZOLE 1 MG PO TABS: 1.0000 mg | ORAL_TABLET | Freq: Every day | ORAL | 2 refills | Status: DC

## 2019-09-05 ENCOUNTER — Other Ambulatory Visit (HOSPITAL_COMMUNITY): Payer: Self-pay | Admitting: *Deleted

## 2019-11-18 DIAGNOSIS — Z0000 Encounter for general adult medical examination without abnormal findings: Secondary | ICD-10-CM | POA: Diagnosis not present

## 2019-11-18 DIAGNOSIS — E7849 Other hyperlipidemia: Secondary | ICD-10-CM | POA: Diagnosis not present

## 2019-11-18 DIAGNOSIS — E559 Vitamin D deficiency, unspecified: Secondary | ICD-10-CM | POA: Diagnosis not present

## 2019-11-18 DIAGNOSIS — I10 Essential (primary) hypertension: Secondary | ICD-10-CM | POA: Diagnosis not present

## 2019-11-18 DIAGNOSIS — Z1389 Encounter for screening for other disorder: Secondary | ICD-10-CM | POA: Diagnosis not present

## 2019-11-18 DIAGNOSIS — Z0001 Encounter for general adult medical examination with abnormal findings: Secondary | ICD-10-CM | POA: Diagnosis not present

## 2019-11-18 DIAGNOSIS — Z6841 Body Mass Index (BMI) 40.0 and over, adult: Secondary | ICD-10-CM | POA: Diagnosis not present

## 2019-11-18 DIAGNOSIS — B351 Tinea unguium: Secondary | ICD-10-CM | POA: Diagnosis not present

## 2019-11-18 DIAGNOSIS — R7309 Other abnormal glucose: Secondary | ICD-10-CM | POA: Diagnosis not present

## 2019-12-07 DIAGNOSIS — Z923 Personal history of irradiation: Secondary | ICD-10-CM | POA: Diagnosis not present

## 2019-12-07 DIAGNOSIS — D0511 Intraductal carcinoma in situ of right breast: Secondary | ICD-10-CM | POA: Diagnosis not present

## 2019-12-09 ENCOUNTER — Other Ambulatory Visit (HOSPITAL_COMMUNITY): Payer: Self-pay | Admitting: *Deleted

## 2019-12-09 MED ORDER — ANASTROZOLE 1 MG PO TABS: 1.0000 mg | ORAL_TABLET | Freq: Every day | ORAL | 2 refills | Status: DC

## 2019-12-27 ENCOUNTER — Inpatient Hospital Stay (HOSPITAL_COMMUNITY): Payer: Medicare PPO

## 2019-12-27 ENCOUNTER — Encounter (HOSPITAL_COMMUNITY): Payer: Self-pay

## 2019-12-27 ENCOUNTER — Inpatient Hospital Stay (HOSPITAL_COMMUNITY): Payer: Medicare PPO | Attending: Hematology

## 2019-12-27 ENCOUNTER — Other Ambulatory Visit: Payer: Self-pay

## 2019-12-27 ENCOUNTER — Inpatient Hospital Stay (HOSPITAL_COMMUNITY): Payer: Medicare PPO | Admitting: Nurse Practitioner

## 2019-12-27 DIAGNOSIS — Z79811 Long term (current) use of aromatase inhibitors: Secondary | ICD-10-CM | POA: Diagnosis not present

## 2019-12-27 DIAGNOSIS — D0511 Intraductal carcinoma in situ of right breast: Secondary | ICD-10-CM | POA: Insufficient documentation

## 2019-12-27 LAB — COMPREHENSIVE METABOLIC PANEL
ALT: 17 U/L (ref 0–44)
AST: 17 U/L (ref 15–41)
Albumin: 3.7 g/dL (ref 3.5–5.0)
Alkaline Phosphatase: 55 U/L (ref 38–126)
Anion gap: 7 (ref 5–15)
BUN: 18 mg/dL (ref 8–23)
CO2: 28 mmol/L (ref 22–32)
Calcium: 10.1 mg/dL (ref 8.9–10.3)
Chloride: 105 mmol/L (ref 98–111)
Creatinine, Ser: 0.71 mg/dL (ref 0.44–1.00)
GFR calc Af Amer: >60 mL/min (ref >60)
GFR calc non Af Amer: >60 mL/min (ref >60)
Glucose, Bld: 98 mg/dL (ref 70–99)
Potassium: 4.3 mmol/L (ref 3.5–5.1)
Sodium: 140 mmol/L (ref 135–145)
Total Bilirubin: 0.4 mg/dL (ref 0.3–1.2)
Total Protein: 6.6 g/dL (ref 6.5–8.1)

## 2019-12-27 LAB — VITAMIN D 25 HYDROXY (VIT D DEFICIENCY, FRACTURES): Vit D, 25-Hydroxy: 42.82 ng/mL (ref 30–100)

## 2019-12-27 LAB — CBC WITH DIFFERENTIAL/PLATELET
Abs Immature Granulocytes: 0.01 10*3/uL (ref 0.00–0.07)
Basophils Absolute: 0 10*3/uL (ref 0.0–0.1)
Basophils Relative: 0 %
Eosinophils Absolute: 0.2 10*3/uL (ref 0.0–0.5)
Eosinophils Relative: 5 %
HCT: 38.9 % (ref 36.0–46.0)
Hemoglobin: 12.5 g/dL (ref 12.0–15.0)
Immature Granulocytes: 0 %
Lymphocytes Relative: 26 %
Lymphs Abs: 1 10*3/uL (ref 0.7–4.0)
MCH: 28.3 pg (ref 26.0–34.0)
MCHC: 32.1 g/dL (ref 30.0–36.0)
MCV: 88.2 fL (ref 80.0–100.0)
Monocytes Absolute: 0.5 10*3/uL (ref 0.1–1.0)
Monocytes Relative: 13 %
Neutro Abs: 2.2 10*3/uL (ref 1.7–7.7)
Neutrophils Relative %: 56 %
Platelets: 162 10*3/uL (ref 150–400)
RBC: 4.41 MIL/uL (ref 3.87–5.11)
RDW: 13.1 % (ref 11.5–15.5)
WBC: 3.8 10*3/uL — ABNORMAL LOW (ref 4.0–10.5)
nRBC: 0 % (ref 0.0–0.2)

## 2019-12-27 LAB — FOLATE: Folate: 10.2 ng/mL (ref >5.9)

## 2019-12-27 LAB — LACTATE DEHYDROGENASE: LDH: 141 U/L (ref 98–192)

## 2019-12-27 LAB — VITAMIN B12: Vitamin B-12: 511 pg/mL (ref 180–914)

## 2019-12-30 ENCOUNTER — Other Ambulatory Visit: Payer: Self-pay

## 2019-12-30 ENCOUNTER — Inpatient Hospital Stay (HOSPITAL_COMMUNITY): Payer: Medicare PPO | Attending: Nurse Practitioner | Admitting: Nurse Practitioner

## 2019-12-30 ENCOUNTER — Telehealth (HOSPITAL_COMMUNITY): Payer: Medicare PPO | Admitting: Nurse Practitioner

## 2019-12-30 VITALS — BP 151/75 | HR 74 | Temp 96.8°F | Resp 18 | Wt 226.6 lb

## 2019-12-30 DIAGNOSIS — Z803 Family history of malignant neoplasm of breast: Secondary | ICD-10-CM | POA: Diagnosis not present

## 2019-12-30 DIAGNOSIS — B372 Candidiasis of skin and nail: Secondary | ICD-10-CM

## 2019-12-30 DIAGNOSIS — Z9011 Acquired absence of right breast and nipple: Secondary | ICD-10-CM | POA: Diagnosis not present

## 2019-12-30 DIAGNOSIS — Z807 Family history of other malignant neoplasms of lymphoid, hematopoietic and related tissues: Secondary | ICD-10-CM | POA: Diagnosis not present

## 2019-12-30 DIAGNOSIS — Z170 Estrogen receptor positive status [ER+]: Secondary | ICD-10-CM | POA: Diagnosis not present

## 2019-12-30 DIAGNOSIS — Z90710 Acquired absence of both cervix and uterus: Secondary | ICD-10-CM | POA: Insufficient documentation

## 2019-12-30 DIAGNOSIS — D0511 Intraductal carcinoma in situ of right breast: Secondary | ICD-10-CM | POA: Diagnosis not present

## 2019-12-30 DIAGNOSIS — Z87891 Personal history of nicotine dependence: Secondary | ICD-10-CM | POA: Insufficient documentation

## 2019-12-30 DIAGNOSIS — Z8041 Family history of malignant neoplasm of ovary: Secondary | ICD-10-CM | POA: Insufficient documentation

## 2019-12-30 DIAGNOSIS — Z79899 Other long term (current) drug therapy: Secondary | ICD-10-CM | POA: Diagnosis not present

## 2019-12-30 MED ORDER — NYSTATIN 100000 UNIT/GM EX POWD: 1.0000 "application " | Freq: Three times a day (TID) | CUTANEOUS | 0 refills | Status: DC

## 2019-12-30 NOTE — Assessment & Plan Note (Signed)
1.  Intermediate grade right breast DCIS: -Screening mammogram on 01/31/2019 was B RADS category 0.  Additional views of the right breast showed intermediate 0.4 cm mass within the upper outer right breast which is new. -Right breast biopsy at 10 o'clock position on 02/15/2019 consistent with DCIS, intermediate grade, ER/PR positive. -Right partial mastectomy on 03/09/2019 showing 0.6 cm involving an intraductal papilloma, intermediate grade, in situ carcinoma less than 1 mm from inferior margin, not on ink.  Other margins negative.  Right inferior margin excision shows low-grade DCIS, 0.6 cm.  Not completely clear if it is a 2 foci or DCIS. -She had an appointment with radiation oncology in [place] on 04/28/2019. -She started anastrozole in January 2021.  We will continue this for a minimum of 5 years. -Labs done on 12/27/2019 were all WNL.  She is due for her next mammogram in November 2021. -She will follow-up in 4 months with repeat labs.  We will also set her up with a survivorship appointment.  2.  Bone density: -Patient had a bone density test on 04/15/2019 prior to starting anastrozole.  Her T score is -0.7 which is normal. -She is also taking vitamin D 2000 units daily.  She is also taking calcium.  3.  Family history: -2 sisters had breast cancer. -Father had lymphoma. -Brother had prostate cancer -niece had ovarian cancer -paternal aunt had stomach cancer -she is scheduled for genetic counseling.

## 2019-12-30 NOTE — Progress Notes (Signed)
Chelsey Johnson, [place] [address]   CLINIC:  Medical Oncology/Hematology  PCP:  Sharilyn Sites, [place] [id number] [phone]   REASON FOR VISIT: Follow-up for breast cancer   CURRENT THERAPY: Anastrozole  BRIEF ONCOLOGIC HISTORY:  Oncology History  Ductal carcinoma in situ (DCIS) of right breast  03/09/2019 Initial Diagnosis   Ductal carcinoma in situ (DCIS) of right breast   04/05/2019 Cancer Staging   Staging form: Breast, AJCC 8th Edition - Clinical stage from 04/05/2019: Stage 0 (cTis (DCIS), cN0, cM0, ER+, PR+, HER2: Not Assessed) - Signed by Derek Jack, MD on 04/05/2019     CANCER STAGING: Cancer Staging Ductal carcinoma in situ (DCIS) of right breast Staging form: Breast, AJCC 8th Edition - Clinical stage from 04/05/2019: Stage 0 (cTis (DCIS), cN0, cM0, ER+, PR+, HER2: Not Assessed) - Signed by Derek Jack, MD on 04/05/2019 - Pathologic: No stage assigned - Unsigned    INTERVAL HISTORY:  Chelsey Johnson 69 y.o. female returns for routine follow-up for breast cancer.  Patient reports she is doing well since her last visit.  She denies any new lumps or bumps present.  She denies any bone pain. Denies any nausea, vomiting, or diarrhea. Denies any new pains. Had not noticed any recent bleeding such as epistaxis, hematuria or hematochezia. Denies recent chest pain on exertion, shortness of breath on minimal exertion, pre-syncopal episodes, or palpitations. Denies any numbness or tingling in hands or feet. Denies any recent fevers, infections, or recent hospitalizations. Patient reports appetite at 100% and energy level at 75%.     REVIEW OF SYSTEMS:  Review of Systems  Gastrointestinal: Positive for nausea.  Neurological: Positive for headaches and numbness.  All other systems reviewed and are negative.    PAST MEDICAL/SURGICAL HISTORY:  Past Medical History:  Diagnosis Date  . Allergy   .  Anesthesia complication    tachycardia  . Breast cancer ([place])   . Complication of anesthesia    blood pressure drops quickly with anesthetic  . HTN (hypertension)   . Obesity    Past Surgical History:  Procedure Laterality Date  . ABDOMINAL HYSTERECTOMY  1986   complete, uterine fibroids  . BREAST BIOPSY Right    negative  . COLONOSCOPY  09/19/2011   Procedure: COLONOSCOPY;  Surgeon: Danie Binder, MD;  Location: [facility];  Service: Endoscopy;  Laterality: N/A;  12:30  . PARTIAL MASTECTOMY WITH NEEDLE LOCALIZATION Right 03/09/2019   Procedure: PARTIAL MASTECTOMY WITH NEEDLE LOCALIZATION;  Surgeon: Aviva Signs, MD;  Location: [facility];  Service: General;  Laterality: Right;     SOCIAL HISTORY:  Social History   Socioeconomic History  . Marital status: Single    Spouse name: Not on file  . Number of children: 0  . Years of education: Not on file  . Highest education level: Not on file  Occupational History  . 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 Smoking status: Former Smoker    Packs/day: 1.00    Years: 10.00    Pack years: 10.00    Types: Cigarettes    Quit date: 09/11/1991    Years since quitting: 28.3  . Smokeless tobacco: Never Used  . Tobacco comment: quit about 20 + years  Vaping Use  . Vaping Use: Never used  Substance and Sexual Activity  . Alcohol use: No  . Drug use: No  . Sexual activity:  Not Currently  Other Topics Concern  . Not on file  Social History Narrative   Lives w/ mother   Social Determinants of Health   Financial Resource Strain: Low Risk   . Difficulty of Paying Living Expenses: Not hard at all  Food Insecurity: No Food Insecurity  . Worried About Charity fundraiser in the Last Year: Never true  . Ran Out of Food in the Last Year: Never true  Transportation Needs: No Transportation Needs  . Lack of Transportation (Medical): No  . Lack of Transportation (Non-Medical):  No  Physical Activity: Insufficiently Active  . Days of Exercise per Week: 7 days  . Minutes of Exercise per Session: 20 min  Stress: No Stress Concern Present  . Feeling of Stress : Not at all  Social Connections: Moderately Isolated  . Frequency of Communication with Friends and Family: More than three times a week  . Frequency of Social Gatherings with Friends and Family: More than three times a week  . Attends Religious Services: Never  . Active Member of Clubs or Organizations: No  . Attends Archivist Meetings: More than 4 times per year  . Marital Status: Never married  Intimate Partner Violence: Not At Risk  . Fear of Current or Ex-Partner: No  . Emotionally Abused: No  . Physically Abused: No  . Sexually Abused: No    FAMILY HISTORY:  Family History  Problem Relation Age of Onset  . Ulcers Brother   . Colon polyps Mother 9  . Congestive Heart Failure Mother   . Lymphoma Father   . Hypertension Sister   . Breast cancer Sister   . Hypertension Sister   . Breast cancer Sister   . Hypertension Sister   . Hypertension Sister     CURRENT MEDICATIONS:  Outpatient Encounter Medications as of 12/30/2019  Medication Sig  . anastrozole (ARIMIDEX) 1 MG tablet Take 1 tablet (1 mg total) by mouth daily.  Marland Kitchen aspirin EC 81 MG tablet Take 81 mg by mouth daily.  . celecoxib (CELEBREX) 200 MG capsule Take 200 mg by mouth as needed.  . Cholecalciferol (VITAMIN D) 50 MCG (2000 UT) CAPS Take 2,000 Units by mouth daily.  . magnesium oxide (MAG-OX) 400 MG tablet Take 200 mg by mouth daily.  . OMEGA 3 1200 MG CAPS Take 1,200 mg by mouth daily.   . quinapril-hydrochlorothiazide (ACCURETIC) 20-12.5 MG tablet Take 1 tablet by mouth daily.  . traMADol (ULTRAM) 50 MG tablet Take 1 tablet (50 mg total) by mouth every 6 (six) hours as needed.  . vitamin B-12 (CYANOCOBALAMIN) 500 MCG tablet Take 500 mcg by mouth daily.   No facility-administered encounter medications on file as of  12/30/2019.    ALLERGIES:  Allergies  Allergen Reactions  . Anesthetics, Amide Palpitations    Pt reports Tachycardia w/ HRs 170's with General Anesthesia but cannot give details regarding names of medications.  Roselee Culver [Oxycodone-Acetaminophen] Palpitations    Tachycardia      PHYSICAL EXAM:  ECOG Performance status: 1  Vitals:   12/30/19 0912  BP: (!) 151/75  Pulse: 74  Resp: 18  Temp: (!) 96.8 F (36 C)  SpO2: 100%   Filed Weights   12/30/19 0912  Weight: 226 lb 9.6 oz (102.8 kg)   Physical Exam Constitutional:      Appearance: Normal appearance. She is normal weight.  Cardiovascular:     Rate and Rhythm: Normal rate and regular rhythm.     Heart  sounds: Normal heart sounds.  Pulmonary:     Effort: Pulmonary effort is normal.     Breath sounds: Normal breath sounds.  Abdominal:     General: Bowel sounds are normal.     Palpations: Abdomen is soft.  Musculoskeletal:        General: Normal range of motion.  Skin:    General: Skin is warm.  Neurological:     Mental Status: She is alert and oriented to person, place, and time. Mental status is at baseline.  Psychiatric:        Mood and Affect: Mood normal.        Behavior: Behavior normal.        Thought Content: Thought content normal.        Judgment: Judgment normal.      LABORATORY DATA:  I have reviewed the labs as listed.  CBC    Component Value Date/Time   WBC 3.8 (L) 12/27/2019 1235   RBC 4.41 12/27/2019 1235   HGB 12.5 12/27/2019 1235   HCT 38.9 12/27/2019 1235   PLT 162 12/27/2019 1235   MCV 88.2 12/27/2019 1235   MCH 28.3 12/27/2019 1235   MCHC 32.1 12/27/2019 1235   RDW 13.1 12/27/2019 1235   LYMPHSABS 1.0 12/27/2019 1235   MONOABS 0.5 12/27/2019 1235   EOSABS 0.2 12/27/2019 1235   BASOSABS 0.0 12/27/2019 1235   CMP Latest Ref Rng & Units 12/27/2019 08/18/2019 03/07/2019  Glucose 70 - 99 mg/dL 98 107(H) 113(H)  BUN 8 - 23 mg/dL '18 17 19  ' Creatinine 0.44 - 1.00 mg/dL 0.71 0.69 0.76    Sodium 135 - 145 mmol/L 140 141 138  Potassium 3.5 - 5.1 mmol/L 4.3 4.3 4.6  Chloride 98 - 111 mmol/L 105 103 101  CO2 22 - 32 mmol/L '28 28 28  ' Calcium 8.9 - 10.3 mg/dL 10.1 10.3 10.0  Total Protein 6.5 - 8.1 g/dL 6.6 7.0 7.2  Total Bilirubin 0.3 - 1.2 mg/dL 0.4 0.7 0.5  Alkaline Phos 38 - 126 U/L 55 59 58  AST 15 - 41 U/L '17 20 29  ' ALT 0 - 44 U/L '17 18 31    ' All questions were answered to patient's stated satisfaction. Encouraged patient to call with any new concerns or questions before his next visit to the cancer center and we can certain see him sooner, if needed.     ASSESSMENT & PLAN:  Ductal carcinoma in situ (DCIS) of right breast 1.  Intermediate grade right breast DCIS: -Screening mammogram on 01/31/2019 was B RADS category 0.  Additional views of the right breast showed intermediate 0.4 cm mass within the upper outer right breast which is new. -Right breast biopsy at 10 o'clock position on 02/15/2019 consistent with DCIS, intermediate grade, ER/PR positive. -Right partial mastectomy on 03/09/2019 showing 0.6 cm involving an intraductal papilloma, intermediate grade, in situ carcinoma less than 1 mm from inferior margin, not on ink.  Other margins negative.  Right inferior margin excision shows low-grade DCIS, 0.6 cm.  Not completely clear if it is a 2 foci or DCIS. -She had an appointment with radiation oncology in [place] on 04/28/2019. -She started anastrozole in January 2021.  We will continue this for a minimum of 5 years. -Labs done on 12/27/2019 were all WNL.  She is due for her next mammogram in November 2021. -She will follow-up in 4 months with repeat labs.  We will also set her up with a survivorship appointment.  2.  Bone  density: -Patient had a bone density test on 04/15/2019 prior to starting anastrozole.  Her T score is -0.7 which is normal. -She is also taking vitamin D 2000 units daily.  She is also taking calcium.  3.  Family history: -2 sisters had breast  cancer. -Father had lymphoma. -Brother had prostate cancer -niece had ovarian cancer -paternal aunt had stomach cancer -she is scheduled for genetic counseling.     Orders placed this encounter:  Orders Placed This Encounter  Procedures  . MM DIAG BREAST TOMO UNI RIGHT  . Lactate dehydrogenase  . Vitamin B12  . VITAMIN D 25 Hydroxy (Vit-D Deficiency, Fractures)      Francene Finders, FNP-C [place] [phone]

## 2020-01-18 ENCOUNTER — Other Ambulatory Visit (HOSPITAL_COMMUNITY): Payer: Self-pay | Admitting: Hematology

## 2020-01-18 DIAGNOSIS — D0511 Intraductal carcinoma in situ of right breast: Secondary | ICD-10-CM

## 2020-01-18 DIAGNOSIS — Z9889 Other specified postprocedural states: Secondary | ICD-10-CM

## 2020-01-19 DIAGNOSIS — D0511 Intraductal carcinoma in situ of right breast: Secondary | ICD-10-CM | POA: Diagnosis not present

## 2020-02-14 ENCOUNTER — Ambulatory Visit (HOSPITAL_COMMUNITY)
Admission: RE | Admit: 2020-02-14 | Discharge: 2020-02-14 | Disposition: A | Discharge status: Home / Self Care | Payer: Medicare PPO | Source: Ambulatory Visit | Attending: Hematology | Admitting: Hematology

## 2020-02-14 ENCOUNTER — Ambulatory Visit (HOSPITAL_COMMUNITY)
Admission: RE | Admit: 2020-02-14 | Discharge: 2020-02-14 | Disposition: A | Discharge status: Home / Self Care | Payer: Medicare PPO | Source: Ambulatory Visit | Attending: Nurse Practitioner | Admitting: Nurse Practitioner

## 2020-02-14 ENCOUNTER — Other Ambulatory Visit: Payer: Self-pay

## 2020-02-14 DIAGNOSIS — Z9889 Other specified postprocedural states: Secondary | ICD-10-CM

## 2020-02-14 DIAGNOSIS — R928 Other abnormal and inconclusive findings on diagnostic imaging of breast: Secondary | ICD-10-CM | POA: Diagnosis not present

## 2020-02-14 DIAGNOSIS — D0511 Intraductal carcinoma in situ of right breast: Secondary | ICD-10-CM

## 2020-02-14 DIAGNOSIS — Z853 Personal history of malignant neoplasm of breast: Secondary | ICD-10-CM | POA: Diagnosis not present

## 2020-02-22 DIAGNOSIS — Z6841 Body Mass Index (BMI) 40.0 and over, adult: Secondary | ICD-10-CM | POA: Diagnosis not present

## 2020-02-22 DIAGNOSIS — Z23 Encounter for immunization: Secondary | ICD-10-CM | POA: Diagnosis not present

## 2020-02-22 DIAGNOSIS — M65849 Other synovitis and tenosynovitis, unspecified hand: Secondary | ICD-10-CM | POA: Diagnosis not present

## 2020-03-01 ENCOUNTER — Other Ambulatory Visit (HOSPITAL_COMMUNITY): Payer: Self-pay | Admitting: Hematology

## 2020-06-13 DIAGNOSIS — Z923 Personal history of irradiation: Secondary | ICD-10-CM | POA: Diagnosis not present

## 2020-06-13 DIAGNOSIS — D0511 Intraductal carcinoma in situ of right breast: Secondary | ICD-10-CM | POA: Diagnosis not present

## 2020-07-02 ENCOUNTER — Other Ambulatory Visit (HOSPITAL_COMMUNITY): Payer: Self-pay

## 2020-07-02 DIAGNOSIS — D0511 Intraductal carcinoma in situ of right breast: Secondary | ICD-10-CM

## 2020-07-03 ENCOUNTER — Inpatient Hospital Stay (HOSPITAL_COMMUNITY): Payer: Medicare PPO | Attending: Hematology

## 2020-07-03 ENCOUNTER — Other Ambulatory Visit: Payer: Self-pay

## 2020-07-03 DIAGNOSIS — Z79811 Long term (current) use of aromatase inhibitors: Secondary | ICD-10-CM | POA: Insufficient documentation

## 2020-07-03 DIAGNOSIS — D0511 Intraductal carcinoma in situ of right breast: Secondary | ICD-10-CM | POA: Insufficient documentation

## 2020-07-03 DIAGNOSIS — Z803 Family history of malignant neoplasm of breast: Secondary | ICD-10-CM | POA: Diagnosis not present

## 2020-07-03 LAB — COMPREHENSIVE METABOLIC PANEL
ALT: 18 U/L (ref 0–44)
AST: 22 U/L (ref 15–41)
Albumin: 3.8 g/dL (ref 3.5–5.0)
Alkaline Phosphatase: 62 U/L (ref 38–126)
Anion gap: 10 (ref 5–15)
BUN: 14 mg/dL (ref 8–23)
CO2: 27 mmol/L (ref 22–32)
Calcium: 10.1 mg/dL (ref 8.9–10.3)
Chloride: 103 mmol/L (ref 98–111)
Creatinine, Ser: 0.63 mg/dL (ref 0.44–1.00)
GFR, Estimated: >60 mL/min (ref >60)
Glucose, Bld: 99 mg/dL (ref 70–99)
Potassium: 4.2 mmol/L (ref 3.5–5.1)
Sodium: 140 mmol/L (ref 135–145)
Total Bilirubin: 0.5 mg/dL (ref 0.3–1.2)
Total Protein: 6.7 g/dL (ref 6.5–8.1)

## 2020-07-03 LAB — CBC WITH DIFFERENTIAL/PLATELET
Abs Immature Granulocytes: 0.02 10*3/uL (ref 0.00–0.07)
Basophils Absolute: 0 10*3/uL (ref 0.0–0.1)
Basophils Relative: 1 %
Eosinophils Absolute: 0.2 10*3/uL (ref 0.0–0.5)
Eosinophils Relative: 4 %
HCT: 41.3 % (ref 36.0–46.0)
Hemoglobin: 13 g/dL (ref 12.0–15.0)
Immature Granulocytes: 1 %
Lymphocytes Relative: 27 %
Lymphs Abs: 1.2 10*3/uL (ref 0.7–4.0)
MCH: 27.8 pg (ref 26.0–34.0)
MCHC: 31.5 g/dL (ref 30.0–36.0)
MCV: 88.4 fL (ref 80.0–100.0)
Monocytes Absolute: 0.6 10*3/uL (ref 0.1–1.0)
Monocytes Relative: 13 %
Neutro Abs: 2.3 10*3/uL (ref 1.7–7.7)
Neutrophils Relative %: 54 %
Platelets: 177 10*3/uL (ref 150–400)
RBC: 4.67 MIL/uL (ref 3.87–5.11)
RDW: 12.8 % (ref 11.5–15.5)
WBC: 4.3 10*3/uL (ref 4.0–10.5)
nRBC: 0 % (ref 0.0–0.2)

## 2020-07-03 LAB — LACTATE DEHYDROGENASE: LDH: 153 U/L (ref 98–192)

## 2020-07-03 LAB — VITAMIN B12: Vitamin B-12: 576 pg/mL (ref 180–914)

## 2020-07-03 LAB — VITAMIN D 25 HYDROXY (VIT D DEFICIENCY, FRACTURES): Vit D, 25-Hydroxy: 45.02 ng/mL (ref 30–100)

## 2020-07-10 ENCOUNTER — Other Ambulatory Visit: Payer: Self-pay

## 2020-07-10 ENCOUNTER — Encounter (HOSPITAL_COMMUNITY): Payer: Self-pay | Admitting: Hematology

## 2020-07-10 ENCOUNTER — Other Ambulatory Visit (HOSPITAL_COMMUNITY): Payer: Self-pay | Admitting: Hematology

## 2020-07-10 ENCOUNTER — Inpatient Hospital Stay (HOSPITAL_BASED_OUTPATIENT_CLINIC_OR_DEPARTMENT_OTHER): Payer: Medicare PPO | Admitting: Hematology

## 2020-07-10 VITALS — BP 146/91 | HR 77 | Temp 97.3°F | Resp 20 | Wt 221.4 lb

## 2020-07-10 DIAGNOSIS — D0511 Intraductal carcinoma in situ of right breast: Secondary | ICD-10-CM

## 2020-07-10 DIAGNOSIS — Z79811 Long term (current) use of aromatase inhibitors: Secondary | ICD-10-CM | POA: Diagnosis not present

## 2020-07-10 DIAGNOSIS — Z803 Family history of malignant neoplasm of breast: Secondary | ICD-10-CM | POA: Diagnosis not present

## 2020-07-10 MED ORDER — ALPRAZOLAM 0.25 MG PO TABS: 0.2500 mg | ORAL_TABLET | Freq: Every evening | ORAL | 0 refills | Status: DC | PRN

## 2020-07-10 NOTE — Progress Notes (Signed)
Patient taking Anastrozole as prescribed.  Describes intermittent hot flashes and bilateral breast soreness.

## 2020-07-10 NOTE — Patient Instructions (Signed)
[place] at [facility] Discharge Instructions  You were seen today by Dr. Delton Coombes. He went over your recent results. You will be scheduled to have a mammogram after February 13, 2021. You will be prescribed Xanax 0.25 mg to take 1/2 tablet at bedtime only as needed for sleep. Dr. Delton Coombes will see you back after the mammogram for labs and follow up.   Thank you for choosing [place] at [facility] to provide your oncology and hematology care.  To afford each patient quality time with our provider, please arrive at least 15 minutes before your scheduled appointment time.   If you have a lab appointment with the [facility] please come in thru the Main Entrance and check in at the main information desk  You need to re-schedule your appointment should you arrive 10 or more minutes late.  We strive to give you quality time with our providers, and arriving late affects you and other patients whose appointments are after yours.  Also, if you no show three or more times for appointments you may be dismissed from the clinic at the providers discretion.     Again, thank you for choosing [facility].  Our hope is that these requests will decrease the amount of time that you wait before being seen by our physicians.       _____________________________________________________________  Should you have questions after your visit to [facility], please contact our office at [phone] between the hours of 8:00 a.m. and 4:30 p.m.  Voicemails left after 4:00 p.m. will not be returned until the following business day.  For prescription refill requests, have your pharmacy contact our office and allow 72 hours.    Cancer Center Support Programs:   > Cancer Support Group  2nd Tuesday of the month 1pm-2pm, Journey Room

## 2020-07-10 NOTE — Progress Notes (Addendum)
[place] [address], [place] [address]   Patient Care Team: Sharilyn Sites, MD as PCP - General (Family Medicine) Danie Binder, MD (Inactive) (Gastroenterology) Donetta Potts, RN as Oncology Nurse Navigator Derek Jack, MD as Medical Oncologist (Oncology)  SUMMARY OF ONCOLOGIC HISTORY: Oncology History  Ductal carcinoma in situ (DCIS) of right breast  03/09/2019 Initial Diagnosis   Ductal carcinoma in situ (DCIS) of right breast   04/05/2019 Cancer Staging   Staging form: Breast, AJCC 8th Edition - Clinical stage from 04/05/2019: Stage 0 (cTis (DCIS), cN0, cM0, ER+, PR+, HER2: Not Assessed) - Signed by Derek Jack, MD on 04/05/2019     CHIEF COMPLIANT: Follow-up for right breast cancer   INTERVAL HISTORY: [name] is a 70 y.o. female here today for follow up of her right breast cancer. Her last visit was by Chelsey Finders, NP, on 12/30/2019.   Today she reports feeling okay. She complains of having bilateral axillary soreness which she attributes to carrying heavy wood in the winter. She continues taking Arimidex daily and she reports having occasional hot flashes. She is also taking vitamin D daily. She complains of having trouble staying asleep.   REVIEW OF SYSTEMS:   Review of Systems  Constitutional: Negative for appetite change and fatigue.  Musculoskeletal: Positive for myalgias (4/10 soreness in bilat axillae).  Psychiatric/Behavioral: Positive for sleep disturbance (difficulty falling asleep).  All other systems reviewed and are negative.   I have reviewed the past medical history, past surgical history, social history and family history with the patient and they are unchanged from previous note.   ALLERGIES:   is allergic to anesthetics, amide and tylox [oxycodone-acetaminophen].   MEDICATIONS:  Current Outpatient Medications  Medication Sig Dispense Refill  . anastrozole (ARIMIDEX) 1 MG tablet Take 1  tablet by mouth once daily 30 tablet 6  . aspirin EC 81 MG tablet Take 81 mg by mouth daily.    . celecoxib (CELEBREX) 200 MG capsule Take 200 mg by mouth as needed.    . Cholecalciferol (VITAMIN D) 50 MCG (2000 UT) CAPS Take 2,000 Units by mouth daily.    . magnesium oxide (MAG-OX) 400 MG tablet Take 200 mg by mouth daily.    Marland Kitchen nystatin (MYCOSTATIN/NYSTOP) powder Apply 1 application topically 3 (three) times daily. 15 g 0  . OMEGA 3 1200 MG CAPS Take 1,200 mg by mouth daily.     . quinapril-hydrochlorothiazide (ACCURETIC) 20-12.5 MG tablet Take 1 tablet by mouth daily.    . traMADol (ULTRAM) 50 MG tablet Take 1 tablet (50 mg total) by mouth every 6 (six) hours as needed. 20 tablet 0  . vitamin B-12 (CYANOCOBALAMIN) 500 MCG tablet Take 500 mcg by mouth daily.     No current facility-administered medications for this visit.     PHYSICAL EXAMINATION: Performance status (ECOG): 1 - Symptomatic but completely ambulatory  Vitals:   07/10/20 1517  BP: (!) 146/91  Pulse: 77  Resp: 20  Temp: (!) 97.3 F (36.3 C)  SpO2: 100%   Wt Readings from Last 3 Encounters:  07/10/20 221 lb 6.4 oz (100.4 kg)  12/30/19 226 lb 9.6 oz (102.8 kg)  08/25/19 228 lb 6.4 oz (103.6 kg)   Physical Exam Vitals reviewed.  Constitutional:      Appearance: Normal appearance. She is obese.  Cardiovascular:     Rate and Rhythm: Normal rate and regular rhythm.     Pulses: Normal pulses.  Heart sounds: Normal heart sounds.  Pulmonary:     Effort: Pulmonary effort is normal.     Breath sounds: Normal breath sounds.  Chest:  Breasts:     Right: Skin change (lumpectomy scar well healed) and tenderness (at RUOQ over scar) present. No swelling, bleeding, inverted nipple, mass or nipple discharge.     Left: Tenderness (in LUOQ TTP) present. No swelling, bleeding, inverted nipple, mass, nipple discharge or skin change.    Neurological:     General: No focal deficit present.     Mental Status: She is alert  and oriented to person, place, and time.  Psychiatric:        Mood and Affect: Mood normal.        Behavior: Behavior normal.     Breast Exam Chaperone: Milinda Antis, MD     LABORATORY DATA:  I have reviewed the data as listed CMP Latest Ref Rng & Units 07/03/2020 12/27/2019 08/18/2019  Glucose 70 - 99 mg/dL 99 98 107(H)  BUN 8 - 23 mg/dL '14 18 17  ' Creatinine 0.44 - 1.00 mg/dL 0.63 0.71 0.69  Sodium 135 - 145 mmol/L 140 140 141  Potassium 3.5 - 5.1 mmol/L 4.2 4.3 4.3  Chloride 98 - 111 mmol/L 103 105 103  CO2 22 - 32 mmol/L '27 28 28  ' Calcium 8.9 - 10.3 mg/dL 10.1 10.1 10.3  Total Protein 6.5 - 8.1 g/dL 6.7 6.6 7.0  Total Bilirubin 0.3 - 1.2 mg/dL 0.5 0.4 0.7  Alkaline Phos 38 - 126 U/L 62 55 59  AST 15 - 41 U/L '22 17 20  ' ALT 0 - 44 U/L '18 17 18   ' No results found for: [id number] Lab Results  Component Value Date   WBC 4.3 07/03/2020   HGB 13.0 07/03/2020   HCT 41.3 07/03/2020   MCV 88.4 07/03/2020   PLT 177 07/03/2020   NEUTROABS 2.3 07/03/2020   Lab Results  Component Value Date   LDH 153 07/03/2020   LDH 141 12/27/2019   Lab Results  Component Value Date   VD25OH 45.02 07/03/2020   VD25OH 42.82 12/27/2019   VD25OH 41.39 04/05/2019    ASSESSMENT:  1.  Intermediate grade right breast DCIS: -Screening mammogram on 01/31/2019 was B RADS category 0.  Additional views of the right breast showed intermediate 0.4 cm mass within the upper outer right breast which is new. -Right breast biopsy at 10 o'clock position on 02/15/2019 consistent with DCIS, intermediate grade, ER/PR positive. -Right partial mastectomy on 03/09/2019 showing 0.6 cm involving an intraductal papilloma, intermediate grade, in situ carcinoma less than 1 mm from inferior margin, not on ink.  Other margins negative.  Right inferior margin excision shows low-grade DCIS, 0.6 cm.  Not completely clear if it is a 2 foci or DCIS. -XRT completed in [place] in February 2021.  2.  Bone density: -Bone density on  04/15/2019 with T score of -0.7.  3.  Family history: -2 sisters had breast cancer. -Father had lymphoma. -Brother had prostate cancer -niece had ovarian cancer -paternal aunt had stomach cancer   PLAN:  1.  Intermediate grade right breast DCIS: -Physical examination today did not reveal any palpable masses.  Soreness over the right upper outer quadrant lumpectomy scar and right breast in general.  No palpable adenopathy.  No mass palpable in the left breast. -Reviewed labs from 07/03/2020 which showed normal LFTs, renal function and CBC. -Continue anastrozole. -RTC 6 months for follow-up. -Plan to do mammogram in November prior  to next visit.  2.  Bone density: -Reviewed vitamin D levels which were normal.  Prior DEXA scan was normal. -Continue vitamin D 2000 units daily.  3.  Anxiety: -She reports anxiety episodes occasionally at nighttime which prevent her from sleeping. -We will give her prescription for Xanax 0.25 mg at bedtime as needed.  She will start taking half tablet as needed.  Breast Cancer therapy associated bone loss: I have recommended calcium, Vitamin D and weight bearing exercises.   Orders Placed This Encounter  Procedures  . MM DIAG BREAST TOMO BILATERAL    Standing Status:   Future    Standing Expiration Date:   07/10/2021    Order Specific Question:   Reason for Exam (SYMPTOM  OR DIAGNOSIS REQUIRED)    Answer:   Ductal carcinoma in situ (DCIS) of right breast    Order Specific Question:   Preferred imaging location?    Answer:   [facility]    Order Specific Question:   Release to patient    Answer:   Immediate  . CBC with Differential/Platelet    Standing Status:   Future    Standing Expiration Date:   07/10/2021    Order Specific Question:   Release to patient    Answer:   Immediate  . Comprehensive metabolic panel    Standing Status:   Future    Standing Expiration Date:   07/10/2021    Order Specific Question:   Release to patient     Answer:   Immediate  . VITAMIN D 25 Hydroxy (Vit-D Deficiency, Fractures)    Standing Status:   Future    Standing Expiration Date:   07/10/2021    Order Specific Question:   Release to patient    Answer:   Immediate   The patient has a good understanding of the overall plan. she agrees with it. she will call with any problems that may develop before the next visit here.    Derek Jack, MD [place] [phone]   I, Milinda Antis, am acting as a scribe for Dr. Sanda Linger.  I, Derek Jack MD, have reviewed the above documentation for accuracy and completeness, and I agree with the above.

## 2020-08-01 ENCOUNTER — Other Ambulatory Visit (HOSPITAL_COMMUNITY): Payer: Self-pay

## 2020-08-01 DIAGNOSIS — D0511 Intraductal carcinoma in situ of right breast: Secondary | ICD-10-CM

## 2020-08-01 MED ORDER — ANASTROZOLE 1 MG PO TABS: 1.0000 mg | ORAL_TABLET | Freq: Every day | ORAL | 3 refills | Status: DC

## 2020-09-05 DIAGNOSIS — Z6841 Body Mass Index (BMI) 40.0 and over, adult: Secondary | ICD-10-CM | POA: Diagnosis not present

## 2020-09-05 DIAGNOSIS — Z1331 Encounter for screening for depression: Secondary | ICD-10-CM | POA: Diagnosis not present

## 2020-09-05 DIAGNOSIS — S43401A Unspecified sprain of right shoulder joint, initial encounter: Secondary | ICD-10-CM | POA: Diagnosis not present

## 2020-10-25 ENCOUNTER — Emergency Department (HOSPITAL_COMMUNITY)
Admission: EM | Admit: 2020-10-25 | Discharge: 2020-10-25 | Disposition: A | Discharge status: Home / Self Care | Payer: Medicare PPO | Attending: Emergency Medicine | Admitting: Emergency Medicine

## 2020-10-25 ENCOUNTER — Other Ambulatory Visit: Payer: Self-pay

## 2020-10-25 ENCOUNTER — Encounter (HOSPITAL_COMMUNITY): Payer: Self-pay | Admitting: Emergency Medicine

## 2020-10-25 DIAGNOSIS — W01198A Fall on same level from slipping, tripping and stumbling with subsequent striking against other object, initial encounter: Secondary | ICD-10-CM | POA: Diagnosis not present

## 2020-10-25 DIAGNOSIS — S0990XA Unspecified injury of head, initial encounter: Secondary | ICD-10-CM | POA: Diagnosis not present

## 2020-10-25 DIAGNOSIS — Z87891 Personal history of nicotine dependence: Secondary | ICD-10-CM | POA: Insufficient documentation

## 2020-10-25 DIAGNOSIS — Z7982 Long term (current) use of aspirin: Secondary | ICD-10-CM | POA: Diagnosis not present

## 2020-10-25 DIAGNOSIS — Y92009 Unspecified place in unspecified non-institutional (private) residence as the place of occurrence of the external cause: Secondary | ICD-10-CM | POA: Insufficient documentation

## 2020-10-25 DIAGNOSIS — Y9301 Activity, walking, marching and hiking: Secondary | ICD-10-CM | POA: Insufficient documentation

## 2020-10-25 DIAGNOSIS — I10 Essential (primary) hypertension: Secondary | ICD-10-CM | POA: Diagnosis not present

## 2020-10-25 DIAGNOSIS — Z79899 Other long term (current) drug therapy: Secondary | ICD-10-CM | POA: Diagnosis not present

## 2020-10-25 DIAGNOSIS — Z853 Personal history of malignant neoplasm of breast: Secondary | ICD-10-CM | POA: Diagnosis not present

## 2020-10-25 DIAGNOSIS — W19XXXA Unspecified fall, initial encounter: Secondary | ICD-10-CM

## 2020-10-25 NOTE — ED Provider Notes (Signed)
[facility] EMERGENCY DEPARTMENT Provider Note   CSN: [id number] Arrival date & time: 10/25/20  0540     History Chief Complaint  Patient presents with  . Fall    [facility] is a 70 y.o. female.  Patient is a 70 year old female with past medical history of hypertension, hysterectomy.  Patient presenting today for evaluation of fall.  She states she was moving through the dark house, struck her head on a wall, then fell backward on the floor.  She denies any loss of consciousness.  She denies to me she is experiencing any headache, numbness, or visual disturbances.  She is concerned because she hit the wall hard enough to knock her to the ground.  She denies any pain at present.  The history is provided by the patient.      Past Medical History:  Diagnosis Date  . Allergy   . Anesthesia complication    tachycardia  . Breast cancer ([place])   . Complication of anesthesia    blood pressure drops quickly with anesthetic  . HTN (hypertension)   . Obesity     Patient Active Problem List   Diagnosis Date Noted  . Ductal carcinoma in situ (DCIS) of right breast   . Rectal bleeding 09/11/2011    Past Surgical History:  Procedure Laterality Date  . ABDOMINAL HYSTERECTOMY  1986   complete, uterine fibroids  . BREAST BIOPSY Right    negative  . COLONOSCOPY  09/19/2011   Procedure: COLONOSCOPY;  Surgeon: Danie Binder, MD;  Location: [facility] ENDO SUITE;  Service: Endoscopy;  Laterality: N/A;  12:30  . PARTIAL MASTECTOMY WITH NEEDLE LOCALIZATION Right 03/09/2019   Procedure: PARTIAL MASTECTOMY WITH NEEDLE LOCALIZATION;  Surgeon: Aviva Signs, MD;  Location: [facility] ORS;  Service: General;  Laterality: Right;     OB History   No obstetric history on file.     Family History  Problem Relation Age of Onset  . Ulcers Brother   . Colon polyps Mother 73  . Congestive Heart Failure Mother   . Lymphoma Father   . Hypertension Sister   . Breast cancer Sister   . Hypertension  Sister   . Breast cancer Sister   . Hypertension Sister   . Hypertension Sister     Social History   Tobacco Use  . Smoking status: Former    Packs/day: 1.00    Years: 10.00    Pack years: 10.00    Types: Cigarettes    Quit date: 09/11/1991    Years since quitting: 29.1  . Smokeless tobacco: Never  . Tobacco comments:    quit about 20 + years  Vaping Use  . Vaping Use: Never used  Substance Use Topics  . Alcohol use: No  . Drug use: No    Home Medications Prior to Admission medications   Medication Sig Start Date End Date Taking? Authorizing Provider  ALPRAZolam (XANAX) 0.25 MG tablet Take 1 tablet (0.25 mg total) by mouth at bedtime as needed for anxiety. 07/10/20   Derek Jack, MD  anastrozole (ARIMIDEX) 1 MG tablet Take 1 tablet (1 mg total) by mouth daily. 08/01/20   Derek Jack, MD  aspirin EC 81 MG tablet Take 81 mg by mouth daily.    [provider]  celecoxib (CELEBREX) 200 MG capsule Take 200 mg by mouth as needed.    [provider]  Cholecalciferol (VITAMIN D) 50 MCG (2000 UT) CAPS Take 2,000 Units by mouth daily.    [provider]  magnesium oxide (MAG-OX) 400 MG tablet Take 200 mg by mouth daily.    [provider]  nystatin (MYCOSTATIN/NYSTOP) powder Apply 1 application topically 3 (three) times daily. 12/30/19   Lockamy, Randi L, NP-C  OMEGA 3 1200 MG CAPS Take 1,200 mg by mouth daily.     [provider]  quinapril-hydrochlorothiazide (ACCURETIC) 20-12.5 MG tablet Take 1 tablet by mouth daily.    [provider]  traMADol (ULTRAM) 50 MG tablet Take 1 tablet (50 mg total) by mouth every 6 (six) hours as needed. 03/09/19   Aviva Signs, MD  vitamin B-12 (CYANOCOBALAMIN) 500 MCG tablet Take 500 mcg by mouth daily.    [provider]    Allergies    Anesthetics, amide and Tylox [oxycodone-acetaminophen]  Review of Systems   Review of Systems  All other systems reviewed and are  negative.  Physical Exam Updated Vital Signs BP (!) 148/81 (BP Location: Left Arm)   Pulse 93   Temp 98.2 F (36.8 C) (Oral)   Resp 18   Ht 5' (1.524 m)   Wt 99.8 kg   SpO2 100%   BMI 42.97 kg/m   Physical Exam Vitals and nursing note reviewed.  Constitutional:      General: She is not in acute distress.    Appearance: She is well-developed. She is not diaphoretic.  HENT:     Head: Normocephalic and atraumatic.  Eyes:     Extraocular Movements: Extraocular movements intact.     Pupils: Pupils are equal, round, and reactive to light.  Cardiovascular:     Rate and Rhythm: Normal rate and regular rhythm.     Heart sounds: No murmur heard.   No friction rub. No gallop.  Pulmonary:     Effort: Pulmonary effort is normal. No respiratory distress.     Breath sounds: Normal breath sounds. No wheezing.  Abdominal:     General: Bowel sounds are normal. There is no distension.     Palpations: Abdomen is soft.     Tenderness: There is no abdominal tenderness.  Musculoskeletal:        General: Normal range of motion.     Cervical back: Normal range of motion and neck supple.  Skin:    General: Skin is warm and dry.  Neurological:     General: No focal deficit present.     Mental Status: She is alert and oriented to person, place, and time.     Cranial Nerves: No cranial nerve deficit.     Sensory: No sensory deficit.     Motor: No weakness.     Coordination: Coordination normal.    ED Results / Procedures / Treatments   Labs (all labs ordered are listed, but only abnormal results are displayed) Labs Reviewed - No data to display  EKG None  Radiology No results found.  Procedures Procedures   Medications Ordered in ED Medications - No data to display  ED Course  I have reviewed the triage vital signs and the nursing notes.  Pertinent labs & imaging results that were available during my care of the patient were reviewed by me and considered in my medical  decision making (see chart for details).    MDM Rules/Calculators/A&P  Patient presenting after a head injury/fall as described in the HPI.  She denies any symptoms at present.  She is not having any headache, any neck pain, any facial pain.  There are no overt signs of trauma.  I  see no swelling, abrasions, or lacerations.  She is neurologically intact and I feel as though discharge is appropriate.  She is not having any symptoms that I feel require imaging.  Patient comfortable with discharge and taking a watch and wait approach.  If she worsens, she will return.  Final Clinical Impression(s) / ED Diagnoses Final diagnoses:  None    Rx / DC Orders ED Discharge Orders     None        Veryl Speak, MD 10/25/20 [phone]

## 2020-10-25 NOTE — Discharge Instructions (Addendum)
Return to the emergency department if you develop severe headache, nausea/vomiting, weakness or numbness, or other new and concerning symptoms.

## 2020-10-25 NOTE — ED Triage Notes (Addendum)
Pt states she thinks she was "walking too fast through the house and fell hitting her forehead on R side against a wall". Pt denies LOC

## 2020-11-23 DIAGNOSIS — E559 Vitamin D deficiency, unspecified: Secondary | ICD-10-CM | POA: Diagnosis not present

## 2020-11-23 DIAGNOSIS — I10 Essential (primary) hypertension: Secondary | ICD-10-CM | POA: Diagnosis not present

## 2020-11-23 DIAGNOSIS — E538 Deficiency of other specified B group vitamins: Secondary | ICD-10-CM | POA: Diagnosis not present

## 2020-11-23 DIAGNOSIS — D1724 Benign lipomatous neoplasm of skin and subcutaneous tissue of left leg: Secondary | ICD-10-CM | POA: Diagnosis not present

## 2020-11-23 DIAGNOSIS — M1991 Primary osteoarthritis, unspecified site: Secondary | ICD-10-CM | POA: Diagnosis not present

## 2020-11-23 DIAGNOSIS — Z0001 Encounter for general adult medical examination with abnormal findings: Secondary | ICD-10-CM | POA: Diagnosis not present

## 2020-11-23 DIAGNOSIS — Z6841 Body Mass Index (BMI) 40.0 and over, adult: Secondary | ICD-10-CM | POA: Diagnosis not present

## 2020-11-23 DIAGNOSIS — T464X5A Adverse effect of angiotensin-converting-enzyme inhibitors, initial encounter: Secondary | ICD-10-CM | POA: Diagnosis not present

## 2020-11-23 DIAGNOSIS — Z1389 Encounter for screening for other disorder: Secondary | ICD-10-CM | POA: Diagnosis not present

## 2020-11-23 DIAGNOSIS — E782 Mixed hyperlipidemia: Secondary | ICD-10-CM | POA: Diagnosis not present

## 2020-11-23 DIAGNOSIS — R7309 Other abnormal glucose: Secondary | ICD-10-CM | POA: Diagnosis not present

## 2020-12-27 DIAGNOSIS — D0511 Intraductal carcinoma in situ of right breast: Secondary | ICD-10-CM | POA: Diagnosis not present

## 2020-12-27 DIAGNOSIS — Z923 Personal history of irradiation: Secondary | ICD-10-CM | POA: Diagnosis not present

## 2021-02-19 ENCOUNTER — Ambulatory Visit (HOSPITAL_COMMUNITY)
Admission: RE | Admit: 2021-02-19 | Discharge: 2021-02-19 | Disposition: A | Discharge status: Home / Self Care | Payer: Medicare PPO | Source: Ambulatory Visit | Attending: Hematology | Admitting: Hematology

## 2021-02-19 ENCOUNTER — Inpatient Hospital Stay (HOSPITAL_COMMUNITY): Payer: Medicare PPO | Attending: Hematology

## 2021-02-19 ENCOUNTER — Other Ambulatory Visit: Payer: Self-pay

## 2021-02-19 DIAGNOSIS — D0511 Intraductal carcinoma in situ of right breast: Secondary | ICD-10-CM | POA: Insufficient documentation

## 2021-02-19 DIAGNOSIS — Z79811 Long term (current) use of aromatase inhibitors: Secondary | ICD-10-CM | POA: Diagnosis not present

## 2021-02-19 DIAGNOSIS — Z923 Personal history of irradiation: Secondary | ICD-10-CM | POA: Insufficient documentation

## 2021-02-19 DIAGNOSIS — R922 Inconclusive mammogram: Secondary | ICD-10-CM | POA: Diagnosis not present

## 2021-02-19 LAB — CBC WITH DIFFERENTIAL/PLATELET
Abs Immature Granulocytes: 0.01 10*3/uL (ref 0.00–0.07)
Basophils Absolute: 0 10*3/uL (ref 0.0–0.1)
Basophils Relative: 0 %
Eosinophils Absolute: 0.2 10*3/uL (ref 0.0–0.5)
Eosinophils Relative: 4 %
HCT: 39.8 % (ref 36.0–46.0)
Hemoglobin: 12.8 g/dL (ref 12.0–15.0)
Immature Granulocytes: 0 %
Lymphocytes Relative: 24 %
Lymphs Abs: 1 10*3/uL (ref 0.7–4.0)
MCH: 28.1 pg (ref 26.0–34.0)
MCHC: 32.2 g/dL (ref 30.0–36.0)
MCV: 87.5 fL (ref 80.0–100.0)
Monocytes Absolute: 0.6 10*3/uL (ref 0.1–1.0)
Monocytes Relative: 13 %
Neutro Abs: 2.5 10*3/uL (ref 1.7–7.7)
Neutrophils Relative %: 59 %
Platelets: 172 10*3/uL (ref 150–400)
RBC: 4.55 MIL/uL (ref 3.87–5.11)
RDW: 13.2 % (ref 11.5–15.5)
WBC: 4.3 10*3/uL (ref 4.0–10.5)
nRBC: 0 % (ref 0.0–0.2)

## 2021-02-19 LAB — COMPREHENSIVE METABOLIC PANEL
ALT: 19 U/L (ref 0–44)
AST: 20 U/L (ref 15–41)
Albumin: 3.7 g/dL (ref 3.5–5.0)
Alkaline Phosphatase: 73 U/L (ref 38–126)
Anion gap: 6 (ref 5–15)
BUN: 12 mg/dL (ref 8–23)
CO2: 29 mmol/L (ref 22–32)
Calcium: 10 mg/dL (ref 8.9–10.3)
Chloride: 104 mmol/L (ref 98–111)
Creatinine, Ser: 0.66 mg/dL (ref 0.44–1.00)
GFR, Estimated: >60 mL/min (ref >60)
Glucose, Bld: 106 mg/dL — ABNORMAL HIGH (ref 70–99)
Potassium: 4.4 mmol/L (ref 3.5–5.1)
Sodium: 139 mmol/L (ref 135–145)
Total Bilirubin: 0.8 mg/dL (ref 0.3–1.2)
Total Protein: 6.7 g/dL (ref 6.5–8.1)

## 2021-02-19 LAB — VITAMIN D 25 HYDROXY (VIT D DEFICIENCY, FRACTURES): Vit D, 25-Hydroxy: 42.7 ng/mL (ref 30–100)

## 2021-02-25 NOTE — Progress Notes (Signed)
[place] [address], [place] [address]   Patient Care Team: Sharilyn Sites, MD as PCP - General (Family Medicine) Danie Binder, MD (Inactive) (Gastroenterology) Donetta Potts, RN as Oncology Nurse Navigator Derek Jack, MD as Medical Oncologist (Oncology)  SUMMARY OF ONCOLOGIC HISTORY: Oncology History  Ductal carcinoma in situ (DCIS) of right breast  03/09/2019 Initial Diagnosis   Ductal carcinoma in situ (DCIS) of right breast   04/05/2019 Cancer Staging   Staging form: Breast, AJCC 8th Edition - Clinical stage from 04/05/2019: Stage 0 (cTis (DCIS), cN0, cM0, ER+, PR+, HER2: Not Assessed) - Signed by Derek Jack, MD on 04/05/2019      CHIEF COMPLIANT: Follow-up for right breast cancer   INTERVAL HISTORY: [name] is a 70 y.o. female here today for follow up of her right breast cancer. Her last visit was on 07/10/2020.   Today she reports feeling good. She is taking anastrozole and tolerating it well. Her hot flashes have improved. She reports occasional brief joint pains. Her sleep and anxiety has improved, and she is not taking Xanax.   REVIEW OF SYSTEMS:   Review of Systems  Constitutional:  Negative for appetite change and fatigue.  Respiratory:  Positive for cough.   Cardiovascular:  Positive for palpitations.  Endocrine: Positive for hot flashes (improved).  Genitourinary:  Positive for frequency.   Musculoskeletal:  Positive for arthralgias and neck pain (stiff).  Psychiatric/Behavioral:  Negative for sleep disturbance (improved). The patient is not nervous/anxious (improved).   All other systems reviewed and are negative.  I have reviewed the past medical history, past surgical history, social history and family history with the patient and they are unchanged from previous note.   ALLERGIES:   is allergic to anesthetics, amide and tylox [oxycodone-acetaminophen].   MEDICATIONS:  Current Outpatient  Medications  Medication Sig Dispense Refill   anastrozole (ARIMIDEX) 1 MG tablet Take 1 tablet (1 mg total) by mouth daily. 90 tablet 3   aspirin EC 81 MG tablet Take 81 mg by mouth daily.     Cholecalciferol (VITAMIN D) 50 MCG (2000 UT) CAPS Take 2,000 Units by mouth daily.     magnesium oxide (MAG-OX) 400 MG tablet Take 200 mg by mouth daily.     OMEGA 3 1200 MG CAPS Take 1,200 mg by mouth daily.      vitamin B-12 (CYANOCOBALAMIN) 500 MCG tablet Take 500 mcg by mouth daily.     celecoxib (CELEBREX) 200 MG capsule Take 200 mg by mouth as needed. (Patient not taking: Reported on 02/26/2021)     No current facility-administered medications for this visit.     PHYSICAL EXAMINATION: Performance status (ECOG): 1 - Symptomatic but completely ambulatory  Vitals:   02/26/21 1402  BP: (!) 148/88  Pulse: 94  Resp: 17  Temp: (!) 97 F (36.1 C)  SpO2: 100%   Wt Readings from Last 3 Encounters:  02/26/21 217 lb 3.2 oz (98.5 kg)  10/25/20 220 lb (99.8 kg)  07/10/20 221 lb 6.4 oz (100.4 kg)   Physical Exam Vitals reviewed.  Constitutional:      Appearance: Normal appearance. She is obese.  Cardiovascular:     Rate and Rhythm: Normal rate and regular rhythm.     Pulses: Normal pulses.     Heart sounds: Normal heart sounds.  Pulmonary:     Effort: Pulmonary effort is normal.     Breath sounds: Normal breath sounds.  Neurological:  General: No focal deficit present.     Mental Status: She is alert and oriented to person, place, and time.  Psychiatric:        Mood and Affect: Mood normal.        Behavior: Behavior normal.    Breast Exam Chaperone: Thana Ates     LABORATORY DATA:  I have reviewed the data as listed CMP Latest Ref Rng & Units 02/19/2021 07/03/2020 12/27/2019  Glucose 70 - 99 mg/dL 106(H) 99 98  BUN 8 - 23 mg/dL '12 14 18  ' Creatinine 0.44 - 1.00 mg/dL 0.66 0.63 0.71  Sodium 135 - 145 mmol/L 139 140 140  Potassium 3.5 - 5.1 mmol/L 4.4 4.2 4.3  Chloride 98 -  111 mmol/L 104 103 105  CO2 22 - 32 mmol/L '29 27 28  ' Calcium 8.9 - 10.3 mg/dL 10.0 10.1 10.1  Total Protein 6.5 - 8.1 g/dL 6.7 6.7 6.6  Total Bilirubin 0.3 - 1.2 mg/dL 0.8 0.5 0.4  Alkaline Phos 38 - 126 U/L 73 62 55  AST 15 - 41 U/L '20 22 17  ' ALT 0 - 44 U/L '19 18 17   ' No results found for: YBO175 Lab Results  Component Value Date   WBC 4.3 02/19/2021   HGB 12.8 02/19/2021   HCT 39.8 02/19/2021   MCV 87.5 02/19/2021   PLT 172 02/19/2021   NEUTROABS 2.5 02/19/2021    ASSESSMENT:  1.  Intermediate grade right breast DCIS: -Screening mammogram on 01/31/2019 was B RADS category 0.  Additional views of the right breast showed intermediate 0.4 cm mass within the upper outer right breast which is new. -Right breast biopsy at 10 o'clock position on 02/15/2019 consistent with DCIS, intermediate grade, ER/PR positive. -Right partial mastectomy on 03/09/2019 showing 0.6 cm involving an intraductal papilloma, intermediate grade, in situ carcinoma less than 1 mm from inferior margin, not on ink.  Other margins negative.  Right inferior margin excision shows low-grade DCIS, 0.6 cm.  Not completely clear if it is a 2 foci or DCIS. -XRT completed in [place] in February 2021.   2.  Bone density: -Bone density on 04/15/2019 with T score of -0.7.   3.  Family history: -2 sisters had breast cancer. -Father had lymphoma. -Brother had prostate cancer -niece had ovarian cancer -paternal aunt had stomach cancer   PLAN:  1.  Intermediate grade right breast DCIS: - She is tolerating anastrozole reasonably well.  Occasional minor hot flashes. - Reviewed mammogram from 02/19/2021, BI-RADS Category 2. - Continue yearly mammograms.  RTC 6 months for follow-up with repeat labs.   2.  Bone density: - Vitamin D level is 42. - Continue vitamin D 2000 units daily.   3.  Anxiety: - At last visit I have given prescription of Xanax which she never started. - She reports anxiety is well controlled at this  time.  Breast Cancer therapy associated bone loss: I have recommended calcium, Vitamin D and weight bearing exercises.  Orders placed this encounter:  No orders of the defined types were placed in this encounter.   The patient has a good understanding of the overall plan. She agrees with it. She will call with any problems that may develop before the next visit here.  Derek Jack, MD [place] [phone]   I, Thana Ates, am acting as a scribe for Dr. Derek Jack.  I, Derek Jack MD, have reviewed the above documentation for accuracy and completeness, and I agree with the above.

## 2021-02-26 ENCOUNTER — Inpatient Hospital Stay (HOSPITAL_COMMUNITY): Payer: Medicare PPO | Admitting: Hematology

## 2021-02-26 ENCOUNTER — Other Ambulatory Visit: Payer: Self-pay

## 2021-02-26 VITALS — BP 148/88 | HR 94 | Temp 97.0°F | Resp 17 | Wt 217.2 lb

## 2021-02-26 DIAGNOSIS — Z923 Personal history of irradiation: Secondary | ICD-10-CM | POA: Diagnosis not present

## 2021-02-26 DIAGNOSIS — D0511 Intraductal carcinoma in situ of right breast: Secondary | ICD-10-CM

## 2021-02-26 DIAGNOSIS — Z79811 Long term (current) use of aromatase inhibitors: Secondary | ICD-10-CM | POA: Diagnosis not present

## 2021-02-26 NOTE — Patient Instructions (Signed)
[place] at [facility] Discharge Instructions  You were seen and examined today by Dr. Delton Coombes. He reviewed your most recent labs and everything looks good. Continue taking the anastrozole and Vitamin D as prescribed. Please follow up as scheduled in 6 months.   Thank you for choosing [place] at [facility] to provide your oncology and hematology care.  To afford each patient quality time with our provider, please arrive at least 15 minutes before your scheduled appointment time.   If you have a lab appointment with the [facility] please come in thru the Main Entrance and check in at the main information desk.  You need to re-schedule your appointment should you arrive 10 or more minutes late.  We strive to give you quality time with our providers, and arriving late affects you and other patients whose appointments are after yours.  Also, if you no show three or more times for appointments you may be dismissed from the clinic at the providers discretion.     Again, thank you for choosing [facility].  Our hope is that these requests will decrease the amount of time that you wait before being seen by our physicians.       _____________________________________________________________  Should you have questions after your visit to [facility], please contact our office at [phone] and follow the prompts.  Our office hours are 8:00 a.m. and 4:30 p.m. Monday - Friday.  Please note that voicemails left after 4:00 p.m. may not be returned until the following business day.  We are closed weekends and major holidays.  You do have access to a nurse 24-7, just call the main number to the clinic [phone] and do not press any options, hold on the line and a nurse will answer the phone.    For prescription refill requests, have your pharmacy contact our office and allow 72 hours.    Due to Covid, you will need to wear a  mask upon entering the [facility]. If you do not have a mask, a mask will be given to you at the Main Entrance upon arrival. For doctor visits, patients may have 1 support person age 60 or older with them. For treatment visits, patients can not have anyone with them due to social distancing guidelines and our immunocompromised population.

## 2021-04-08 DIAGNOSIS — I10 Essential (primary) hypertension: Secondary | ICD-10-CM | POA: Diagnosis not present

## 2021-04-08 DIAGNOSIS — Z6839 Body mass index (BMI) 39.0-39.9, adult: Secondary | ICD-10-CM | POA: Diagnosis not present

## 2021-04-08 DIAGNOSIS — M1991 Primary osteoarthritis, unspecified site: Secondary | ICD-10-CM | POA: Diagnosis not present

## 2021-04-08 DIAGNOSIS — L03019 Cellulitis of unspecified finger: Secondary | ICD-10-CM | POA: Diagnosis not present

## 2021-08-05 ENCOUNTER — Encounter: Payer: Self-pay | Admitting: *Deleted

## 2021-08-13 ENCOUNTER — Ambulatory Visit (INDEPENDENT_AMBULATORY_CARE_PROVIDER_SITE_OTHER): Payer: Medicare PPO | Admitting: Obstetrics & Gynecology

## 2021-08-13 ENCOUNTER — Encounter: Payer: Self-pay | Admitting: Obstetrics & Gynecology

## 2021-08-13 VITALS — BP 159/92 | HR 91 | Ht 62.0 in | Wt 209.8 lb

## 2021-08-13 DIAGNOSIS — N939 Abnormal uterine and vaginal bleeding, unspecified: Secondary | ICD-10-CM

## 2021-08-13 DIAGNOSIS — N952 Postmenopausal atrophic vaginitis: Secondary | ICD-10-CM | POA: Diagnosis not present

## 2021-08-13 NOTE — Progress Notes (Signed)
? ?  WELL-WOMAN EXAMINATION ?Patient name: [facility] MRN [id number]  Date of birth: 1950/06/19 ?Chief Complaint:   ?Gynecologic Exam ? ?History of Present Illness:   ?C[facility] is a 71 y.o. G0P0000 PM, [place] female being seen today for a routine well-woman exam.  ? ?Vaginal spotting- she noted some blood when she cleaned, about 2 weeks ago.  She has not had any bleeding since then.  Seemed to be coming from the inside.  No discharge, no itching or odor.  She does report that this happened after she was douching/cleaning inside ? ?No LMP recorded. Patient has had a hysterectomy. ? ? ?Last mammogram: 01/2021. ?Last colonoscopy: 2013 ? ? ?  08/13/2021  ?  3:46 PM 07/10/2020  ?  3:45 PM 04/04/2019  ?  4:16 PM  ?Depression screen PHQ 2/9  ?Decreased Interest 0 0 0  ?Down, Depressed, Hopeless 0 0 0  ?PHQ - 2 Score 0 0 0  ?Altered sleeping 1    ?Tired, decreased energy 1    ?Change in appetite 0    ?Feeling bad or failure about yourself  0    ?Trouble concentrating 1    ?Moving slowly or fidgety/restless 0    ?Suicidal thoughts 0    ?PHQ-9 Score 3    ? ? ? ? ?Review of Systems:   ?Pertinent items are noted in HPI ?Denies any headaches, blurred vision, fatigue, shortness of breath, chest pain, abdominal pain, bowel movements, urination, or intercourse unless otherwise stated above. ? ?Pertinent History Reviewed:  ?Reviewed past medical,surgical, social and family history.  ?Reviewed problem list, medications and allergies. ?Physical Assessment:  ? ?Vitals:  ? 08/13/21 1539  ?BP: (!) 159/92  ?Pulse: 91  ?Weight: 209 lb 12.8 oz (95.2 kg)  ?Height: '5\' 2"'$  (1.575 m)  ?Body mass index is 38.37 kg/m?. ?  ?     Physical Examination:  ? General appearance - well appearing, and in no distress ? Mental status - alert, oriented to person, place, and time ? Psych:  She has a normal mood and affect ? Skin - warm and dry, normal color, no suspicious lesions noted ? Chest - effort normal, all lung fields clear to auscultation  bilaterally ? Heart - normal rate and regular rhythm ? Neck:  midline trachea, no thyromegaly or nodules ? Breasts - breasts appear normal, no suspicious masses, no skin or nipple changes or  axillary nodes ? Abdomen - obese, soft, nontender, nondistended, no masses or organomegaly ? Pelvic - VULVA: normal appearing vulva with no masses, tenderness or lesions  VAGINA: normal appearing vagina with normal color and discharge, no lesions,  uterus and cervix surgically absent.  No abnormalities noted on bimanual exam- limited due to body habitus ? Extremities:  No swelling or varicosities noted ? ?Chaperone:  Dr. Nita Sells    ? ? ?Assessment & Plan:  ?1) Vaginal bleeding ?-suspect spotting due to atrophy ?-reviewed with patient that cleaning inside is not needed, vagina with its own natural bacteria ?-no acute abnormalities noted ? ?2)Preventive screening ?-pap not indicated ?-other exams up to date ? ? ?Meds: No orders of the defined types were placed in this encounter. ? ? ?Follow-up: Return in about 1 year (around 08/14/2022) for Annual. ? ? ?Chelsey Pupa, DO ?Attending [place] ?[facility] [place] ? ? ?

## 2021-08-18 IMAGING — MG DIGITAL SCREENING BILAT W/ TOMO
6 of 12 series · 6 of 36 positions shown · non-contrast
Comparison: Previous exam(s).

CLINICAL DATA: Screening.

EXAM:
DIGITAL SCREENING BILATERAL MAMMOGRAM WITH TOMO AND CAD

[L CC synth-2D]
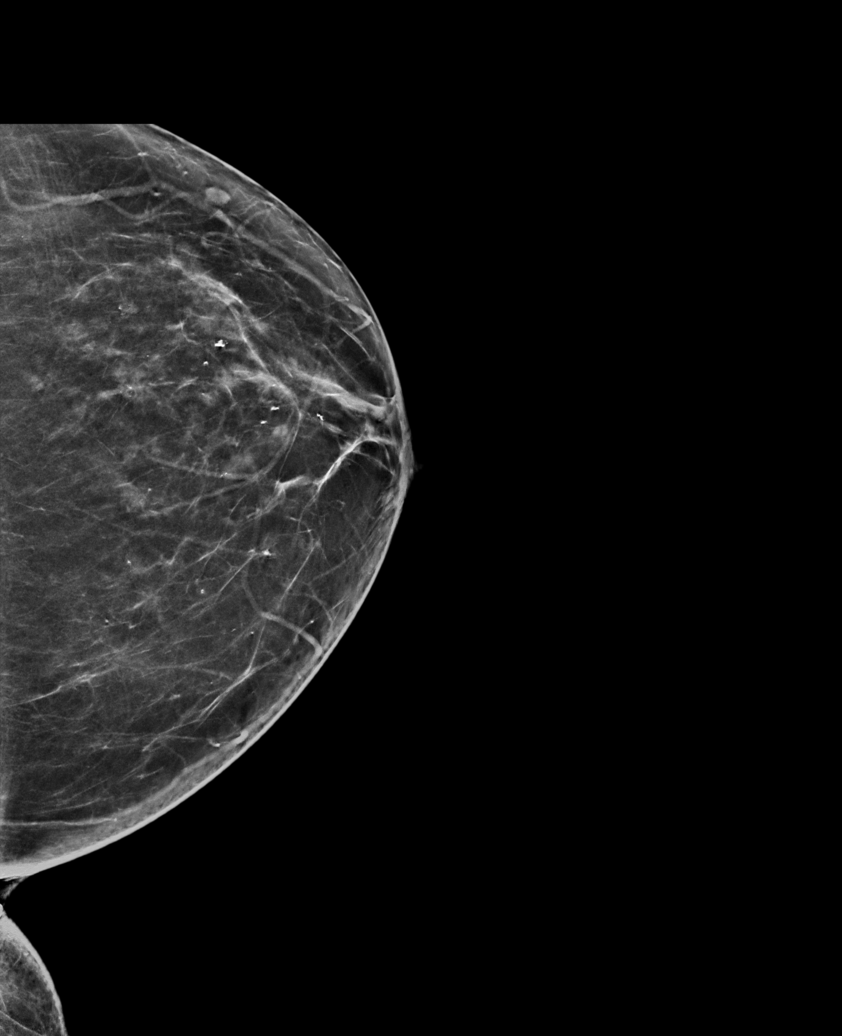

[R CC synth-2D (1 of 2)]
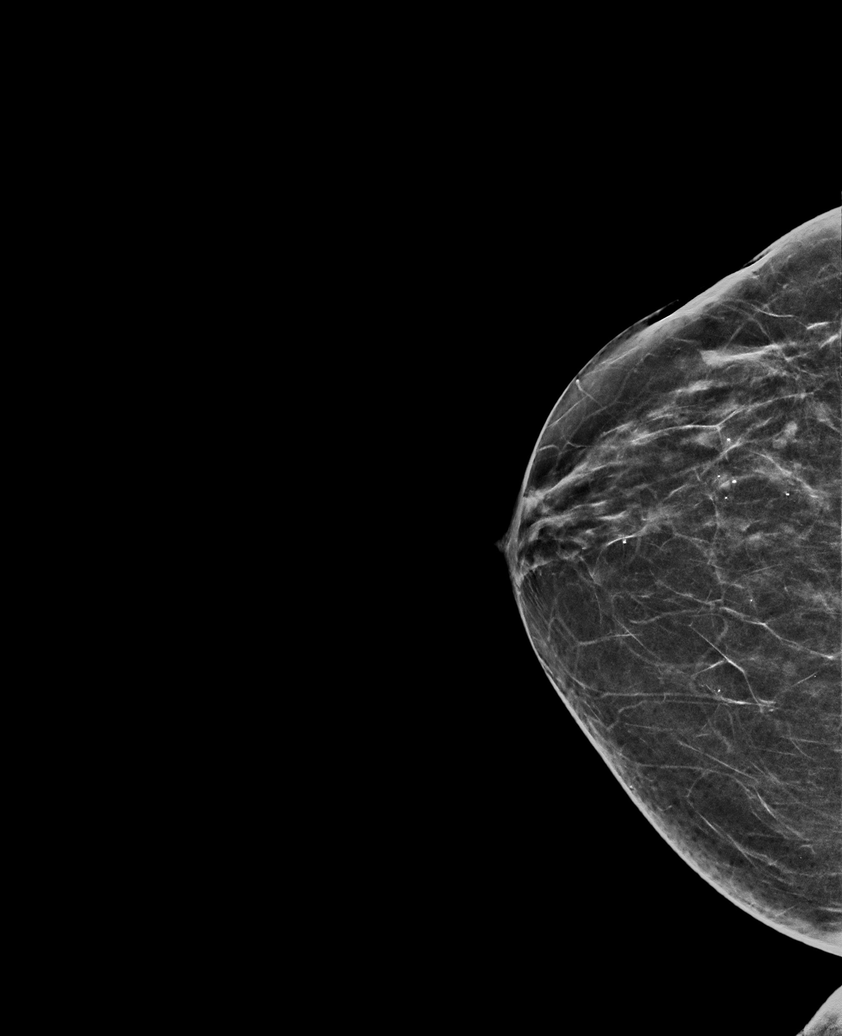

[R CC synth-2D (2 of 2)]
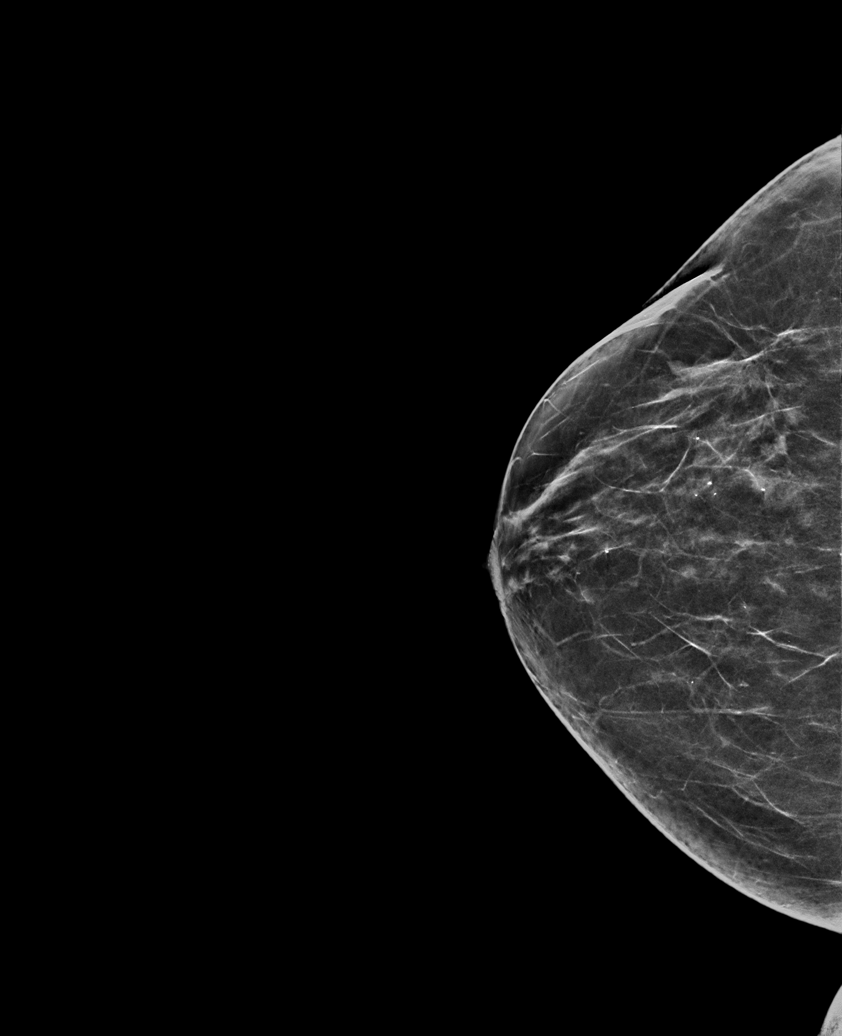

[R MLO synth-2D (1 of 2)]
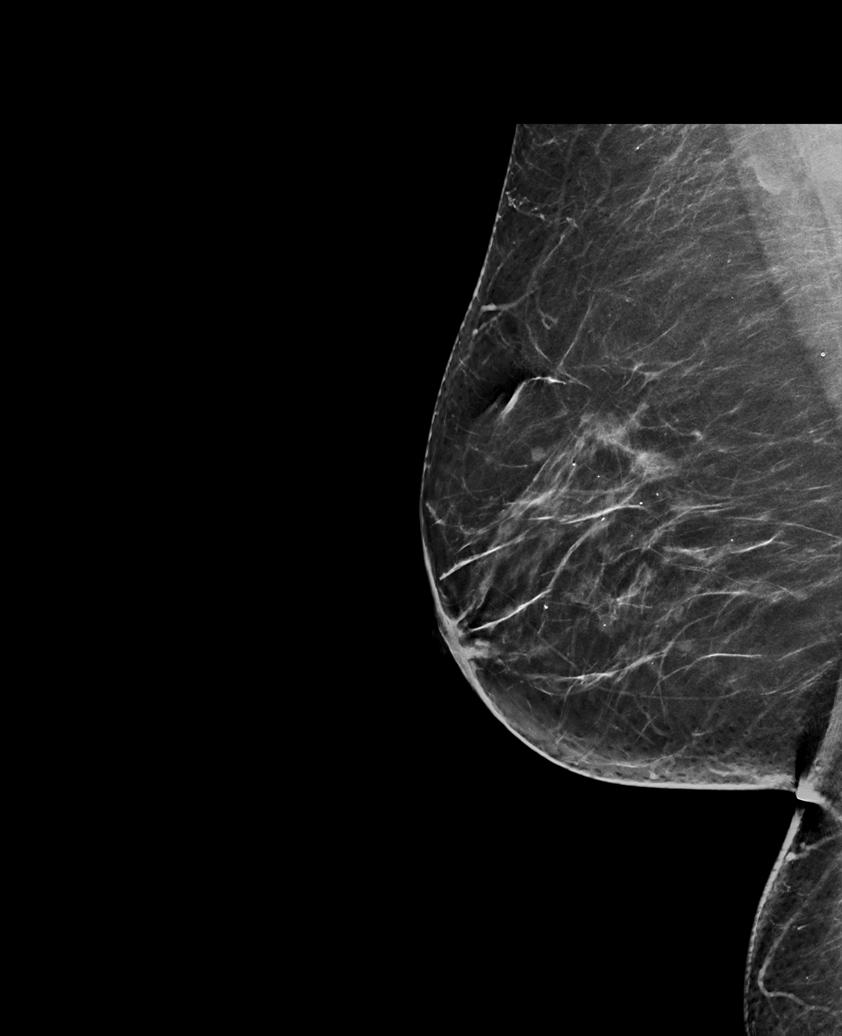

[R MLO synth-2D (2 of 2)]
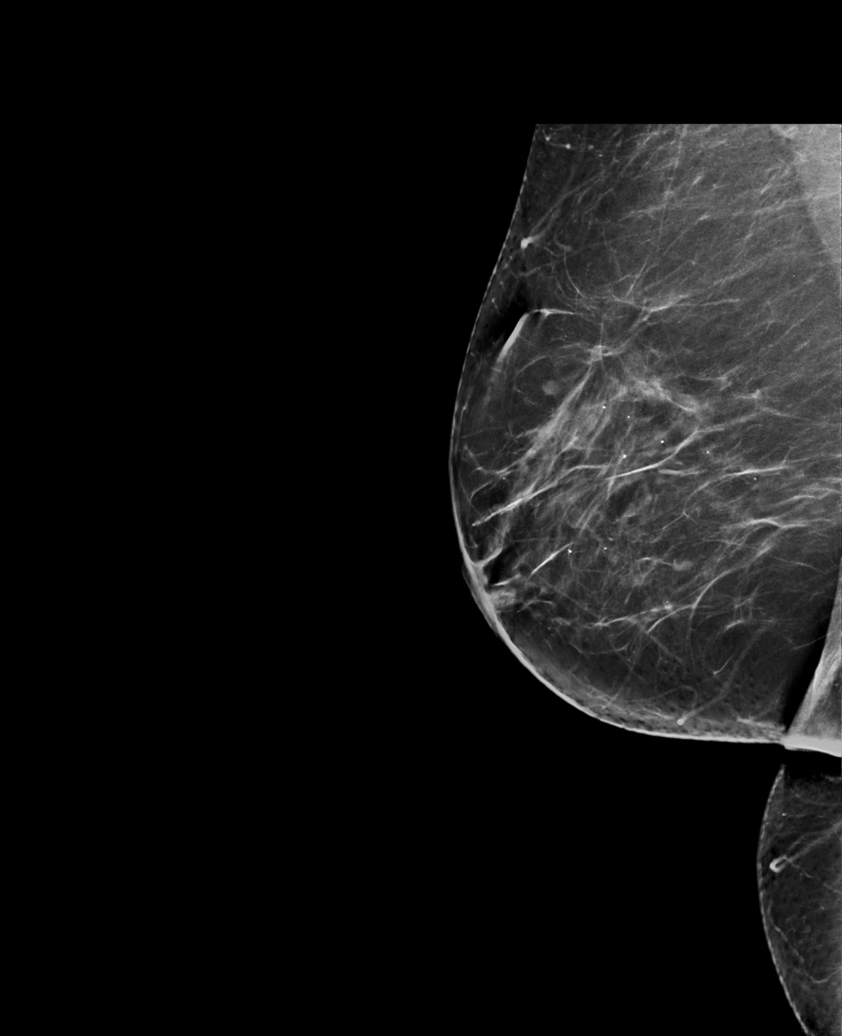

[L MLO synth-2D]
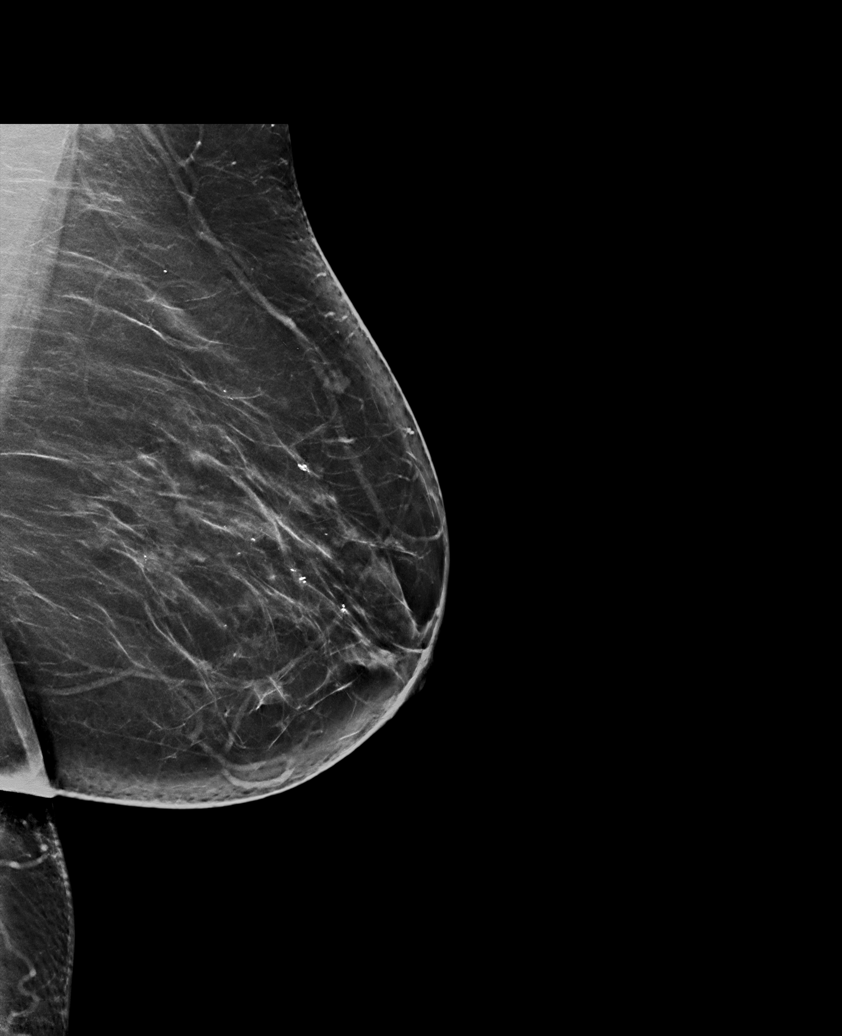

[6 of 36 positions shown; findings below may reference images not displayed]

ACR Breast Density Category b: There are scattered areas of
fibroglandular density.
FINDINGS: In the right breast, a possible mass warrants further evaluation. In
the left breast, no findings suspicious for malignancy. Images were
processed with CAD.
IMPRESSION: Further evaluation is suggested for possible mass in the right
breast.

RECOMMENDATION:
Diagnostic mammogram and possibly ultrasound of the right breast.
(Code:T1-A-550)

The patient will be contacted regarding the findings, and additional
imaging will be scheduled.

BI-RADS CATEGORY  0: Incomplete. Need additional imaging evaluation
and/or prior mammograms for comparison.

## 2021-08-20 ENCOUNTER — Inpatient Hospital Stay (HOSPITAL_COMMUNITY): Payer: Medicare PPO | Attending: Hematology

## 2021-08-20 DIAGNOSIS — Z79899 Other long term (current) drug therapy: Secondary | ICD-10-CM | POA: Diagnosis not present

## 2021-08-20 DIAGNOSIS — D0511 Intraductal carcinoma in situ of right breast: Secondary | ICD-10-CM | POA: Diagnosis not present

## 2021-08-20 DIAGNOSIS — Z79811 Long term (current) use of aromatase inhibitors: Secondary | ICD-10-CM | POA: Diagnosis not present

## 2021-08-20 LAB — COMPREHENSIVE METABOLIC PANEL
ALT: 18 U/L (ref 0–44)
AST: 20 U/L (ref 15–41)
Albumin: 3.8 g/dL (ref 3.5–5.0)
Alkaline Phosphatase: 70 U/L (ref 38–126)
Anion gap: 3 — ABNORMAL LOW (ref 5–15)
BUN: 14 mg/dL (ref 8–23)
CO2: 29 mmol/L (ref 22–32)
Calcium: 10.1 mg/dL (ref 8.9–10.3)
Chloride: 108 mmol/L (ref 98–111)
Creatinine, Ser: 0.64 mg/dL (ref 0.44–1.00)
GFR, Estimated: >60 mL/min (ref >60)
Glucose, Bld: 96 mg/dL (ref 70–99)
Potassium: 4.2 mmol/L (ref 3.5–5.1)
Sodium: 140 mmol/L (ref 135–145)
Total Bilirubin: 0.4 mg/dL (ref 0.3–1.2)
Total Protein: 6.8 g/dL (ref 6.5–8.1)

## 2021-08-20 LAB — CBC WITH DIFFERENTIAL/PLATELET
Abs Immature Granulocytes: 0.01 10*3/uL (ref 0.00–0.07)
Basophils Absolute: 0 10*3/uL (ref 0.0–0.1)
Basophils Relative: 0 %
Eosinophils Absolute: 0.2 10*3/uL (ref 0.0–0.5)
Eosinophils Relative: 6 %
HCT: 39.8 % (ref 36.0–46.0)
Hemoglobin: 12.7 g/dL (ref 12.0–15.0)
Immature Granulocytes: 0 %
Lymphocytes Relative: 31 %
Lymphs Abs: 1.1 10*3/uL (ref 0.7–4.0)
MCH: 28 pg (ref 26.0–34.0)
MCHC: 31.9 g/dL (ref 30.0–36.0)
MCV: 87.9 fL (ref 80.0–100.0)
Monocytes Absolute: 0.5 10*3/uL (ref 0.1–1.0)
Monocytes Relative: 13 %
Neutro Abs: 1.7 10*3/uL (ref 1.7–7.7)
Neutrophils Relative %: 50 %
Platelets: 187 10*3/uL (ref 150–400)
RBC: 4.53 MIL/uL (ref 3.87–5.11)
RDW: 12.9 % (ref 11.5–15.5)
WBC: 3.5 10*3/uL — ABNORMAL LOW (ref 4.0–10.5)
nRBC: 0 % (ref 0.0–0.2)

## 2021-08-20 LAB — VITAMIN D 25 HYDROXY (VIT D DEFICIENCY, FRACTURES): Vit D, 25-Hydroxy: 36.59 ng/mL (ref 30–100)

## 2021-08-21 DIAGNOSIS — D0511 Intraductal carcinoma in situ of right breast: Secondary | ICD-10-CM | POA: Diagnosis not present

## 2021-08-21 DIAGNOSIS — Z923 Personal history of irradiation: Secondary | ICD-10-CM | POA: Diagnosis not present

## 2021-08-27 ENCOUNTER — Inpatient Hospital Stay (HOSPITAL_COMMUNITY): Payer: Medicare PPO | Admitting: Hematology

## 2021-08-27 VITALS — BP 126/82 | HR 87 | Temp 97.9°F | Resp 16 | Ht 62.0 in | Wt 211.2 lb

## 2021-08-27 DIAGNOSIS — D0511 Intraductal carcinoma in situ of right breast: Secondary | ICD-10-CM

## 2021-08-27 DIAGNOSIS — Z79811 Long term (current) use of aromatase inhibitors: Secondary | ICD-10-CM | POA: Diagnosis not present

## 2021-08-27 DIAGNOSIS — Z79899 Other long term (current) drug therapy: Secondary | ICD-10-CM | POA: Diagnosis not present

## 2021-08-27 MED ORDER — ANASTROZOLE 1 MG PO TABS: 1.0000 mg | ORAL_TABLET | Freq: Every day | ORAL | 3 refills | Status: DC

## 2021-08-27 NOTE — Patient Instructions (Addendum)
[place] at [facility] Discharge Instructions  You were seen today by Dr. Delton Coombes. He went over your recent results. You will be scheduled for a mammogram in November 2023. You will be referred to dermatology (Dr. Nevada Crane) for the mole on your lower lip. Dr. Delton Coombes will see you back in 6 months for labs and follow up.   Thank you for choosing [place] at [facility] to provide your oncology and hematology care.  To afford each patient quality time with our provider, please arrive at least 15 minutes before your scheduled appointment time.   If you have a lab appointment with the [facility] please come in thru the Main Entrance and check in at the main information desk  You need to re-schedule your appointment should you arrive 10 or more minutes late.  We strive to give you quality time with our providers, and arriving late affects you and other patients whose appointments are after yours.  Also, if you no show three or more times for appointments you may be dismissed from the clinic at the providers discretion.     Again, thank you for choosing [facility].  Our hope is that these requests will decrease the amount of time that you wait before being seen by our physicians.       _____________________________________________________________  Should you have questions after your visit to [facility], please contact our office at [phone] between the hours of 8:00 a.m. and 4:30 p.m.  Voicemails left after 4:00 p.m. will not be returned until the following business day.  For prescription refill requests, have your pharmacy contact our office and allow 72 hours.    Cancer Center Support Programs:   > Cancer Support Group  2nd Tuesday of the month 1pm-2pm, Journey Room

## 2021-08-27 NOTE — Progress Notes (Signed)
[facility] [address] [place] [address]   Patient Care Team: Sharilyn Sites, MD as PCP - General (Family Medicine) Danie Binder, MD (Inactive) (Gastroenterology) Donetta Potts, RN as Oncology Nurse Navigator Derek Jack, MD as Medical Oncologist (Oncology)  SUMMARY OF ONCOLOGIC HISTORY: Oncology History  Ductal carcinoma in situ (DCIS) of right breast  03/09/2019 Initial Diagnosis   Ductal carcinoma in situ (DCIS) of right breast    04/05/2019 Cancer Staging   Staging form: Breast, AJCC 8th Edition - Clinical stage from 04/05/2019: Stage 0 (cTis (DCIS), cN0, cM0, ER+, PR+, HER2: Not Assessed) - Signed by Derek Jack, MD on 04/05/2019      CHIEF COMPLIANT: Follow-up for right breast cancer   INTERVAL HISTORY: [name] is a 71 y.o. female here today for follow up of her right breast cancer. Her last visit was on 02/26/2021.   Today she reports feeling good. She reports occasional hot flashes, and she reports occasional right shoulder pain due to arthritis. She continues to take anastrozole.   REVIEW OF SYSTEMS:   Review of Systems  Constitutional:  Negative for appetite change and fatigue.  Endocrine: Positive for hot flashes (occasionakl).  Musculoskeletal:  Positive for arthralgias (R shoulder + hip).  Neurological:  Positive for numbness (R + L sides).  All other systems reviewed and are negative.  I have reviewed the past medical history, past surgical history, social history and family history with the patient and they are unchanged from previous note.   ALLERGIES:   is allergic to anesthetics, amide and tylox [oxycodone-acetaminophen].   MEDICATIONS:  Current Outpatient Medications  Medication Sig Dispense Refill   anastrozole (ARIMIDEX) 1 MG tablet Take 1 tablet (1 mg total) by mouth daily. 90 tablet 3   aspirin EC 81 MG tablet Take 81 mg by mouth every other day.     celecoxib (CELEBREX) 200 MG capsule  Take 200 mg by mouth as needed.     Cholecalciferol (VITAMIN D3 PO) Take by mouth.     losartan-hydrochlorothiazide (HYZAAR) 50-12.5 MG tablet Take 1 tablet by mouth daily.     MAGNESIUM PO Take by mouth.     OMEGA 3 1200 MG CAPS Take 1,200 mg by mouth daily.      vitamin B-12 (CYANOCOBALAMIN) 500 MCG tablet Take 500 mcg by mouth.     No current facility-administered medications for this visit.     PHYSICAL EXAMINATION: Performance status (ECOG): 1 - Symptomatic but completely ambulatory  There were no vitals filed for this visit. Wt Readings from Last 3 Encounters:  08/13/21 209 lb 12.8 oz (95.2 kg)  02/26/21 217 lb 3.2 oz (98.5 kg)  10/25/20 220 lb (99.8 kg)   Physical Exam Vitals reviewed.  Constitutional:      Appearance: Normal appearance.  Cardiovascular:     Rate and Rhythm: Normal rate and regular rhythm.     Pulses: Normal pulses.     Heart sounds: Normal heart sounds.  Pulmonary:     Effort: Pulmonary effort is normal.     Breath sounds: Normal breath sounds.  Chest:  Breasts:    Right: Tenderness present. No swelling, bleeding, inverted nipple, mass, nipple discharge or skin change (UOQ lumpectomy scar WNL).     Left: No swelling, bleeding, inverted nipple, mass, nipple discharge, skin change or tenderness.  Neurological:     General: No focal deficit present.     Mental Status: She is alert and oriented to person,  place, and time.  Psychiatric:        Mood and Affect: Mood normal.        Behavior: Behavior normal.    Breast Exam Chaperone: Thana Ates     LABORATORY DATA:  I have reviewed the data as listed    Latest Ref Rng & Units 08/20/2021    1:40 PM 02/19/2021   11:49 AM 07/03/2020    1:33 PM  CMP  Glucose 70 - 99 mg/dL 96   106   99    BUN 8 - 23 mg/dL _0 Creatinine 0.44 - 1.00 mg/dL 0.64   0.66   0.63    Sodium 135 - 145 mmol/L 140   139   140    Potassium 3.5 - 5.1 mmol/L 4.2   4.4   4.2    Chloride 98 - 111 mmol/L 108   104    103    CO2 22 - 32 mmol/L _1 Calcium 8.9 - 10.3 mg/dL 10.1   10.0   10.1    Total Protein 6.5 - 8.1 g/dL 6.8   6.7   6.7    Total Bilirubin 0.3 - 1.2 mg/dL 0.4   0.8   0.5    Alkaline Phos 38 - 126 U/L 70   73   62    AST 15 - 41 U/L _2 ALT 0 - 44 U/L _3 No results found for: [id number] Lab Results  Component Value Date   WBC 3.5 (L) 08/20/2021   HGB 12.7 08/20/2021   HCT 39.8 08/20/2021   MCV 87.9 08/20/2021   PLT 187 08/20/2021   NEUTROABS 1.7 08/20/2021    ASSESSMENT:  1.  Intermediate grade right breast DCIS: -Screening mammogram on 01/31/2019 was B RADS category 0.  Additional views of the right breast showed intermediate 0.4 cm mass within the upper outer right breast which is new. -Right breast biopsy at 10 o'clock position on 02/15/2019 consistent with DCIS, intermediate grade, ER/PR positive. -Right partial mastectomy on 03/09/2019 showing 0.6 cm involving an intraductal papilloma, intermediate grade, in situ carcinoma less than 1 mm from inferior margin, not on ink.  Other margins negative.  Right inferior margin excision shows low-grade DCIS, 0.6 cm.  Not completely clear if it is a 2 foci or DCIS. -XRT completed in [place] in February 2021. - Anastrozole started in February 2021.   2.  Bone density: -Bone density on 04/15/2019 with T score of -0.7.   3.  Family history: -2 sisters had breast cancer. -Father had lymphoma. -Brother had prostate cancer -niece had ovarian cancer -paternal aunt had stomach cancer   PLAN:  1.  Intermediate grade right breast DCIS: - She is tolerating anastrozole very well.  Occasional hot flashes with no significant arthralgias. - Last mammogram on 02/19/2021, BI-RADS Category 2. - Physical examination shows right lumpectomy scar in the upper outer quadrant is unchanged.  No palpable masses in bilateral breasts.  No palpable adenopathy. - Reviewed labs which showed normal LFTs and CBC. - Schedule  bilateral screening mammogram after 02/19/2022.  RTC 6 months for follow-up with repeat labs. - She has a mole on the center of the lower lip for the last 2 years.  This has not changed recently.  However she is very concerned.  We will make referral to dermatology.   2.  Bone density: - Vitamin D level is 36.  Continue vitamin D 2000 units daily.   Breast Cancer therapy associated bone loss: I have recommended calcium, Vitamin D and weight bearing exercises.  Orders placed this encounter:  No orders of the defined types were placed in this encounter.   The patient has a good understanding of the overall plan. She agrees with it. She will call with any problems that may develop before the next visit here.  Derek Jack, MD [place] [phone]   I, Thana Ates, am acting as a scribe for Dr. Derek Jack.  I, Derek Jack MD, have reviewed the above documentation for accuracy and completeness, and I agree with the above.

## 2021-08-27 NOTE — Progress Notes (Signed)
Patient is taking Anastrozole as prescribed.  She has not missed any doses and reports no side effects at this time.   

## 2021-09-19 ENCOUNTER — Encounter: Payer: Self-pay | Admitting: *Deleted

## 2021-09-19 NOTE — Patient Instructions (Signed)
Referring MD/PCP:  Dr. Hilma Favors  Procedure: Colonoscopy  Rare blood in stool only when very constipated  Has patient had this procedure before?  Yes, 09/19/11, Dr. Oneida Alar  If so, when, by whom and where?    Is there a family history of colon cancer?  no  Who?  What age when diagnosed?    Is patient diabetic? If yes, Type 1 or Type 2   no      Does patient have prosthetic heart valve or mechanical valve?  no  Do you have a pacemaker/defibrillator?  no  Has patient ever had endocarditis/atrial fibrillation? no  Does patient use oxygen? no  Has patient had joint replacement within last 12 months?  no  Is patient constipated or do they take laxatives? no  Does patient have a history of alcohol/drug use?  no  Have you had a stroke/heart attack last 6 mths? no  Do you take medicine for weight loss?  no  For female patients,: have you had a hysterectomy yes                      are you post menopausal yes                      do you still have your menstrual cycle no  Is patient on blood thinner such as Coumadin, Plavix and/or Aspirin? Yes, aspirin '81mg'$   Medications:  Current Outpatient Medications on File Prior to Visit  Medication Sig Dispense Refill   anastrozole (ARIMIDEX) 1 MG tablet Take 1 tablet (1 mg total) by mouth daily. 90 tablet 3   aspirin EC 81 MG tablet Take 81 mg by mouth every other day.     celecoxib (CELEBREX) 200 MG capsule Take 200 mg by mouth as needed. (Patient not taking: Reported on 08/27/2021)     Cholecalciferol (VITAMIN D3 PO) Take by mouth.     losartan-hydrochlorothiazide (HYZAAR) 50-12.5 MG tablet Take 1 tablet by mouth daily.     MAGNESIUM PO Take by mouth. (Patient not taking: Reported on 08/27/2021)     OMEGA 3 1200 MG CAPS Take 1,200 mg by mouth daily.      No current facility-administered medications on file prior to visit.     Allergies:  Allergies  Allergen Reactions   Anesthetics, Amide Palpitations    Pt reports Tachycardia w/ HRs  170's with General Anesthesia but cannot give details regarding names of medications.   Tylox [Oxycodone-Acetaminophen] Palpitations    Tachycardia      Estimated body mass index is 38.63 kg/m as calculated from the following:   Height as of 08/27/21: '5\' 2"'$  (1.575 m).   Weight as of 08/27/21: 211 lb 3.2 oz (95.8 kg).

## 2021-10-02 NOTE — Progress Notes (Signed)
ASA 2, okay to schedule.  Needs BMET prior to procedure due to diuretic.

## 2021-10-03 ENCOUNTER — Encounter (INDEPENDENT_AMBULATORY_CARE_PROVIDER_SITE_OTHER): Payer: Self-pay

## 2021-10-03 ENCOUNTER — Other Ambulatory Visit (INDEPENDENT_AMBULATORY_CARE_PROVIDER_SITE_OTHER): Payer: Self-pay

## 2021-10-03 DIAGNOSIS — Z1211 Encounter for screening for malignant neoplasm of colon: Secondary | ICD-10-CM

## 2021-10-07 ENCOUNTER — Encounter (INDEPENDENT_AMBULATORY_CARE_PROVIDER_SITE_OTHER): Payer: Self-pay

## 2021-10-29 ENCOUNTER — Other Ambulatory Visit: Payer: Self-pay | Admitting: *Deleted

## 2021-10-29 ENCOUNTER — Other Ambulatory Visit: Payer: Self-pay

## 2021-10-29 ENCOUNTER — Encounter (HOSPITAL_COMMUNITY): Payer: Self-pay

## 2021-10-29 ENCOUNTER — Encounter (HOSPITAL_COMMUNITY)
Admission: RE | Admit: 2021-10-29 | Discharge: 2021-10-29 | Disposition: A | Discharge status: Home / Self Care | Payer: Medicare PPO | Source: Ambulatory Visit | Attending: Internal Medicine | Admitting: Internal Medicine

## 2021-10-29 VITALS — BP 136/95 | HR 84 | Temp 97.6°F | Resp 18 | Ht 62.0 in | Wt 211.2 lb

## 2021-10-29 DIAGNOSIS — Z01810 Encounter for preprocedural cardiovascular examination: Secondary | ICD-10-CM | POA: Insufficient documentation

## 2021-10-29 DIAGNOSIS — I10 Essential (primary) hypertension: Secondary | ICD-10-CM

## 2021-10-29 MED ORDER — PEG 3350-KCL-NA BICARB-NACL 420 G PO SOLR: 4000.0000 mL | Freq: Once | ORAL | 0 refills | Status: AC

## 2021-10-29 NOTE — Patient Instructions (Signed)
Chelsey Johnson  [birth year]     '@PREFPERIOPPHARMACY'$ @   Your procedure is scheduled on 10/31/2021.  Report to [facility] at 8:00 A.M.  Call this number if you have problems the morning of surgery:  [phone]   Remember:    Please follow the diet and prep instructions given to you by Dr Ave Filter office.     Take these medicines the morning of surgery with A SIP OF WATER : Armidex and Celebrex    Do not wear jewelry, make-up or nail polish.  Do not wear lotions, powders, or perfumes, or deodorant.  Do not shave 48 hours prior to surgery.  Men may shave face and neck.  Do not bring valuables to the hospital.  [facility] is not responsible for any belongings or valuables.  Contacts, dentures or bridgework may not be worn into surgery.  Leave your suitcase in the car.  After surgery it may be brought to your room.  For patients admitted to the hospital, discharge time will be determined by your treatment team.  Patients discharged the day of surgery will not be allowed to drive home.   Name and phone number of your driver:   Family Special instructions:  N/A  Please read over the following fact sheets that you were given. Care and Recovery After Surgery  Colonoscopy, Adult A colonoscopy is a procedure to look at the entire large intestine. This procedure is done using a long, thin, flexible tube that has a camera on the end. You may have a colonoscopy: As a part of normal colorectal screening. If you have certain symptoms, such as: A low number of red blood cells in your blood (anemia). Diarrhea that does not go away. Pain in your abdomen. Blood in your stool. A colonoscopy can help screen for and diagnose medical problems, including: An abnormal growth of cells or tissue (tumor). Abnormal growths within the lining of your intestine (polyps). Inflammation. Areas of bleeding. Tell your health care provider about: Any allergies you have. All medicines you are taking,  including vitamins, herbs, eye drops, creams, and over-the-counter medicines. Any problems you or family members have had with anesthetic medicines. Any bleeding problems you have. Any surgeries you have had. Any medical conditions you have. Any problems you have had with having bowel movements. Whether you are pregnant or may be pregnant. What are the risks? Generally, this is a safe procedure. However, problems may occur, including: Bleeding. Damage to your intestine. Allergic reactions to medicines given during the procedure. Infection. This is rare. What happens before the procedure? Eating and drinking restrictions Follow instructions from your health care provider about eating or drinking restrictions, which may include: A few days before the procedure: Follow a low-fiber diet. Avoid nuts, seeds, dried fruit, raw fruits, and vegetables. 1-3 days before the procedure: Eat only gelatin dessert or ice pops. Drink only clear liquids, such as water, clear juice, clear broth or bouillon, black coffee or tea, or clear soft drinks or sports drinks. Avoid liquids that contain red or purple dye. The day of the procedure: Do not eat solid foods. You may continue to drink clear liquids until up to 2 hours before the procedure. Do not eat or drink anything starting 2 hours before the procedure, or within the time period that your health care provider recommends. Bowel prep If you were prescribed a bowel prep to take by mouth (orally) to clean out your colon: Take it as told by your health care provider.  Starting the day before your procedure, you will need to drink a large amount of liquid medicine. The liquid will cause you to have many bowel movements of loose stool until your stool becomes almost clear or light green. If your skin or the opening between the buttocks (anus) gets irritated from diarrhea, you may relieve the irritation using: Wipes with medicine in them, such as adult wet  wipes with aloe and vitamin E. A product to soothe skin, such as petroleum jelly. If you vomit while drinking the bowel prep: Take a break for up to 60 minutes. Begin the bowel prep again. Call your health care provider if you keep vomiting or you cannot take the bowel prep without vomiting. To clean out your colon, you may also be given: Laxative medicines. These help you have a bowel movement. Instructions for enema use. An enema is liquid medicine injected into your rectum. Medicines Ask your health care provider about: Changing or stopping your regular medicines or supplements. This is especially important if you are taking iron supplements, diabetes medicines, or blood thinners. Taking medicines such as aspirin and ibuprofen. These medicines can thin your blood. Do not take these medicines unless your health care provider tells you to take them. Taking over-the-counter medicines, vitamins, herbs, and supplements. General instructions Ask your health care provider what steps will be taken to help prevent infection. These may include washing skin with a germ-killing soap. If you will be going home right after the procedure, plan to have a responsible adult: Take you home from the hospital or clinic. You will not be allowed to drive. Care for you for the time you are told. What happens during the procedure?  An IV will be inserted into one of your veins. You will be given a medicine to make you fall asleep (general anesthetic). You will lie on your side with your knees bent. A lubricant will be put on the tube. Then the tube will be: Inserted into your anus. Gently eased through all parts of your large intestine. Air will be sent into your colon to keep it open. This may cause some pressure or cramping. Images will be taken with the camera and will appear on a screen. A small tissue sample may be removed to be looked at under a microscope (biopsy). The tissue may be sent to a lab for  testing if any signs of problems are found. If small polyps are found, they may be removed and checked for cancer cells. When the procedure is finished, the tube will be removed. The procedure may vary among health care providers and hospitals. What happens after the procedure? Your blood pressure, heart rate, breathing rate, and blood oxygen level will be monitored until you leave the hospital or clinic. You may have a small amount of blood in your stool. You may pass gas and have mild cramping or bloating in your abdomen. This is caused by the air that was used to open your colon during the exam. If you were given a sedative during the procedure, it can affect you for several hours. Do not drive or operate machinery until your health care provider says that it is safe. It is up to you to get the results of your procedure. Ask your health care provider, or the department that is doing the procedure, when your results will be ready. Summary A colonoscopy is a procedure to look at the entire large intestine. Follow instructions from your health care provider about eating and  drinking before the procedure. If you were prescribed an oral bowel prep to clean out your colon, take it as told by your health care provider. During the colonoscopy, a flexible tube with a camera on its end is inserted into the anus and then passed into all parts of the large intestine. This information is not intended to replace advice given to you by your health care provider. Make sure you discuss any questions you have with your health care provider. Document Revised: 03/11/2021 Document Reviewed: 11/07/2020 Elsevier Patient Education  [place] Anesthesia refers to techniques, procedures, and medicines that help a person stay safe and comfortable during a medical or dental procedure. Monitored anesthesia care, or sedation, is one type of anesthesia. Your anesthesia specialist may  recommend sedation if you will be having a procedure that does not require you to be unconscious. You may have this procedure for: Cataract surgery. A dental procedure. A biopsy. A colonoscopy. During the procedure, you may receive a medicine to help you relax (sedative). There are three levels of sedation: Mild sedation. At this level, you may feel awake and relaxed. You will be able to follow directions. Moderate sedation. At this level, you will be sleepy. You may not remember the procedure. Deep sedation. At this level, you will be asleep. You will not remember the procedure. The more medicine you are given, the deeper your level of sedation will be. Depending on how you respond to the procedure, the anesthesia specialist may change your level of sedation or the type of anesthesia to fit your needs. An anesthesia specialist will monitor you closely during the procedure. Tell a health care provider about: Any allergies you have. All medicines you are taking, including vitamins, herbs, eye drops, creams, and over-the-counter medicines. Any problems you or family members have had with anesthetic medicines. Any blood disorders you have. Any surgeries you have had. Any medical conditions you have, such as sleep apnea. Whether you are pregnant or may be pregnant. Whether you use cigarettes, alcohol, or drugs. Any use of steroids, whether by mouth or as a cream. What are the risks? Generally, this is a safe procedure. However, problems may occur, including: Getting too much medicine (oversedation). Nausea. Allergic reaction to medicines. Trouble breathing. If this happens, a breathing tube may be used to help with breathing. It will be removed when you are awake and breathing on your own. Heart trouble. Lung trouble. Confusion that gets better with time (emergence delirium). What happens before the procedure? Staying hydrated Follow instructions from your health care provider about  hydration, which may include: Up to 2 hours before the procedure - you may continue to drink clear liquids, such as water, clear fruit juice, black coffee, and plain tea. Eating and drinking restrictions Follow instructions from your health care provider about eating and drinking, which may include: 8 hours before the procedure - stop eating heavy meals or foods, such as meat, fried foods, or fatty foods. 6 hours before the procedure - stop eating light meals or foods, such as toast or cereal. 6 hours before the procedure - stop drinking milk or drinks that contain milk. 2 hours before the procedure - stop drinking clear liquids. Medicines Ask your health care provider about: Changing or stopping your regular medicines. This is especially important if you are taking diabetes medicines or blood thinners. Taking medicines such as aspirin and ibuprofen. These medicines can thin your blood. Do not take these medicines unless  your health care provider tells you to take them. Taking over-the-counter medicines, vitamins, herbs, and supplements. Tests and exams You will have a physical exam. You may have blood tests done to show: How well your kidneys and liver are working. How well your blood can clot. General instructions Plan to have a responsible adult take you home from the hospital or clinic. If you will be going home right after the procedure, plan to have a responsible adult care for you for the time you are told. This is important. What happens during the procedure?  Your blood pressure, heart rate, breathing, level of pain, and overall condition will be monitored. An IV will be inserted into one of your veins. You will be given medicines as needed to keep you comfortable during the procedure. This may mean changing the level of sedation. Depending on your age or the procedure, the sedative may be given: As a pill that you will swallow or as a pill that is inserted into the rectum. As an  injection into the vein or muscle. As a spray through the nose. The procedure will be performed. Your breathing, heart rate, and blood pressure will be monitored during the procedure. When the procedure is over, the medicine will be stopped. The procedure may vary among health care providers and hospitals. What happens after the procedure? Your blood pressure, heart rate, breathing rate, and blood oxygen level will be monitored until you leave the hospital or clinic. You may feel sleepy, clumsy, or nauseous. You may feel forgetful about what happened after the procedure. You may vomit. You may continue to get IV fluids. Do not drive or operate machinery until your health care provider says that it is safe. Summary Monitored anesthesia care is used to keep a patient comfortable during short procedures. Tell your health care provider about any allergies or health conditions you have and about all the medicines you are taking. Before the procedure, follow instructions about when to stop eating and drinking and about changing or stopping any medicines. Your blood pressure, heart rate, breathing rate, and blood oxygen level will be monitored until you leave the hospital or clinic. Plan to have a responsible adult take you home from the hospital or clinic. This information is not intended to replace advice given to you by your health care provider. Make sure you discuss any questions you have with your health care provider. Document Revised: 02/19/2021 Document Reviewed: 02/17/2019 Elsevier Patient Education  [place].

## 2021-10-31 ENCOUNTER — Encounter (HOSPITAL_COMMUNITY): Payer: Self-pay

## 2021-10-31 ENCOUNTER — Ambulatory Visit (HOSPITAL_COMMUNITY)
Admission: RE | Admit: 2021-10-31 | Discharge: 2021-10-31 | Disposition: A | Discharge status: Home / Self Care | Payer: Medicare PPO | Attending: Internal Medicine | Admitting: Internal Medicine

## 2021-10-31 ENCOUNTER — Ambulatory Visit (HOSPITAL_BASED_OUTPATIENT_CLINIC_OR_DEPARTMENT_OTHER): Payer: Medicare PPO | Admitting: Anesthesiology

## 2021-10-31 ENCOUNTER — Ambulatory Visit (HOSPITAL_COMMUNITY): Payer: Medicare PPO | Admitting: Anesthesiology

## 2021-10-31 ENCOUNTER — Encounter (HOSPITAL_COMMUNITY)
Admission: RE | Disposition: A | Discharge status: Home / Self Care | Payer: Self-pay | Source: Home / Self Care | Attending: Internal Medicine

## 2021-10-31 DIAGNOSIS — Z1211 Encounter for screening for malignant neoplasm of colon: Secondary | ICD-10-CM

## 2021-10-31 DIAGNOSIS — I10 Essential (primary) hypertension: Secondary | ICD-10-CM | POA: Insufficient documentation

## 2021-10-31 DIAGNOSIS — D123 Benign neoplasm of transverse colon: Secondary | ICD-10-CM | POA: Diagnosis not present

## 2021-10-31 DIAGNOSIS — Q438 Other specified congenital malformations of intestine: Secondary | ICD-10-CM | POA: Diagnosis not present

## 2021-10-31 DIAGNOSIS — K648 Other hemorrhoids: Secondary | ICD-10-CM | POA: Diagnosis not present

## 2021-10-31 DIAGNOSIS — Z1212 Encounter for screening for malignant neoplasm of rectum: Secondary | ICD-10-CM | POA: Diagnosis not present

## 2021-10-31 DIAGNOSIS — Z87891 Personal history of nicotine dependence: Secondary | ICD-10-CM | POA: Diagnosis not present

## 2021-10-31 DIAGNOSIS — K635 Polyp of colon: Secondary | ICD-10-CM | POA: Insufficient documentation

## 2021-10-31 HISTORY — PX: POLYPECTOMY: SHX5525

## 2021-10-31 HISTORY — PX: COLONOSCOPY WITH PROPOFOL: SHX5780

## 2021-10-31 SURGERY — COLONOSCOPY WITH PROPOFOL
Anesthesia: General

## 2021-10-31 MED ORDER — PROPOFOL 10 MG/ML IV BOLUS
INTRAVENOUS | Status: DC | PRN
  Administered 2021-10-31: 100 mg via INTRAVENOUS

## 2021-10-31 MED ORDER — LACTATED RINGERS IV SOLN: INTRAVENOUS | Status: DC | PRN

## 2021-10-31 MED ORDER — LIDOCAINE 2% (20 MG/ML) 5 ML SYRINGE
INTRAMUSCULAR | Status: DC | PRN
  Administered 2021-10-31: 50 mg via INTRAVENOUS

## 2021-10-31 MED ORDER — PROPOFOL 500 MG/50ML IV EMUL
INTRAVENOUS | Status: DC | PRN
  Administered 2021-10-31: 150 ug/kg/min via INTRAVENOUS

## 2021-10-31 NOTE — Anesthesia Preprocedure Evaluation (Signed)
Anesthesia Evaluation  Patient identified by MRN, date of birth, ID band Patient awake    Reviewed: Allergy & Precautions, H&P , NPO status , Patient's Chart, lab work & pertinent test results, reviewed documented beta blocker date and time   Airway Mallampati: II  TM Distance: >3 FB Neck ROM: full    Dental no notable dental hx.    Pulmonary neg pulmonary ROS, former smoker,    Pulmonary exam normal breath sounds clear to auscultation       Cardiovascular Exercise Tolerance: Good hypertension, negative cardio ROS   Rhythm:regular Rate:Normal     Neuro/Psych negative neurological ROS  negative psych ROS   GI/Hepatic negative GI ROS, Neg liver ROS,   Endo/Other  negative endocrine ROS  Renal/GU negative Renal ROS  negative genitourinary   Musculoskeletal   Abdominal   Peds  Hematology negative hematology ROS (+)   Anesthesia Other Findings   Reproductive/Obstetrics negative OB ROS                             Anesthesia Physical Anesthesia Plan  ASA: 2  Anesthesia Plan: General   Post-op Pain Management:    Induction:   PONV Risk Score and Plan: Propofol infusion  Airway Management Planned:   Additional Equipment:   Intra-op Plan:   Post-operative Plan:   Informed Consent: I have reviewed the patients History and Physical, chart, labs and discussed the procedure including the risks, benefits and alternatives for the proposed anesthesia with the patient or authorized representative who has indicated his/her understanding and acceptance.     Dental Advisory Given  Plan Discussed with: CRNA  Anesthesia Plan Comments: (Pt has a history of tachycardia after a procedure one time.  Will monitor.)        Anesthesia Quick Evaluation

## 2021-10-31 NOTE — Discharge Instructions (Signed)
  Colonoscopy Discharge Instructions  Read the instructions outlined below and refer to this sheet in the next few weeks. These discharge instructions provide you with general information on caring for yourself after you leave the hospital. Your doctor may also give you specific instructions. While your treatment has been planned according to the most current medical practices available, unavoidable complications occasionally occur.   ACTIVITY You may resume your regular activity, but move at a slower pace for the next 24 hours.  Take frequent rest periods for the next 24 hours.  Walking will help get rid of the air and reduce the bloated feeling in your belly (abdomen).  No driving for 24 hours (because of the medicine (anesthesia) used during the test).   Do not sign any important legal documents or operate any machinery for 24 hours (because of the anesthesia used during the test).  NUTRITION Drink plenty of fluids.  You may resume your normal diet as instructed by your doctor.  Begin with a light meal and progress to your normal diet. Heavy or fried foods are harder to digest and may make you feel sick to your stomach (nauseated).  Avoid alcoholic beverages for 24 hours or as instructed.  MEDICATIONS You may resume your normal medications unless your doctor tells you otherwise.  WHAT YOU CAN EXPECT TODAY Some feelings of bloating in the abdomen.  Passage of more gas than usual.  Spotting of blood in your stool or on the toilet paper.  IF YOU HAD POLYPS REMOVED DURING THE COLONOSCOPY: No aspirin products for 7 days or as instructed.  No alcohol for 7 days or as instructed.  Eat a soft diet for the next 24 hours.  FINDING OUT THE RESULTS OF YOUR TEST Not all test results are available during your visit. If your test results are not back during the visit, make an appointment with your caregiver to find out the results. Do not assume everything is normal if you have not heard from your  caregiver or the medical facility. It is important for you to follow up on all of your test results.  SEEK IMMEDIATE MEDICAL ATTENTION IF: You have more than a spotting of blood in your stool.  Your belly is swollen (abdominal distention).  You are nauseated or vomiting.  You have a temperature over 101.  You have abdominal pain or discomfort that is severe or gets worse throughout the day.   Your colonoscopy revealed 1 polyp(s) which I removed successfully. Await pathology results, my office will contact you. I recommend repeating colonoscopy in 5 years for surveillance purposes. Otherwise follow up with Gi as needed.    I hope you have a great rest of your week!  Chelsey Johnson. Abbey Chatters, D.O. Gastroenterology and Hepatology [facility]

## 2021-10-31 NOTE — Transfer of Care (Signed)
Immediate Anesthesia Transfer of Care Note  Patient: Chelsey Johnson  Procedure(s) Performed: COLONOSCOPY WITH PROPOFOL POLYPECTOMY  Patient Location: Short Stay  Anesthesia Type:MAC  Level of Consciousness: awake, alert , oriented and patient cooperative  Airway & Oxygen Therapy: Patient Spontanous Breathing  Post-op Assessment: Report given to RN, Post -op Vital signs reviewed and stable and Patient moving all extremities  Post vital signs: Reviewed and stable  Last Vitals:  Vitals Value Taken Time  BP 121/62 10/31/21 1019  Temp 36.6 C 10/31/21 1019  Pulse 75 10/31/21 1019  Resp 15 10/31/21 1019  SpO2      Last Pain:  Vitals:   10/31/21 1019  TempSrc: Oral  PainSc:          Complications: No notable events documented.

## 2021-10-31 NOTE — Op Note (Signed)
[facility] Patient Name: Chelsey Johnson Procedure Date: 10/31/2021 9:38 AM MRN: [id number] Date of Birth: [birth year] Attending MD: Elon Alas. Edgar Frisk CSN: [id number] Age: 71 Admit Type: Outpatient Procedure:                Colonoscopy Indications:              Screening for colorectal malignant neoplasm Providers:                Elon Alas. Abbey Chatters, DO, Charlsie Quest. Insurance claims handler, Therapist, sports,                            Suzan Garibaldi. Risa Grill, Technician Referring MD:             Elon Alas. Abbey Chatters, DO Medicines:                See the Anesthesia note for documentation of the                            administered medications Complications:            No immediate complications. Estimated Blood Loss:     Estimated blood loss was minimal. Procedure:                Pre-Anesthesia Assessment:                           - The anesthesia plan was to use monitored                            anesthesia care (MAC).                           After obtaining informed consent, the colonoscope                            was passed under direct vision. Throughout the                            procedure, the patient's blood pressure, pulse, and                            oxygen saturations were monitored continuously. The                            [id number]-HQ190L ([id number]) scope was introduced through                            the anus and advanced to the the cecum, identified                            by appendiceal orifice and ileocecal valve. The                            colonoscopy was technically difficult and complex  due to a redundant colon and significant looping.                            Successful completion of the procedure was aided by                            changing the patient to a supine position and                            applying abdominal pressure. The patient tolerated                            the procedure well. The quality of the bowel                             preparation was evaluated using the BBPS [facility]                            Bowel Preparation Scale) with scores of: Right                            Colon = 3, Transverse Colon = 3 and Left Colon = 3                            (entire mucosa seen well with no residual staining,                            small fragments of stool or opaque liquid). The                            total BBPS score equals 9. Scope In: 9:50:15 AM Scope Out: 10:12:14 AM Scope Withdrawal Time: 0 hours 6 minutes 57 seconds  Total Procedure Duration: 0 hours 21 minutes 59 seconds  Findings:      The perianal and digital rectal examinations were normal.      Non-bleeding internal hemorrhoids were found during endoscopy.      A 4 mm polyp was found in the transverse colon. The polyp was sessile.       The polyp was removed with a cold snare. Resection and retrieval were       complete.      The exam was otherwise without abnormality. Impression:               - Non-bleeding internal hemorrhoids.                           - One 4 mm polyp in the transverse colon, removed                            with a cold snare. Resected and retrieved.                           - The examination was otherwise normal. Moderate Sedation:      Per Anesthesia Care Recommendation:           -  Patient has a contact number available for                            emergencies. The signs and symptoms of potential                            delayed complications were discussed with the                            patient. Return to normal activities tomorrow.                            Written discharge instructions were provided to the                            patient.                           - Resume previous diet.                           - Continue present medications.                           - Await pathology results.                           - Repeat colonoscopy in 5 years for surveillance.                            - Return to GI clinic PRN. Procedure Code(s):        --- Professional ---                           [phone], Colonoscopy, flexible; with removal of                            tumor(s), polyp(s), or other lesion(s) by snare                            technique Diagnosis Code(s):        --- Professional ---                           Z12.11, Encounter for screening for malignant                            neoplasm of colon                           K63.5, Polyp of colon                           K64.8, Other hemorrhoids CPT copyright 2019 American Medical Association. All rights reserved. The codes documented in this report are preliminary and upon coder review may  be revised to meet current compliance requirements. Elon Alas. Abbey Chatters, DO Juanda Crumble  Arlee Muslim, DO 10/31/2021 10:22:05 AM This report has been signed electronically. Number of Addenda: 0

## 2021-10-31 NOTE — H&P (Signed)
Primary Care Physician:  Sharilyn Sites, MD Primary Gastroenterologist:  Dr. Abbey Chatters  Pre-Procedure History & Physical: HPI:  Chelsey Johnson Payment is a 71 y.o. female is here for a colonoscopy for colon cancer screening purposes.  Patient denies any family history of colorectal cancer.  No melena or hematochezia.  No abdominal pain or unintentional weight loss.  No change in bowel habits.  Overall feels well from a GI standpoint.  Past Medical History:  Diagnosis Date   Allergy    Anesthesia complication    tachycardia   Breast cancer ([place])    Complication of anesthesia    blood pressure drops quickly with anesthetic   HTN (hypertension)    Obesity     Past Surgical History:  Procedure Laterality Date   ABDOMINAL HYSTERECTOMY  1986   complete, uterine fibroids   BREAST BIOPSY Right    negative   COLONOSCOPY  09/19/2011   Procedure: COLONOSCOPY;  Surgeon: Chelsey Binder, MD;  Location: [facility] END[facility] SUITE;  Service: Endoscopy;  Laterality: N/A;  12:30   PARTIAL MASTECTOMY WITH NEEDLE LOCALIZATION Right 03/09/2019   Procedure: PARTIAL MASTECTOMY WITH NEEDLE LOCALIZATION;  Surgeon: Chelsey Signs, MD;  Location: [facility] ORS;  Service: General;  Laterality: Right;    Prior to Admission medications   Medication Sig Start Date End Date Taking? Authorizing Provider  anastrozole (ARIMIDEX) 1 MG tablet Take 1 tablet (1 mg total) by mouth daily. 08/27/21  Yes Chelsey Jack, MD  aspirin EC 81 MG tablet Take 81 mg by mouth every other day.   Yes [provider]  celecoxib (CELEBREX) 200 MG capsule Take 200 mg by mouth daily as needed for moderate pain.   Yes [provider]  Cholecalciferol (VITAMIN D3 PO) Take 1 tablet by mouth daily.   Yes [provider]  diphenhydrAMINE-zinc acetate (BENADRYL) cream Apply 1 Application topically 3 (three) times daily as needed for itching.   Yes [provider]  furosemide (LASIX) 20 MG tablet Take 20 mg by mouth daily as needed  for fluid or edema.   Yes [provider]  losartan-hydrochlorothiazide (HYZAAR) 50-12.5 MG tablet Take 1 tablet by mouth daily.   Yes [provider]  MAGNESIUM PO Take 1 tablet by mouth every other day.   Yes [provider]  Menthol, Topical Analgesic, (BIOFREEZE) 10 % CREA Apply 1 Application topically daily as needed (arthritis pain).   Yes [provider]  OMEGA 3 1200 MG CAPS Take 1,200 mg by mouth every other day.   Yes [provider]    Allergies as of 10/03/2021 - Review Complete 08/27/2021  Allergen Reaction Noted   Anesthetics, amide Palpitations 09/11/2011   Tylox [oxycodone-acetaminophen] Palpitations 09/11/2011    Family History  Problem Relation Age of Onset   Ulcers Brother    Colon polyps Mother 6   Congestive Heart Failure Mother    Lymphoma Father    Hypertension Sister    Breast cancer Sister    Hypertension Sister    Breast cancer Sister    Hypertension Sister    Hypertension Sister     Social History   Socioeconomic History   Marital status: Single    Spouse name: Not on file   Number of children: 0   Years of education: Not on file   Highest education level: Not on file  Occupational History   Occupation: [employer]    Employer: [employer]  Tobacco Use   Smoking status: Former  Packs/day: 1.00    Years: 10.00    Total pack years: 10.00    Types: Cigarettes    Quit date: 09/11/1991    Years since quitting: 30.1   Smokeless tobacco: Never   Tobacco comments:    quit about 20 + years  Vaping Use   Vaping Use: Never used  Substance and Sexual Activity   Alcohol use: No   Drug use: No   Sexual activity: Not Currently    Birth control/protection: Surgical    Comment: hyst  Other Topics Concern   Not on file  Social History Narrative   Lives w/ mother   Social Determinants of Health   Financial Resource Strain: Low Risk  (08/13/2021)   Overall Financial  Resource Strain (CARDIA)    Difficulty of Paying Living Expenses: Not hard at all  Food Insecurity: No Food Insecurity (08/13/2021)   Hunger Vital Sign    Worried About Running Out of Food in the Last Year: Never true    Ran Out of Food in the Last Year: Never true  Transportation Needs: No Transportation Needs (08/13/2021)   PRAPARE - Hydrologist (Medical): No    Lack of Transportation (Non-Medical): No  Physical Activity: Sufficiently Active (08/13/2021)   Exercise Vital Sign    Days of Exercise per Week: 7 days    Minutes of Exercise per Session: 100 min  Stress: Stress Concern Present (08/13/2021)   [place]    Feeling of Stress : To some extent  Social Connections: Moderately Integrated (08/13/2021)   Social Connection and Isolation Panel [NHANES]    Frequency of Communication with Friends and Family: More than three times a week    Frequency of Social Gatherings with Friends and Family: Once a week    Attends Religious Services: More than 4 times per year    Active Member of Clubs or Organizations: Yes    Attends Archivist Meetings: More than 4 times per year    Marital Status: Never married  Intimate Partner Violence: Not At Risk (08/13/2021)   Humiliation, Afraid, Rape, and Kick questionnaire    Fear of Current or Ex-Partner: No    Emotionally Abused: No    Physically Abused: No    Sexually Abused: No    Review of Systems: See HPI, otherwise negative ROS  Physical Exam: Vital Johnson in last 24 hours: Temp:  [97.8 F (36.6 C)] 97.8 F (36.6 C) (08/03 0907) Pulse Rate:  [92] 92 (08/03 0907) Resp:  [20] 20 (08/03 0907) BP: (150)/(84) 150/84 (08/03 0907) SpO2:  [96 %] 96 % (08/03 0907) Weight:  [95.8 kg] 95.8 kg (08/03 0907)   General:   Alert,  Well-developed, well-nourished, pleasant and cooperative in NAD Head:  Normocephalic and atraumatic. Eyes:  Sclera clear,  no icterus.   Conjunctiva pink. Ears:  Normal auditory acuity. Nose:  No deformity, discharge,  or lesions. Mouth:  No deformity or lesions, dentition normal. Neck:  Supple; no masses or thyromegaly. Lungs:  Clear throughout to auscultation.   No wheezes, crackles, or rhonchi. No acute distress. Heart:  Regular rate and rhythm; no murmurs, clicks, rubs,  or gallops. Abdomen:  Soft, nontender and nondistended. No masses, hepatosplenomegaly or hernias noted. Normal bowel sounds, without guarding, and without rebound.   Msk:  Symmetrical without gross deformities. Normal posture. Extremities:  Without clubbing or edema. Neurologic:  Alert and  oriented x4;  grossly normal neurologically. Skin:  Intact without significant lesions or rashes. Cervical Nodes:  No significant cervical adenopathy. Psych:  Alert and cooperative. Normal mood and affect.  Impression/Plan: Chelsey Johnson is here for a colonoscopy to be performed for colon cancer screening purposes.  The risks of the procedure including infection, bleed, or perforation as well as benefits, limitations, alternatives and imponderables have been reviewed with the patient. Questions have been answered. All parties agreeable.

## 2021-11-01 LAB — SURGICAL PATHOLOGY

## 2021-11-01 NOTE — Anesthesia Postprocedure Evaluation (Signed)
Anesthesia Post Note  Patient: Chelsey Johnson  Procedure(s) Performed: COLONOSCOPY WITH PROPOFOL POLYPECTOMY  Patient location during evaluation: Phase II Anesthesia Type: General Level of consciousness: awake Pain management: pain level controlled Vital Signs Assessment: post-procedure vital signs reviewed and stable Respiratory status: spontaneous breathing and respiratory function stable Cardiovascular status: blood pressure returned to baseline and stable Postop Assessment: no headache and no apparent nausea or vomiting Anesthetic complications: no Comments: Late entry   No notable events documented.   Last Vitals:  Vitals:   10/31/21 0930 10/31/21 1019  BP: (!) 173/54 121/62  Pulse: 73 75  Resp: (!) 22 15  Temp:  36.6 C  SpO2: 99%     Last Pain:  Vitals:   10/31/21 1019  TempSrc: Oral  PainSc:                  Louann Sjogren

## 2021-11-06 ENCOUNTER — Encounter (HOSPITAL_COMMUNITY): Payer: Self-pay | Admitting: Internal Medicine

## 2021-11-11 DIAGNOSIS — C50911 Malignant neoplasm of unspecified site of right female breast: Secondary | ICD-10-CM | POA: Diagnosis not present

## 2021-11-11 DIAGNOSIS — E669 Obesity, unspecified: Secondary | ICD-10-CM | POA: Diagnosis not present

## 2021-11-11 DIAGNOSIS — R7309 Other abnormal glucose: Secondary | ICD-10-CM | POA: Diagnosis not present

## 2021-11-11 DIAGNOSIS — Z6841 Body Mass Index (BMI) 40.0 and over, adult: Secondary | ICD-10-CM | POA: Diagnosis not present

## 2021-11-11 DIAGNOSIS — H6240 Otitis externa in other diseases classified elsewhere, unspecified ear: Secondary | ICD-10-CM | POA: Diagnosis not present

## 2021-11-11 DIAGNOSIS — I10 Essential (primary) hypertension: Secondary | ICD-10-CM | POA: Diagnosis not present

## 2021-11-11 DIAGNOSIS — R600 Localized edema: Secondary | ICD-10-CM | POA: Diagnosis not present

## 2021-11-11 DIAGNOSIS — E559 Vitamin D deficiency, unspecified: Secondary | ICD-10-CM | POA: Diagnosis not present

## 2021-11-11 DIAGNOSIS — B369 Superficial mycosis, unspecified: Secondary | ICD-10-CM | POA: Diagnosis not present

## 2021-12-04 ENCOUNTER — Other Ambulatory Visit (HOSPITAL_COMMUNITY): Payer: Self-pay | Admitting: Internal Medicine

## 2021-12-04 ENCOUNTER — Other Ambulatory Visit: Payer: Self-pay | Admitting: Internal Medicine

## 2021-12-04 DIAGNOSIS — Z1331 Encounter for screening for depression: Secondary | ICD-10-CM | POA: Diagnosis not present

## 2021-12-04 DIAGNOSIS — E039 Hypothyroidism, unspecified: Secondary | ICD-10-CM | POA: Diagnosis not present

## 2021-12-04 DIAGNOSIS — Z0001 Encounter for general adult medical examination with abnormal findings: Secondary | ICD-10-CM | POA: Diagnosis not present

## 2021-12-04 DIAGNOSIS — G459 Transient cerebral ischemic attack, unspecified: Secondary | ICD-10-CM

## 2021-12-04 DIAGNOSIS — I10 Essential (primary) hypertension: Secondary | ICD-10-CM | POA: Diagnosis not present

## 2021-12-04 DIAGNOSIS — Z6841 Body Mass Index (BMI) 40.0 and over, adult: Secondary | ICD-10-CM | POA: Diagnosis not present

## 2021-12-04 DIAGNOSIS — D518 Other vitamin B12 deficiency anemias: Secondary | ICD-10-CM | POA: Diagnosis not present

## 2021-12-04 DIAGNOSIS — M1991 Primary osteoarthritis, unspecified site: Secondary | ICD-10-CM | POA: Diagnosis not present

## 2021-12-04 DIAGNOSIS — E559 Vitamin D deficiency, unspecified: Secondary | ICD-10-CM | POA: Diagnosis not present

## 2021-12-09 ENCOUNTER — Ambulatory Visit (HOSPITAL_COMMUNITY)
Admission: RE | Admit: 2021-12-09 | Discharge: 2021-12-09 | Disposition: A | Discharge status: Home / Self Care | Payer: Medicare PPO | Source: Ambulatory Visit | Attending: Internal Medicine | Admitting: Internal Medicine

## 2021-12-09 DIAGNOSIS — G459 Transient cerebral ischemic attack, unspecified: Secondary | ICD-10-CM | POA: Diagnosis not present

## 2021-12-09 DIAGNOSIS — I10 Essential (primary) hypertension: Secondary | ICD-10-CM | POA: Diagnosis not present

## 2021-12-09 DIAGNOSIS — I6523 Occlusion and stenosis of bilateral carotid arteries: Secondary | ICD-10-CM | POA: Diagnosis not present

## 2022-02-04 DIAGNOSIS — L821 Other seborrheic keratosis: Secondary | ICD-10-CM | POA: Diagnosis not present

## 2022-02-04 DIAGNOSIS — D220 Melanocytic nevi of lip: Secondary | ICD-10-CM | POA: Diagnosis not present

## 2022-02-04 DIAGNOSIS — D225 Melanocytic nevi of trunk: Secondary | ICD-10-CM | POA: Diagnosis not present

## 2022-02-04 DIAGNOSIS — D485 Neoplasm of uncertain behavior of skin: Secondary | ICD-10-CM | POA: Diagnosis not present

## 2022-02-21 ENCOUNTER — Encounter (HOSPITAL_COMMUNITY): Payer: Self-pay

## 2022-02-21 ENCOUNTER — Ambulatory Visit (HOSPITAL_COMMUNITY)
Admission: RE | Admit: 2022-02-21 | Discharge: 2022-02-21 | Disposition: A | Discharge status: Home / Self Care | Payer: Medicare PPO | Source: Ambulatory Visit | Attending: Hematology | Admitting: Hematology

## 2022-02-21 DIAGNOSIS — Z1231 Encounter for screening mammogram for malignant neoplasm of breast: Secondary | ICD-10-CM | POA: Insufficient documentation

## 2022-02-21 DIAGNOSIS — D0511 Intraductal carcinoma in situ of right breast: Secondary | ICD-10-CM | POA: Diagnosis not present

## 2022-02-25 ENCOUNTER — Inpatient Hospital Stay: Payer: Medicare PPO | Attending: Hematology

## 2022-02-25 DIAGNOSIS — D0511 Intraductal carcinoma in situ of right breast: Secondary | ICD-10-CM | POA: Insufficient documentation

## 2022-02-25 DIAGNOSIS — Z79811 Long term (current) use of aromatase inhibitors: Secondary | ICD-10-CM | POA: Diagnosis not present

## 2022-02-25 LAB — CBC WITH DIFFERENTIAL/PLATELET
Abs Immature Granulocytes: 0.01 10*3/uL (ref 0.00–0.07)
Basophils Absolute: 0 10*3/uL (ref 0.0–0.1)
Basophils Relative: 0 %
Eosinophils Absolute: 0.1 10*3/uL (ref 0.0–0.5)
Eosinophils Relative: 4 %
HCT: 38.7 % (ref 36.0–46.0)
Hemoglobin: 12.4 g/dL (ref 12.0–15.0)
Immature Granulocytes: 0 %
Lymphocytes Relative: 27 %
Lymphs Abs: 0.9 10*3/uL (ref 0.7–4.0)
MCH: 28.1 pg (ref 26.0–34.0)
MCHC: 32 g/dL (ref 30.0–36.0)
MCV: 87.8 fL (ref 80.0–100.0)
Monocytes Absolute: 0.4 10*3/uL (ref 0.1–1.0)
Monocytes Relative: 12 %
Neutro Abs: 2 10*3/uL (ref 1.7–7.7)
Neutrophils Relative %: 57 %
Platelets: 173 10*3/uL (ref 150–400)
RBC: 4.41 MIL/uL (ref 3.87–5.11)
RDW: 13.2 % (ref 11.5–15.5)
WBC: 3.5 10*3/uL — ABNORMAL LOW (ref 4.0–10.5)
nRBC: 0 % (ref 0.0–0.2)

## 2022-02-25 LAB — VITAMIN D 25 HYDROXY (VIT D DEFICIENCY, FRACTURES): Vit D, 25-Hydroxy: 41.39 ng/mL (ref 30–100)

## 2022-02-25 LAB — COMPREHENSIVE METABOLIC PANEL
ALT: 19 U/L (ref 0–44)
AST: 20 U/L (ref 15–41)
Albumin: 3.8 g/dL (ref 3.5–5.0)
Alkaline Phosphatase: 59 U/L (ref 38–126)
Anion gap: 7 (ref 5–15)
BUN: 13 mg/dL (ref 8–23)
CO2: 27 mmol/L (ref 22–32)
Calcium: 9.6 mg/dL (ref 8.9–10.3)
Chloride: 104 mmol/L (ref 98–111)
Creatinine, Ser: 0.63 mg/dL (ref 0.44–1.00)
GFR, Estimated: >60 mL/min (ref >60)
Glucose, Bld: 97 mg/dL (ref 70–99)
Potassium: 3.8 mmol/L (ref 3.5–5.1)
Sodium: 138 mmol/L (ref 135–145)
Total Bilirubin: 0.6 mg/dL (ref 0.3–1.2)
Total Protein: 6.5 g/dL (ref 6.5–8.1)

## 2022-03-02 NOTE — Progress Notes (Unsigned)
[place], [place] [address]   CLINIC:  Medical Oncology/Hematology  PCP:  Sharilyn Sites, [place] / [place] [address] [phone]   REASON FOR VISIT:  Follow-up for history of right breast DCIS  PRIOR THERAPY: Right partial mastectomy (03/09/2019) + radiation therapy completed in [facility] (February 2021)  CURRENT THERAPY: Anastrozole since February 2021  BRIEF ONCOLOGIC HISTORY:  Oncology History  Ductal carcinoma in situ (DCIS) of right breast  03/09/2019 Initial Diagnosis   Ductal carcinoma in situ (DCIS) of right breast   04/05/2019 Cancer Staging   Staging form: Breast, AJCC 8th Edition - Clinical stage from 04/05/2019: Stage 0 (cTis (DCIS), cN0, cM0, ER+, PR+, HER2: Not Assessed) - Signed by Derek Jack, MD on 04/05/2019     CANCER STAGING: Cancer Staging  Ductal carcinoma in situ (DCIS) of right breast Staging form: Breast, AJCC 8th Edition - Clinical stage from 04/05/2019: Stage 0 (cTis (DCIS), cN0, cM0, ER+, PR+, HER2: Not Assessed) - Signed by Derek Jack, MD on 04/05/2019 - Pathologic: No stage assigned - Unsigned   INTERVAL HISTORY:  [name], a 71 y.o. female, returns for routine follow-up of her history of right breast DCIS. Kaylany was last seen on 08/27/2021 by Dr. Delton Coombes.   At today's visit, she  reports feeling fairly well.  She denies any recent hospitalizations, surgeries, or changes in her  baseline health status.  Most recent mammogram on 02/21/2022 showed no mammographic evidence of malignancy.  She denies any symptoms of recurrence such as new lumps, bone pain, chest pain, dyspnea, or abdominal pain.   She has no new headaches, seizures, or focal neurologic deficits.  No B symptoms such as fever, chills, night sweats, unintentional weight loss.  She continues to take anastrozole, and is tolerating it fairly well with occasional hot flashes and no significant  arthralgias.  She is taking vitamin D, but no calcium.  She reports 100% energy and 100% appetite.  She is maintaining stable weight at this time.    REVIEW OF SYSTEMS:  Patient denies any complaints at today's visit. Review of Systems  Constitutional:  Negative for appetite change, chills, diaphoresis, fatigue, fever and unexpected weight change.  HENT:   Negative for lump/mass and nosebleeds.   Eyes:  Negative for eye problems.  Respiratory:  Negative for cough, hemoptysis and shortness of breath.   Cardiovascular:  Negative for chest pain, leg swelling and palpitations.  Gastrointestinal:  Negative for abdominal pain, blood in stool, constipation, diarrhea, nausea and vomiting.  Genitourinary:  Negative for hematuria.   Skin: Negative.   Neurological:  Negative for dizziness, headaches and light-headedness.  Hematological:  Does not bruise/bleed easily.    PAST MEDICAL/SURGICAL HISTORY:  Past Medical History:  Diagnosis Date   Allergy    Anesthesia complication    tachycardia   Breast cancer ([place])    Complication of anesthesia    blood pressure drops quickly with anesthetic   HTN (hypertension)    Obesity    Past Surgical History:  Procedure Laterality Date   ABDOMINAL HYSTERECTOMY  1986   complete, uterine fibroids   BREAST BIOPSY Right    negative   COLONOSCOPY  09/19/2011   Procedure: COLONOSCOPY;  Surgeon: Danie Binder, MD;  [facility];  Service: Endoscopy;  Laterality: N/A;  12:30   COLONOSCOPY WITH PROPOFOL N/A 10/31/2021   Procedure: COLONOSCOPY WITH PROPOFOL;  Surgeon: Eloise Harman, DO;  Location: [facility];  Service: Endoscopy;  Laterality: N/A;  950   PARTIAL MASTECTOMY WITH NEEDLE LOCALIZATION Right 03/09/2019   Procedure: PARTIAL MASTECTOMY WITH NEEDLE LOCALIZATION;  Surgeon: Aviva Signs, MD;  Location: [facility];  Service: General;  Laterality: Right;   POLYPECTOMY  10/31/2021   Procedure: POLYPECTOMY;  Surgeon: Eloise Harman, DO;   Location: [facility];  Service: Endoscopy;;    SOCIAL HISTORY:  Social History   Socioeconomic History   Marital status: Single    Spouse name: Not on file   Number of children: 0   Years of education: Not on file   Highest education level: Not on file  Occupational History   Occupation: [employer]    Employer: [employer]  Tobacco Use   Smoking status: Former    Packs/day: 1.00    Years: 10.00    Total pack years: 10.00    Types: Cigarettes    Quit date: 09/11/1991    Years since quitting: 30.4   Smokeless tobacco: Never   Tobacco comments:    quit about 20 + years  Vaping Use   Vaping Use: Never used  Substance and Sexual Activity   Alcohol use: No   Drug use: No   Sexual activity: Not Currently    Birth control/protection: Surgical    Comment: hyst  Other Topics Concern   Not on file  Social History Narrative   Lives w/ mother   Social Determinants of Health   Financial Resource Strain: Low Risk  (08/13/2021)   Overall Financial Resource Strain (CARDIA)    Difficulty of Paying Living Expenses: Not hard at all  Food Insecurity: No Food Insecurity (08/13/2021)   Hunger Vital Sign    Worried About Running Out of Food in the Last Year: Never true    Ran Out of Food in the Last Year: Never true  Transportation Needs: No Transportation Needs (08/13/2021)   PRAPARE - Hydrologist (Medical): No    Lack of Transportation (Non-Medical): No  Physical Activity: Sufficiently Active (08/13/2021)   Exercise Vital Sign    Days of Exercise per Week: 7 days    Minutes of Exercise per Session: 100 min  Stress: Stress Concern Present (08/13/2021)   [place]    Feeling of Stress : To some extent  Social Connections: Moderately Integrated (08/13/2021)   Social Connection and Isolation Panel [NHANES]    Frequency of Communication with Friends and  Family: More than three times a week    Frequency of Social Gatherings with Friends and Family: Once a week    Attends Religious Services: More than 4 times per year    Active Member of [employer] or Organizations: Yes    Attends Archivist Meetings: More than 4 times per year    Marital Status: Never married  Intimate Partner Violence: Not At Risk (08/13/2021)   Humiliation, Afraid, Rape, and Kick questionnaire    Fear of Current or Ex-Partner: No    Emotionally Abused: No    Physically Abused: No    Sexually Abused: No    FAMILY HISTORY:  Family History  Problem Relation Age of Onset   Ulcers Brother    Colon polyps Mother 12   Congestive Heart Failure Mother    Lymphoma Father    Hypertension Sister    Breast cancer Sister    Hypertension Sister    Breast cancer Sister    Hypertension Sister  Hypertension Sister     CURRENT MEDICATIONS:  Current Outpatient Medications  Medication Sig Dispense Refill   anastrozole (ARIMIDEX) 1 MG tablet Take 1 tablet (1 mg total) by mouth daily. 90 tablet 3   aspirin EC 81 MG tablet Take 81 mg by mouth every other day.     celecoxib (CELEBREX) 200 MG capsule Take 200 mg by mouth daily as needed for moderate pain.     Cholecalciferol (VITAMIN D3 PO) Take 1 tablet by mouth daily.     diphenhydrAMINE-zinc acetate (BENADRYL) cream Apply 1 Application topically 3 (three) times daily as needed for itching.     furosemide (LASIX) 20 MG tablet Take 20 mg by mouth daily as needed for fluid or edema.     losartan-hydrochlorothiazide (HYZAAR) 50-12.5 MG tablet Take 1 tablet by mouth daily.     MAGNESIUM PO Take 1 tablet by mouth every other day.     Menthol, Topical Analgesic, (BIOFREEZE) 10 % CREA Apply 1 Application topically daily as needed (arthritis pain).     OMEGA 3 1200 MG CAPS Take 1,200 mg by mouth every other day.     No current facility-administered medications for this visit.    ALLERGIES:  Allergies  Allergen Reactions    Anesthetics, Amide Palpitations    Pt reports Tachycardia w/ HRs 170's with General Anesthesia but cannot give details regarding names of medications.   Tylox [Oxycodone-Acetaminophen] Palpitations    Tachycardia     PHYSICAL EXAM: Performance status (ECOG): 0 - Asymptomatic  There were no vitals filed for this visit. Wt Readings from Last 3 Encounters:  10/31/21 211 lb 3.2 oz (95.8 kg)  10/29/21 211 lb 3.2 oz (95.8 kg)  08/27/21 211 lb 3.2 oz (95.8 kg)   Physical Exam Constitutional:      Appearance: Normal appearance. She is obese.  HENT:     Head: Normocephalic and atraumatic.     Mouth/Throat:     Mouth: Mucous membranes are moist.  Eyes:     Extraocular Movements: Extraocular movements intact.     Pupils: Pupils are equal, round, and reactive to light.  Cardiovascular:     Rate and Rhythm: Normal rate and regular rhythm.     Pulses: Normal pulses.     Heart sounds: Normal heart sounds.  Pulmonary:     Effort: Pulmonary effort is normal.     Breath sounds: Normal breath sounds.  Chest:  Breasts:    Right: Tenderness present.       Comments: Dense breast tissue bilaterally.  Right breast tenderness with lymphedema in lower outer quadrant.  No discrete nodules, masses, or lymphadenopathy in either breast. Abdominal:     General: Bowel sounds are normal.     Palpations: Abdomen is soft.     Tenderness: There is no abdominal tenderness.  Musculoskeletal:        General: No swelling.     Right lower leg: No edema.     Left lower leg: No edema.  Lymphadenopathy:     Cervical: No cervical adenopathy.     Upper Body:     Right upper body: No supraclavicular, axillary or pectoral adenopathy.     Left upper body: No supraclavicular, axillary or pectoral adenopathy.  Skin:    General: Skin is warm and dry.  Neurological:     General: No focal deficit present.     Mental Status: She is alert and oriented to person, place, and time.  Psychiatric:  Mood and  Affect: Mood normal.        Behavior: Behavior normal.      LABORATORY DATA:  I have reviewed the labs as listed.     Latest Ref Rng & Units 02/25/2022    1:52 PM 08/20/2021    1:40 PM 02/19/2021   11:49 AM  CBC  WBC 4.0 - 10.5 K/uL 3.5  3.5  4.3   Hemoglobin 12.0 - 15.0 g/dL 12.4  12.7  12.8   Hematocrit 36.0 - 46.0 % 38.7  39.8  39.8   Platelets 150 - 400 K/uL 173  187  172       Latest Ref Rng & Units 02/25/2022    1:52 PM 08/20/2021    1:40 PM 02/19/2021   11:49 AM  CMP  Glucose 70 - 99 mg/dL 97  96  106   BUN 8 - 23 mg/dL _0 Creatinine 0.44 - 1.00 mg/dL 0.63  0.64  0.66   Sodium 135 - 145 mmol/L 138  140  139   Potassium 3.5 - 5.1 mmol/L 3.8  4.2  4.4   Chloride 98 - 111 mmol/L 104  108  104   CO2 22 - 32 mmol/L _1 Calcium 8.9 - 10.3 mg/dL 9.6  10.1  10.0   Total Protein 6.5 - 8.1 g/dL 6.5  6.8  6.7   Total Bilirubin 0.3 - 1.2 mg/dL 0.6  0.4  0.8   Alkaline Phos 38 - 126 U/L 59  70  73   AST 15 - 41 U/L _2 ALT 0 - 44 U/L _3 DIAGNOSTIC IMAGING:  I have independently reviewed the scans and discussed with the patient. MM 3D SCREEN BREAST BILATERAL  Result Date: 02/24/2022 CLINICAL DATA:  Screening. EXAM: DIGITAL SCREENING BILATERAL MAMMOGRAM WITH TOMOSYNTHESIS AND CAD TECHNIQUE: Bilateral screening digital craniocaudal and mediolateral oblique mammograms were obtained. Bilateral screening digital breast tomosynthesis was performed. The images were evaluated with computer-aided detection. COMPARISON:  Previous exam(s). ACR Breast Density Category b: There are scattered areas of fibroglandular density. FINDINGS: There are no findings suspicious for malignancy. IMPRESSION: No mammographic evidence of malignancy. A result letter of this screening mammogram will be mailed directly to the patient. RECOMMENDATION: Screening mammogram in one year. (Code:SM-B-01Y) BI-RADS CATEGORY  1: Negative. Electronically Signed   By: Fidela Salisbury M.D.   On: 02/24/2022 15:42     ASSESSMENT & PLAN: 1.  Intermediate grade right breast DCIS (2020) - Screening mammogram on 01/31/2019 was B RADS category 0.  Additional views of the right breast showed intermediate 0.4 cm mass within the upper outer right breast which is new. - Right breast biopsy at 10 o'clock position on 02/15/2019 consistent with DCIS, intermediate grade, ER/PR positive. - Right partial mastectomy on 03/09/2019 showing 0.6 cm involving an intraductal papilloma, intermediate grade, in situ carcinoma less than 1 mm from inferior margin, not on ink.  Other margins negative.  Right inferior margin excision shows low-grade DCIS, 0.6 cm.  Not completely clear if it is a 2 foci or DCIS. - Radiation therapy completed in [place] in February 2021. - She has been on anastrozole since February 2021.  She is tolerating it well with occasional hot flashes and no significant arthralgias.   - Most recent mammogram (02/21/2022) BI-RADS Category 1, no mammographic evidence of malignancy - Physical exam today shows dense  breast tissue bilaterally.  Right breast lumpectomy scar unchanged.  Right breast tenderness with lymphedema in lower outer quadrant.  No discrete nodules, masses, or lymphadenopathy in either breast. - Patient history/ROS today negative for any "red flag" symptoms of recurrence - Most recent labs (02/25/2022) show normal LFTs and unremarkable CBC. - PLAN: Labs in 6 months (CBC/D, CMP, vitamin D) with office visit 1 week after. - Next mammogram will be due November 2024  2.  Bone density - DEXA scan on 04/15/2019 with T-score -0.7 - She is taking vitamin D and daily. - Most recent vitamin D (02/17/2022) normal at 41.39 - PLAN: Order bone density/DEXA scan to be completed prior to her follow-up visit in 6 months.   -- Continue Vitamin D.  Rx sent to start calcium.  3.  Family history - Patient has 2 sisters with breast cancer - Patient's father had lymphoma -  Patient's brother had prostate cancer - Patient's niece had ovarian cancer - Paternal aunt had stomach cancer - PLAN: Patient declined referral to genetic counselor - she [place] snot have children and reports she will just "pray for the best" in regards to her own health   PLAN SUMMARY:  >> Labs in 6 months (CBC/D, CMP, vitamin D) >> DEXA/bone density scan in 6 months >> Office visit 1 week after labs/bone density scan   All questions were answered. The patient knows to call the clinic with any problems, questions or concerns.  Medical decision making: Moderate  Time spent on visit: I spent 20 minutes counseling the patient face to face. The total time spent in the appointment was 30 minutes and more than 50% was on counseling.   Harriett Rush, PA-C  03/03/22 4:56 PM

## 2022-03-03 ENCOUNTER — Other Ambulatory Visit: Payer: Self-pay

## 2022-03-03 ENCOUNTER — Inpatient Hospital Stay: Payer: Medicare PPO | Attending: Physician Assistant | Admitting: Physician Assistant

## 2022-03-03 VITALS — BP 147/75 | HR 85 | Temp 97.9°F | Resp 18 | Wt 215.4 lb

## 2022-03-03 DIAGNOSIS — D0511 Intraductal carcinoma in situ of right breast: Secondary | ICD-10-CM | POA: Insufficient documentation

## 2022-03-03 DIAGNOSIS — M8580 Other specified disorders of bone density and structure, unspecified site: Secondary | ICD-10-CM | POA: Diagnosis not present

## 2022-03-03 DIAGNOSIS — Z79811 Long term (current) use of aromatase inhibitors: Secondary | ICD-10-CM | POA: Diagnosis not present

## 2022-03-03 DIAGNOSIS — Z923 Personal history of irradiation: Secondary | ICD-10-CM | POA: Insufficient documentation

## 2022-03-03 MED ORDER — CALCIUM 500 MG PO TABS: 500.0000 mg | ORAL_TABLET | Freq: Every day | ORAL | 3 refills | Status: AC

## 2022-03-03 NOTE — Patient Instructions (Signed)
Chelsey Johnson at [facility] **   You were seen today by Tarri Abernethy PA-C for your history of right-sided breast cancer.  Your most recent mammogram, labs, and physical exam did not show any signs of recurrent breast cancer.  We will schedule you for LABS and BONE DENSITY SCAN in 6 months.  We will see you for follow-up visit in 6 months.  MEDICATIONS: - Continue taking Arimidex (anastrozole) once daily. - Continue taking vitamin D. - START taking calcium supplement once daily.   ** Thank you for trusting me with your healthcare!  I strive to provide all of my patients with quality care at each visit.  If you receive a survey for this visit, I would be so grateful to you for taking the time to provide feedback.  Thank you in advance!  ~ [name]                   Dr. Derek Jack   &   Tarri Abernethy, PA-C   - - - - - - - - - - - - - - - - - -    Thank you for choosing [place] at [facility] to provide your oncology and hematology care.  To afford each patient quality time with our provider, please arrive at least 15 minutes before your scheduled appointment time.   If you have a lab appointment with the [facility] please come in thru the Main Entrance and check in at the main information desk.  You need to re-schedule your appointment should you arrive 10 or more minutes late.  We strive to give you quality time with our providers, and arriving late affects you and other patients whose appointments are after yours.  Also, if you no show three or more times for appointments you may be dismissed from the clinic at the providers discretion.     Again, thank you for choosing [facility].  Our hope is that these requests will decrease the amount of time that you wait before being seen by our physicians.        _____________________________________________________________  Should you have questions after your visit to [facility], please contact our office at [phone] and follow the prompts.  Our office hours are 8:00 a.m. and 4:30 p.m. Monday - Friday.  Please note that voicemails left after 4:00 p.m. may not be returned until the following business day.  We are closed weekends and major holidays.  You do have access to a nurse 24-7, just call the main number to the clinic [phone] and do not press any options, hold on the line and a nurse will answer the phone.    For prescription refill requests, have your pharmacy contact our office and allow 72 hours.

## 2022-03-27 DIAGNOSIS — Z923 Personal history of irradiation: Secondary | ICD-10-CM | POA: Diagnosis not present

## 2022-03-27 DIAGNOSIS — Z79811 Long term (current) use of aromatase inhibitors: Secondary | ICD-10-CM | POA: Diagnosis not present

## 2022-03-27 DIAGNOSIS — D0511 Intraductal carcinoma in situ of right breast: Secondary | ICD-10-CM | POA: Diagnosis not present

## 2022-08-15 ENCOUNTER — Other Ambulatory Visit: Payer: Self-pay | Admitting: Hematology

## 2022-08-15 DIAGNOSIS — D0511 Intraductal carcinoma in situ of right breast: Secondary | ICD-10-CM

## 2022-08-18 ENCOUNTER — Other Ambulatory Visit: Payer: Self-pay | Admitting: *Deleted

## 2022-08-18 DIAGNOSIS — D0511 Intraductal carcinoma in situ of right breast: Secondary | ICD-10-CM

## 2022-08-18 MED ORDER — ANASTROZOLE 1 MG PO TABS
1.0000 mg | ORAL_TABLET | Freq: Every day | ORAL | 3 refills | Status: DC
Start: 2022-08-18 — End: 2023-08-25

## 2022-08-18 NOTE — Telephone Encounter (Signed)
Refill approved for Anastrozole.  Patient is tolerating and is to continue therapy. 

## 2022-09-03 ENCOUNTER — Inpatient Hospital Stay: Payer: Medicare PPO | Attending: Physician Assistant

## 2022-09-03 ENCOUNTER — Ambulatory Visit (HOSPITAL_COMMUNITY): Payer: Medicare PPO

## 2022-09-03 DIAGNOSIS — Z79811 Long term (current) use of aromatase inhibitors: Secondary | ICD-10-CM | POA: Diagnosis not present

## 2022-09-03 DIAGNOSIS — I890 Lymphedema, not elsewhere classified: Secondary | ICD-10-CM | POA: Insufficient documentation

## 2022-09-03 DIAGNOSIS — D0511 Intraductal carcinoma in situ of right breast: Secondary | ICD-10-CM | POA: Diagnosis not present

## 2022-09-03 DIAGNOSIS — Z807 Family history of other malignant neoplasms of lymphoid, hematopoietic and related tissues: Secondary | ICD-10-CM | POA: Diagnosis not present

## 2022-09-03 DIAGNOSIS — Z923 Personal history of irradiation: Secondary | ICD-10-CM | POA: Insufficient documentation

## 2022-09-03 DIAGNOSIS — Z803 Family history of malignant neoplasm of breast: Secondary | ICD-10-CM | POA: Insufficient documentation

## 2022-09-03 DIAGNOSIS — Z800 Family history of malignant neoplasm of digestive organs: Secondary | ICD-10-CM | POA: Insufficient documentation

## 2022-09-03 DIAGNOSIS — Z9011 Acquired absence of right breast and nipple: Secondary | ICD-10-CM | POA: Insufficient documentation

## 2022-09-03 DIAGNOSIS — Z87891 Personal history of nicotine dependence: Secondary | ICD-10-CM | POA: Insufficient documentation

## 2022-09-03 DIAGNOSIS — Z8041 Family history of malignant neoplasm of ovary: Secondary | ICD-10-CM | POA: Insufficient documentation

## 2022-09-03 DIAGNOSIS — M8580 Other specified disorders of bone density and structure, unspecified site: Secondary | ICD-10-CM

## 2022-09-03 LAB — COMPREHENSIVE METABOLIC PANEL
ALT: 18 U/L (ref 0–44)
AST: 20 U/L (ref 15–41)
Albumin: 3.9 g/dL (ref 3.5–5.0)
Alkaline Phosphatase: 60 U/L (ref 38–126)
Anion gap: 7 (ref 5–15)
BUN: 18 mg/dL (ref 8–23)
CO2: 27 mmol/L (ref 22–32)
Calcium: 9.8 mg/dL (ref 8.9–10.3)
Chloride: 103 mmol/L (ref 98–111)
Creatinine, Ser: 0.7 mg/dL (ref 0.44–1.00)
GFR, Estimated: >60 mL/min (ref >60)
Glucose, Bld: 98 mg/dL (ref 70–99)
Potassium: 4.1 mmol/L (ref 3.5–5.1)
Sodium: 137 mmol/L (ref 135–145)
Total Bilirubin: 0.7 mg/dL (ref 0.3–1.2)
Total Protein: 7 g/dL (ref 6.5–8.1)

## 2022-09-03 LAB — CBC WITH DIFFERENTIAL/PLATELET
Abs Immature Granulocytes: 0.01 10*3/uL (ref 0.00–0.07)
Basophils Absolute: 0 10*3/uL (ref 0.0–0.1)
Basophils Relative: 0 %
Eosinophils Absolute: 0.2 10*3/uL (ref 0.0–0.5)
Eosinophils Relative: 5 %
HCT: 40 % (ref 36.0–46.0)
Hemoglobin: 13 g/dL (ref 12.0–15.0)
Immature Granulocytes: 0 %
Lymphocytes Relative: 28 %
Lymphs Abs: 1.3 10*3/uL (ref 0.7–4.0)
MCH: 28.4 pg (ref 26.0–34.0)
MCHC: 32.5 g/dL (ref 30.0–36.0)
MCV: 87.3 fL (ref 80.0–100.0)
Monocytes Absolute: 0.6 10*3/uL (ref 0.1–1.0)
Monocytes Relative: 14 %
Neutro Abs: 2.4 10*3/uL (ref 1.7–7.7)
Neutrophils Relative %: 53 %
Platelets: 173 10*3/uL (ref 150–400)
RBC: 4.58 MIL/uL (ref 3.87–5.11)
RDW: 13.3 % (ref 11.5–15.5)
WBC: 4.5 10*3/uL (ref 4.0–10.5)
nRBC: 0 % (ref 0.0–0.2)

## 2022-09-03 LAB — VITAMIN D 25 HYDROXY (VIT D DEFICIENCY, FRACTURES): Vit D, 25-Hydroxy: 32.52 ng/mL (ref 30–100)

## 2022-09-09 NOTE — Progress Notes (Unsigned)
[facility] [address], [place] [address]   CLINIC:  Medical Oncology/Hematology  PCP:  Assunta Found, MD [address] / [place] [address] [phone]   REASON FOR VISIT:  Follow-up for history of right breast DCIS   PRIOR THERAPY: Right partial mastectomy (03/09/2019) + radiation therapy completed in [facility] (February 2021)   CURRENT THERAPY: Anastrozole since February 2021  BRIEF ONCOLOGIC HISTORY:   Oncology History  Ductal carcinoma in situ (DCIS) of right breast  03/09/2019 Initial Diagnosis   Ductal carcinoma in situ (DCIS) of right breast   04/05/2019 Cancer Staging   Staging form: Breast, AJCC 8th Edition - Clinical stage from 04/05/2019: Stage 0 (cTis (DCIS), cN0, cM0, ER+, PR+, HER2: Not Assessed) - Signed by Doreatha Massed, MD on 04/05/2019     CANCER STAGING: Cancer Staging  Ductal carcinoma in situ (DCIS) of right breast Staging form: Breast, AJCC 8th Edition - Clinical stage from 04/05/2019: Stage 0 (cTis (DCIS), cN0, cM0, ER+, PR+, HER2: Not Assessed) - Signed by Doreatha Massed, MD on 04/05/2019 - Pathologic: No stage assigned - Unsigned   INTERVAL HISTORY:   [name], a 72 y.o. female, returns for routine follow-up of her history of right breast DCIS. Chelsey Johnson was last seen on 03/03/2022 by Rojelio Brenner PA-C.   At today's visit, she  reports feeling fairly well.  She denies any recent hospitalizations, surgeries, or changes in her  baseline health status.  She denies any symptoms of recurrence such as new lumps, bone pain, chest pain, dyspnea, or abdominal pain.   She has no new headaches, seizures, or focal neurologic deficits.  No B symptoms such as fever, chills, night sweats, unintentional weight loss.   She continues to take anastrozole, and is tolerating it fairly well with occasional hot flashes and no significant arthralgias.  She is taking vitamin D, but no calcium.  She reports 80% energy  and 100% appetite.  She is maintaining stable weight at this time.   ASSESSMENT & PLAN:  1.  Intermediate grade RIGHT breast DCIS (2020) - Screening mammogram on 01/31/2019 was B RADS category 0.  Additional views of the right breast showed intermediate 0.4 cm mass within the upper outer right breast which is new. - Right breast biopsy at 10 o'clock position on 02/15/2019 consistent with DCIS, intermediate grade, ER/PR positive. - Right partial mastectomy on 03/09/2019 showing 0.6 cm involving an intraductal papilloma, intermediate grade, in situ carcinoma less than 1 mm from inferior margin, not on ink.  Other margins negative.  Right inferior margin excision shows low-grade DCIS, 0.6 cm.  Not completely clear if it is a 2 foci or DCIS. - Radiation therapy completed in [place] in February 2021. - She has been on anastrozole since February 2021.  She is tolerating it well with occasional hot flashes and no significant arthralgias. - Most recent mammogram (02/21/2022) BI-RADS Category 1, no mammographic evidence of malignancy - On exam today, she had an area of tissue thickening and nodularity at the inferior margin of the right breast at approximately 6 o'clock position.  This was not noted on previous exam.  Otherwise, she has unchanged right breast lymphedema in lower outer quadrant and right breast upper outer quadrant scar unchanged.  She has tenderness in the left breast as well.  No nodules or masses in left breast.  No axillary, pectoral, or epitrochlear lymphadenopathy bilaterally. - Patient history/ROS today negative for any "red flag" symptoms of recurrence - Most recent  labs (09/03/2022) show normal LFTs and unremarkable CBC. - Bilateral breast tenderness likely related to anastrozole. - PLAN: Check diagnostic mammogram of right breast. - Labs in 6 months (CBC/D, CMP, vitamin D) with office visit 1 week after. -Since she has completed 4 years of antiestrogen therapy, we will send tissue  sample for BCI testing. - Next screening mammogram will be due November 2024.  We will hold off on scheduling this until after her diagnostic right breast mammogram has been performed.   2.  Bone density - DEXA scan on 04/15/2019 with T-score - 0.7 - She is taking vitamin D daily. - Most recent labs (09/03/2022): Normal vitamin D 32.52, calcium 9.8 - PLAN: Reschedule bone density/DEXA scan that was missed in June 2024 - Continue vitamin D and weightbearing exercises  -- Recommend starting OTC calcium 500 mg daily   3.  Family history - Patient has 2 sisters with breast cancer - Patient's father had lymphoma - Patient's brother had prostate cancer - Patient's niece had ovarian cancer - Paternal aunt had stomach cancer - PLAN: Patient declined referral to genetic counselor - she does not have children and reports she will just "pray for the best" in regards to her own health  PLAN SUMMARY: >> Diagnostic mammogram of right breast (this month) >> Bone density scan (this month) >> Labs in 6 months = CBC/D, CMP, vitamin D >> OFFICE visit in 6 months (after labs)  ** Watch for results of DIAGNOSTIC MAMMO and DEXA and call patient with results and any changes to plan.    REVIEW OF SYSTEMS:   Review of Systems  Constitutional:  Negative for appetite change, chills, diaphoresis, fatigue, fever and unexpected weight change.  HENT:   Negative for lump/mass and nosebleeds.   Eyes:  Negative for eye problems.  Respiratory:  Positive for cough. Negative for hemoptysis and shortness of breath.   Cardiovascular:  Negative for chest pain, leg swelling and palpitations.  Gastrointestinal:  Positive for nausea. Negative for abdominal pain, blood in stool, constipation, diarrhea and vomiting.  Genitourinary:  Positive for frequency. Negative for hematuria.   Skin: Negative.   Neurological:  Positive for dizziness and numbness. Negative for headaches and light-headedness.  Hematological:  Does not  bruise/bleed easily.    PHYSICAL EXAM:   Performance status (ECOG): 1 - Symptomatic but completely ambulatory  There were no vitals filed for this visit. Wt Readings from Last 3 Encounters:  03/03/22 215 lb 6.2 oz (97.7 kg)  10/31/21 211 lb 3.2 oz (95.8 kg)  10/29/21 211 lb 3.2 oz (95.8 kg)   Physical Exam Constitutional:      Appearance: Normal appearance. She is obese.  HENT:     Head: Normocephalic and atraumatic.     Mouth/Throat:     Mouth: Mucous membranes are moist.  Eyes:     Extraocular Movements: Extraocular movements intact.     Pupils: Pupils are equal, round, and reactive to light.  Cardiovascular:     Rate and Rhythm: Normal rate and regular rhythm.     Pulses: Normal pulses.     Heart sounds: Normal heart sounds.  Pulmonary:     Effort: Pulmonary effort is normal.     Breath sounds: Normal breath sounds.  Chest:  Breasts:    Right: Swelling and tenderness present.     Left: Tenderness present.       Comments: - Area of tissue thickening and nodularity at the inferior margin of the right breast at approximately 6 o'clock  position.  This was not noted on previous exam.   - Unchanged right breast lymphedema in lower outer quadrant and right breast upper outer quadrant scar unchanged.  - Tenderness in the left breast as well.  No nodules or masses in left breast.  - No axillary, pectoral, or epitrochlear lymphadenopathy bilaterally. Abdominal:     General: Bowel sounds are normal.     Palpations: Abdomen is soft.     Tenderness: There is no abdominal tenderness.  Musculoskeletal:        General: No swelling.     Right lower leg: No edema.     Left lower leg: No edema.  Lymphadenopathy:     Cervical: No cervical adenopathy.     Upper Body:     Right upper body: No supraclavicular, axillary or pectoral adenopathy.     Left upper body: No supraclavicular, axillary or pectoral adenopathy.  Skin:    General: Skin is warm and dry.  Neurological:      General: No focal deficit present.     Mental Status: She is alert and oriented to person, place, and time.  Psychiatric:        Mood and Affect: Mood normal.        Behavior: Behavior normal.      PAST MEDICAL/SURGICAL HISTORY:  Past Medical History:  Diagnosis Date   Allergy    Anesthesia complication    tachycardia   Breast cancer (HCC)    Complication of anesthesia    blood pressure drops quickly with anesthetic   HTN (hypertension)    Obesity    Past Surgical History:  Procedure Laterality Date   ABDOMINAL HYSTERECTOMY  1986   complete, uterine fibroids   BREAST BIOPSY Right    negative   COLONOSCOPY  09/19/2011   Procedure: COLONOSCOPY;  Surgeon: West Bali, MD;  Location: [facility];  Service: Endoscopy;  Laterality: N/A;  12:30   COLONOSCOPY WITH PROPOFOL N/A 10/31/2021   Procedure: COLONOSCOPY WITH PROPOFOL;  Surgeon: Lanelle Bal, DO;  Location: [facility];  Service: Endoscopy;  Laterality: N/A;  950   PARTIAL MASTECTOMY WITH NEEDLE LOCALIZATION Right 03/09/2019   Procedure: PARTIAL MASTECTOMY WITH NEEDLE LOCALIZATION;  Surgeon: Franky Macho, MD;  Location: [facility];  Service: General;  Laterality: Right;   POLYPECTOMY  10/31/2021   Procedure: POLYPECTOMY;  Surgeon: Lanelle Bal, DO;  Location: [facility];  Service: Endoscopy;;    SOCIAL HISTORY:  Social History   Socioeconomic History   Marital status: Single    Spouse name: Not on file   Number of children: 0   Years of education: Not on file   Highest education level: Not on file  Occupational History   Occupation: [employer]    Employer: [employer]  Tobacco Use   Smoking status: Former    Packs/day: 1.00    Years: 10.00    Additional pack years: 0.00    Total pack years: 10.00    Types: Cigarettes    Quit date: 09/11/1991    Years since quitting: 31.0   Smokeless tobacco: Never   Tobacco comments:    quit about 20 + years  Vaping Use    Vaping Use: Never used  Substance and Sexual Activity   Alcohol use: No   Drug use: No   Sexual activity: Not Currently    Birth control/protection: Surgical    Comment: hyst  Other Topics Concern   Not on file  Social History Narrative   Lives w/ mother   Social Determinants of Health   Financial Resource Strain: Low Risk  (08/13/2021)   Overall Financial Resource Strain (CARDIA)    Difficulty of Paying Living Expenses: Not hard at all  Food Insecurity: No Food Insecurity (08/13/2021)   Hunger Vital Sign    Worried About Running Out of Food in the Last Year: Never true    Ran Out of Food in the Last Year: Never true  Transportation Needs: No Transportation Needs (08/13/2021)   PRAPARE - Administrator, Civil Service (Medical): No    Lack of Transportation (Non-Medical): No  Physical Activity: Sufficiently Active (08/13/2021)   Exercise Vital Sign    Days of Exercise per Week: 7 days    Minutes of Exercise per Session: 100 min  Stress: Stress Concern Present (08/13/2021)   [employer] of Occupational Health - Occupational Stress Questionnaire    Feeling of Stress : To some extent  Social Connections: Moderately Integrated (08/13/2021)   Social Connection and Isolation Panel [NHANES]    Frequency of Communication with Friends and Family: More than three times a week    Frequency of Social Gatherings with Friends and Family: Once a week    Attends Religious Services: More than 4 times per year    Active Member of [employer] or Organizations: Yes    Attends Engineer, structural: More than 4 times per year    Marital Status: Never married  Intimate Partner Violence: Not At Risk (08/13/2021)   Humiliation, Afraid, Rape, and Kick questionnaire    Fear of Current or Ex-Partner: No    Emotionally Abused: No    Physically Abused: No    Sexually Abused: No    FAMILY HISTORY:  Family History  Problem Relation Age of Onset   Ulcers Brother    Colon polyps Mother  104   Congestive Heart Failure Mother    Lymphoma Father    Hypertension Sister    Breast cancer Sister    Hypertension Sister    Breast cancer Sister    Hypertension Sister    Hypertension Sister     CURRENT MEDICATIONS:  Current Outpatient Medications  Medication Sig Dispense Refill   anastrozole (ARIMIDEX) 1 MG tablet Take 1 tablet (1 mg total) by mouth daily. 90 tablet 3   aspirin EC 81 MG tablet Take 81 mg by mouth every other day.     Calcium 500 MG tablet Take 1 tablet (500 mg total) by mouth daily. 90 tablet 3   Cholecalciferol (VITAMIN D3 PO) Take 1 tablet by mouth daily.     clopidogrel (PLAVIX) 75 MG tablet Take 75 mg by mouth daily.     diphenhydrAMINE-zinc acetate (BENADRYL) cream Apply 1 Application topically 3 (three) times daily as needed for itching. (Patient not taking: Reported on 03/03/2022)     furosemide (LASIX) 20 MG tablet Take 20 mg by mouth daily as needed for fluid or edema.     losartan-hydrochlorothiazide (HYZAAR) 50-12.5 MG tablet Take 1 tablet by mouth daily.     Menthol, Topical Analgesic, (BIOFREEZE) 10 % CREA Apply 1 Application topically daily as needed (arthritis pain).     OMEGA 3 1200 MG CAPS Take 1,200 mg by mouth every other day.     rosuvastatin (CRESTOR) 10 MG tablet Take 10 mg by mouth daily.     No current facility-administered medications for this visit.    ALLERGIES:  Allergies  Allergen Reactions   Anesthetics,  Amide Palpitations    Pt reports Tachycardia w/ HRs 170's with General Anesthesia but cannot give details regarding names of medications.   Tylox [Oxycodone-Acetaminophen] Palpitations    Tachycardia     LABORATORY DATA:  I have reviewed the labs as listed.     Latest Ref Rng & Units 09/03/2022    1:10 PM 02/25/2022    1:52 PM 08/20/2021    1:40 PM  CBC  WBC 4.0 - 10.5 K/uL 4.5  3.5  3.5   Hemoglobin 12.0 - 15.0 g/dL 16.1  09.6  04.5   Hematocrit 36.0 - 46.0 % 40.0  38.7  39.8   Platelets 150 - 400 K/uL 173  173  187        Latest Ref Rng & Units 09/03/2022    1:10 PM 02/25/2022    1:52 PM 08/20/2021    1:40 PM  CMP  Glucose 70 - 99 mg/dL 98  97  96   BUN 8 - 23 mg/dL 18  13  14    Creatinine 0.44 - 1.00 mg/dL 4.09  8.11  9.14   Sodium 135 - 145 mmol/L 137  138  140   Potassium 3.5 - 5.1 mmol/L 4.1  3.8  4.2   Chloride 98 - 111 mmol/L 103  104  108   CO2 22 - 32 mmol/L 27  27  29    Calcium 8.9 - 10.3 mg/dL 9.8  9.6  78.2   Total Protein 6.5 - 8.1 g/dL 7.0  6.5  6.8   Total Bilirubin 0.3 - 1.2 mg/dL 0.7  0.6  0.4   Alkaline Phos 38 - 126 U/L 60  59  70   AST 15 - 41 U/L 20  20  20    ALT 0 - 44 U/L 18  19  18      DIAGNOSTIC IMAGING:  I have independently reviewed the scans and discussed with the patient. No results found.   WRAP UP:  All questions were answered. The patient knows to call the clinic with any problems, questions or concerns.  Medical decision making: Moderate  Time spent on visit: I spent 20 minutes counseling the patient face to face. The total time spent in the appointment was 30 minutes and more than 50% was on counseling.  Carnella Guadalajara, PA-C  09/10/2022 2:04 PM

## 2022-09-10 ENCOUNTER — Other Ambulatory Visit (HOSPITAL_COMMUNITY): Payer: Self-pay | Admitting: Physician Assistant

## 2022-09-10 ENCOUNTER — Inpatient Hospital Stay (HOSPITAL_BASED_OUTPATIENT_CLINIC_OR_DEPARTMENT_OTHER): Payer: Medicare PPO | Admitting: Physician Assistant

## 2022-09-10 VITALS — BP 136/81 | HR 84 | Temp 98.4°F | Resp 16 | Wt 208.3 lb

## 2022-09-10 DIAGNOSIS — D0511 Intraductal carcinoma in situ of right breast: Secondary | ICD-10-CM

## 2022-09-10 DIAGNOSIS — Z9011 Acquired absence of right breast and nipple: Secondary | ICD-10-CM | POA: Diagnosis not present

## 2022-09-10 DIAGNOSIS — Z807 Family history of other malignant neoplasms of lymphoid, hematopoietic and related tissues: Secondary | ICD-10-CM | POA: Diagnosis not present

## 2022-09-10 DIAGNOSIS — Z87891 Personal history of nicotine dependence: Secondary | ICD-10-CM | POA: Diagnosis not present

## 2022-09-10 DIAGNOSIS — Z803 Family history of malignant neoplasm of breast: Secondary | ICD-10-CM | POA: Diagnosis not present

## 2022-09-10 DIAGNOSIS — Z8041 Family history of malignant neoplasm of ovary: Secondary | ICD-10-CM | POA: Diagnosis not present

## 2022-09-10 DIAGNOSIS — Z79811 Long term (current) use of aromatase inhibitors: Secondary | ICD-10-CM | POA: Diagnosis not present

## 2022-09-10 DIAGNOSIS — I890 Lymphedema, not elsewhere classified: Secondary | ICD-10-CM | POA: Diagnosis not present

## 2022-09-10 DIAGNOSIS — Z923 Personal history of irradiation: Secondary | ICD-10-CM | POA: Diagnosis not present

## 2022-09-10 NOTE — Patient Instructions (Signed)
[facility] **VISIT SUMMARY & IMPORTANT INSTRUCTIONS **   You were seen today by Rojelio Brenner PA-C for your history of right-sided breast cancer.   Your labs look great! You did have an area of lumpy tissue on your breast exam.  This is most likely scar tissue from your previous surgery, but we will check a mammogram of your right breast to make sure you do not have any new changes.  TESTS: - Mammogram right breast - Bone density scan  MEDICATIONS: - Continue taking anastrozole (breast cancer pill) daily - Continue taking daily vitamin D supplement. - START taking over-the-counter calcium supplement 500 mg daily  FOLLOW-UP APPOINTMENT: Office visit in 6 months  ** Thank you for trusting me with your healthcare!  I strive to provide all of my patients with quality care at each visit.  If you receive a survey for this visit, I would be so grateful to you for taking the time to provide feedback.  Thank you in advance!  ~ Britiney Blahnik                   Dr. Doreatha Massed   &   Rojelio Brenner, PA-C   - - - - - - - - - - - - - - - - - -    Thank you for choosing [facility] at [facility] to provide your oncology and hematology care.  To afford each patient quality time with our provider, please arrive at least 15 minutes before your scheduled appointment time.   If you have a lab appointment with the Cancer Center please come in thru the Main Entrance and check in at the main information desk.  You need to re-schedule your appointment should you arrive 10 or more minutes late.  We strive to give you quality time with our providers, and arriving late affects you and other patients whose appointments are after yours.  Also, if you no show three or more times for appointments you may be dismissed from the clinic at the providers discretion.     Again, thank you for choosing [facility].  Our hope is that these  requests will decrease the amount of time that you wait before being seen by our physicians.       _____________________________________________________________  Should you have questions after your visit to [facility], please contact our office at [phone] and follow the prompts.  Our office hours are 8:00 a.m. and 4:30 p.m. Monday - Friday.  Please note that voicemails left after 4:00 p.m. may not be returned until the following business day.  We are closed weekends and major holidays.  You do have access to a nurse 24-7, just call the main number to the clinic [phone] and do not press any options, hold on the line and a nurse will answer the phone.    For prescription refill requests, have your pharmacy contact our office and allow 72 hours.

## 2022-09-23 ENCOUNTER — Ambulatory Visit (HOSPITAL_COMMUNITY)
Admission: RE | Admit: 2022-09-23 | Discharge: 2022-09-23 | Disposition: A | Discharge status: Home / Self Care | Payer: Medicare PPO | Source: Ambulatory Visit | Attending: Physician Assistant | Admitting: Physician Assistant

## 2022-09-23 ENCOUNTER — Encounter (HOSPITAL_COMMUNITY): Payer: Self-pay

## 2022-09-23 DIAGNOSIS — Z780 Asymptomatic menopausal state: Secondary | ICD-10-CM | POA: Diagnosis not present

## 2022-09-23 DIAGNOSIS — D0511 Intraductal carcinoma in situ of right breast: Secondary | ICD-10-CM | POA: Diagnosis not present

## 2022-09-23 DIAGNOSIS — M8580 Other specified disorders of bone density and structure, unspecified site: Secondary | ICD-10-CM

## 2022-09-23 DIAGNOSIS — N6489 Other specified disorders of breast: Secondary | ICD-10-CM | POA: Diagnosis not present

## 2022-09-23 DIAGNOSIS — Z853 Personal history of malignant neoplasm of breast: Secondary | ICD-10-CM | POA: Diagnosis not present

## 2022-09-23 DIAGNOSIS — M8589 Other specified disorders of bone density and structure, multiple sites: Secondary | ICD-10-CM | POA: Insufficient documentation

## 2022-09-23 DIAGNOSIS — R92321 Mammographic fibroglandular density, right breast: Secondary | ICD-10-CM | POA: Diagnosis not present

## 2022-10-01 ENCOUNTER — Inpatient Hospital Stay: Payer: Medicare PPO | Admitting: Physician Assistant

## 2022-10-01 ENCOUNTER — Other Ambulatory Visit: Payer: Self-pay | Admitting: Physician Assistant

## 2022-10-01 ENCOUNTER — Encounter: Payer: Self-pay | Admitting: Physician Assistant

## 2022-10-01 DIAGNOSIS — Z1231 Encounter for screening mammogram for malignant neoplasm of breast: Secondary | ICD-10-CM

## 2022-10-01 DIAGNOSIS — D0511 Intraductal carcinoma in situ of right breast: Secondary | ICD-10-CM

## 2022-10-01 NOTE — Progress Notes (Addendum)
BONE DENSITY SCAN (09/23/2022) showed significantly worsened bone density, with T-score -2.3 (which is decreased significantly compared to bone density scan from 04/15/2019 which showed T-score -0.7).  Patient was contacted regarding these results and we scheduled her for a phone visit to discuss various pharmacologic treatment options for her worsening osteopenia while on aromatase inhibitor.  Plan was to discuss risks and benefits of Prolia and proceed with Prolia injections if patient was agreeable.  However, patient called and canceled that appointment, requesting that we instead discussed this with her at her follow-up visit that is scheduled for December 2024.  Diagnostic mammogram from 09/23/2022 was negative for any evidence of malignancy.  We will go ahead and order her annual screening mammography, which is due in November 2024.  Carnella Guadalajara, PA-C  10/01/22 9:03 AM

## 2022-10-29 ENCOUNTER — Other Ambulatory Visit: Payer: Self-pay

## 2022-10-29 ENCOUNTER — Encounter (HOSPITAL_COMMUNITY): Payer: Self-pay

## 2022-10-29 ENCOUNTER — Emergency Department (HOSPITAL_COMMUNITY)
Admission: EM | Admit: 2022-10-29 | Discharge: 2022-10-29 | Disposition: A | Discharge status: Home / Self Care | Payer: Medicare PPO | Attending: Emergency Medicine | Admitting: Emergency Medicine

## 2022-10-29 DIAGNOSIS — Z7902 Long term (current) use of antithrombotics/antiplatelets: Secondary | ICD-10-CM | POA: Diagnosis not present

## 2022-10-29 DIAGNOSIS — Z7982 Long term (current) use of aspirin: Secondary | ICD-10-CM | POA: Diagnosis not present

## 2022-10-29 DIAGNOSIS — R1084 Generalized abdominal pain: Secondary | ICD-10-CM | POA: Diagnosis not present

## 2022-10-29 DIAGNOSIS — R112 Nausea with vomiting, unspecified: Secondary | ICD-10-CM | POA: Insufficient documentation

## 2022-10-29 LAB — URINALYSIS, ROUTINE W REFLEX MICROSCOPIC
Bacteria, UA: NONE SEEN
Bilirubin Urine: NEGATIVE
Glucose, UA: NEGATIVE mg/dL
Hgb urine dipstick: NEGATIVE
Ketones, ur: NEGATIVE mg/dL
Leukocytes,Ua: NEGATIVE
Nitrite: NEGATIVE
Protein, ur: 30 mg/dL — AB
Specific Gravity, Urine: 1.025 (ref 1.005–1.030)
pH: 6 (ref 5.0–8.0)

## 2022-10-29 LAB — CBC WITH DIFFERENTIAL/PLATELET
Abs Immature Granulocytes: 0.01 10*3/uL (ref 0.00–0.07)
Basophils Absolute: 0 10*3/uL (ref 0.0–0.1)
Basophils Relative: 0 %
Eosinophils Absolute: 0.1 10*3/uL (ref 0.0–0.5)
Eosinophils Relative: 2 %
HCT: 42.3 % (ref 36.0–46.0)
Hemoglobin: 13.6 g/dL (ref 12.0–15.0)
Immature Granulocytes: 0 %
Lymphocytes Relative: 20 %
Lymphs Abs: 1.1 10*3/uL (ref 0.7–4.0)
MCH: 28.3 pg (ref 26.0–34.0)
MCHC: 32.2 g/dL (ref 30.0–36.0)
MCV: 88.1 fL (ref 80.0–100.0)
Monocytes Absolute: 0.4 10*3/uL (ref 0.1–1.0)
Monocytes Relative: 8 %
Neutro Abs: 3.8 10*3/uL (ref 1.7–7.7)
Neutrophils Relative %: 70 %
Platelets: 155 10*3/uL (ref 150–400)
RBC: 4.8 MIL/uL (ref 3.87–5.11)
RDW: 13 % (ref 11.5–15.5)
WBC: 5.4 10*3/uL (ref 4.0–10.5)
nRBC: 0 % (ref 0.0–0.2)

## 2022-10-29 LAB — COMPREHENSIVE METABOLIC PANEL
ALT: 21 U/L (ref 0–44)
AST: 25 U/L (ref 15–41)
Albumin: 4.2 g/dL (ref 3.5–5.0)
Alkaline Phosphatase: 69 U/L (ref 38–126)
Anion gap: 6 (ref 5–15)
BUN: 19 mg/dL (ref 8–23)
CO2: 27 mmol/L (ref 22–32)
Calcium: 9.6 mg/dL (ref 8.9–10.3)
Chloride: 103 mmol/L (ref 98–111)
Creatinine, Ser: 0.78 mg/dL (ref 0.44–1.00)
GFR, Estimated: >60 mL/min (ref >60)
Glucose, Bld: 148 mg/dL — ABNORMAL HIGH (ref 70–99)
Potassium: 3.8 mmol/L (ref 3.5–5.1)
Sodium: 136 mmol/L (ref 135–145)
Total Bilirubin: 0.6 mg/dL (ref 0.3–1.2)
Total Protein: 7.4 g/dL (ref 6.5–8.1)

## 2022-10-29 LAB — LIPASE, BLOOD: Lipase: 35 U/L (ref 11–51)

## 2022-10-29 MED ORDER — ONDANSETRON HCL 4 MG/2ML IJ SOLN
4.0000 mg | Freq: Once | INTRAMUSCULAR | Status: AC
  Administered 2022-10-29: 4 mg via INTRAVENOUS
  Filled 2022-10-29: qty 2

## 2022-10-29 MED ORDER — SODIUM CHLORIDE 0.9 % IV BOLUS
500.0000 mL | Freq: Once | INTRAVENOUS | Status: AC
  Administered 2022-10-29: 500 mL via INTRAVENOUS

## 2022-10-29 MED ORDER — ONDANSETRON 4 MG PO TBDP: ORAL_TABLET | ORAL | 0 refills | Status: DC

## 2022-10-29 NOTE — ED Provider Notes (Signed)
[place] EMERGENCY DEPARTMENT AT [facility] Provider Note   CSN: [id number] Arrival date & time: 10/29/22  4098     History  Chief Complaint  Patient presents with   Emesis    Chelsey Johnson is a 72 y.o. female.  Presents to the department for evaluation of nausea, vomiting, generalized abdominal pain.  Patient reports symptoms began overnight.  She noted multiple times, is feeling very weak.  Patient reports that the abdominal pain has now resolved.  No diarrhea.  No fever.       Home Medications Prior to Admission medications   Medication Sig Start Date End Date Taking? Authorizing Provider  ondansetron (ZOFRAN-ODT) 4 MG disintegrating tablet 4mg  ODT q4 hours prn nausea/vomit 10/29/22  Yes Kacper Cartlidge, Canary Brim, MD  anastrozole (ARIMIDEX) 1 MG tablet Take 1 tablet (1 mg total) by mouth daily. 08/18/22   Doreatha Massed, MD  aspirin EC 81 MG tablet Take 81 mg by mouth every other day.    [provider]  Calcium 500 MG tablet Take 1 tablet (500 mg total) by mouth daily. 03/03/22   Carnella Guadalajara, PA-C  Cholecalciferol (VITAMIN D3 PO) Take 1 tablet by mouth daily.    [provider]  clopidogrel (PLAVIX) 75 MG tablet Take 75 mg by mouth daily. 02/23/22   [provider]  diphenhydrAMINE-zinc acetate (BENADRYL) cream Apply 1 Application topically 3 (three) times daily as needed for itching.    [provider]  furosemide (LASIX) 20 MG tablet Take 20 mg by mouth daily as needed for fluid or edema.    [provider]  losartan-hydrochlorothiazide (HYZAAR) 50-12.5 MG tablet Take 1 tablet by mouth daily.    [provider]  Menthol, Topical Analgesic, (BIOFREEZE) 10 % CREA Apply 1 Application topically daily as needed (arthritis pain).    [provider]  OMEGA 3 1200 MG CAPS Take 1,200 mg by mouth every other day.    [provider]  rosuvastatin (CRESTOR) 10 MG tablet Take 10 mg by  mouth daily. 12/16/21   [provider]      Allergies    Anesthetics, amide and Tylox [oxycodone-acetaminophen]    Review of Systems   Review of Systems  Physical Exam Updated Vital Signs BP 121/80   Pulse 86   Temp 97.7 F (36.5 C) (Oral)   Resp 17   SpO2 95%  Physical Exam Vitals and nursing note reviewed.  Constitutional:      General: She is not in acute distress.    Appearance: She is well-developed.  HENT:     Head: Normocephalic and atraumatic.     Mouth/Throat:     Mouth: Mucous membranes are moist.  Eyes:     General: Vision grossly intact. Gaze aligned appropriately.     Extraocular Movements: Extraocular movements intact.     Conjunctiva/sclera: Conjunctivae normal.  Cardiovascular:     Rate and Rhythm: Normal rate and regular rhythm.     Pulses: Normal pulses.     Heart sounds: Normal heart sounds, S1 normal and S2 normal. No murmur heard.    No friction rub. No gallop.  Pulmonary:     Effort: Pulmonary effort is normal. No respiratory distress.     Breath sounds: Normal breath sounds.  Abdominal:     General: Bowel sounds are normal.     Palpations: Abdomen is soft.     Tenderness: There is no abdominal tenderness. There is no guarding or rebound.  Hernia: No hernia is present.  Musculoskeletal:        General: No swelling.     Cervical back: Full passive range of motion without pain, normal range of motion and neck supple. No spinous process tenderness or muscular tenderness. Normal range of motion.     Right lower leg: No edema.     Left lower leg: No edema.  Skin:    General: Skin is warm and dry.     Capillary Refill: Capillary refill takes less than 2 seconds.     Findings: No ecchymosis, erythema, rash or wound.  Neurological:     General: No focal deficit present.     Mental Status: She is alert and oriented to person, place, and time.     GCS: GCS eye subscore is 4. GCS verbal subscore is 5. GCS motor subscore is 6.     Cranial  Nerves: Cranial nerves 2-12 are intact.     Sensory: Sensation is intact.     Motor: Motor function is intact.     Coordination: Coordination is intact.  Psychiatric:        Attention and Perception: Attention normal.        Mood and Affect: Mood normal.        Speech: Speech normal.        Behavior: Behavior normal.     ED Results / Procedures / Treatments   Labs (all labs ordered are listed, but only abnormal results are displayed) Labs Reviewed  COMPREHENSIVE METABOLIC PANEL - Abnormal; Notable for the following components:      Result Value   Glucose, Bld 148 (*)    All other components within normal limits  URINALYSIS, ROUTINE W REFLEX MICROSCOPIC - Abnormal; Notable for the following components:   Protein, ur 30 (*)    All other components within normal limits  CBC WITH DIFFERENTIAL/PLATELET  LIPASE, BLOOD    EKG None  Radiology No results found.  Procedures Procedures    Medications Ordered in ED Medications  sodium chloride 0.9 % bolus 500 mL (0 mLs Intravenous Stopped 10/29/22 0514)  ondansetron (ZOFRAN) injection 4 mg (4 mg Intravenous Given 10/29/22 0411)    ED Course/ Medical Decision Making/ A&P                                 Medical Decision Making Amount and/or Complexity of Data Reviewed Labs: ordered.  Risk Prescription drug management.   Presents to the emergency department for evaluation of abdominal pain with nausea and vomiting.  Symptoms began sometime this evening.  She vomited several times and is now feeling better.  Pain has resolved.  Abdominal exam is benign, no tenderness.  Lab work is reassuring.  Patient hydrated and administered Zofran.  She has continued to do well.  No further complaints.  Appropriate for discharge, symptomatic treatment.        Final Clinical Impression(s) / ED Diagnoses Final diagnoses:  Nausea and vomiting, unspecified vomiting type    Rx / DC Orders ED Discharge Orders          Ordered     ondansetron (ZOFRAN-ODT) 4 MG disintegrating tablet        10/29/22 0554              Gilda Crease, MD 10/29/22 [phone]

## 2022-10-29 NOTE — ED Notes (Signed)
Discharge instructions provided by edp were discussed with pt. Pt verbalized understanding with no additional questions at this time.   Per pt request, call pt's sister for ride home. In route, approx 20-30 min away

## 2022-10-29 NOTE — ED Triage Notes (Signed)
Pt c/o N/V and generalized abdominal pain since last night. Endorses weakness

## 2022-11-28 DIAGNOSIS — I10 Essential (primary) hypertension: Secondary | ICD-10-CM | POA: Diagnosis not present

## 2022-11-28 DIAGNOSIS — M1991 Primary osteoarthritis, unspecified site: Secondary | ICD-10-CM | POA: Diagnosis not present

## 2022-11-28 DIAGNOSIS — Z6839 Body mass index (BMI) 39.0-39.9, adult: Secondary | ICD-10-CM | POA: Diagnosis not present

## 2022-11-28 DIAGNOSIS — R059 Cough, unspecified: Secondary | ICD-10-CM | POA: Diagnosis not present

## 2022-11-28 DIAGNOSIS — Z20828 Contact with and (suspected) exposure to other viral communicable diseases: Secondary | ICD-10-CM | POA: Diagnosis not present

## 2022-11-28 DIAGNOSIS — E782 Mixed hyperlipidemia: Secondary | ICD-10-CM | POA: Diagnosis not present

## 2022-12-21 ENCOUNTER — Emergency Department (HOSPITAL_COMMUNITY)
Admission: EM | Admit: 2022-12-21 | Discharge: 2022-12-22 | Disposition: A | Discharge status: Home / Self Care | Payer: Medicare PPO | Attending: Emergency Medicine | Admitting: Emergency Medicine

## 2022-12-21 ENCOUNTER — Other Ambulatory Visit: Payer: Self-pay

## 2022-12-21 DIAGNOSIS — Z79899 Other long term (current) drug therapy: Secondary | ICD-10-CM | POA: Diagnosis not present

## 2022-12-21 DIAGNOSIS — Z7902 Long term (current) use of antithrombotics/antiplatelets: Secondary | ICD-10-CM | POA: Diagnosis not present

## 2022-12-21 DIAGNOSIS — R112 Nausea with vomiting, unspecified: Secondary | ICD-10-CM

## 2022-12-21 DIAGNOSIS — I10 Essential (primary) hypertension: Secondary | ICD-10-CM | POA: Insufficient documentation

## 2022-12-21 DIAGNOSIS — K439 Ventral hernia without obstruction or gangrene: Secondary | ICD-10-CM | POA: Diagnosis not present

## 2022-12-21 DIAGNOSIS — K429 Umbilical hernia without obstruction or gangrene: Secondary | ICD-10-CM | POA: Diagnosis not present

## 2022-12-21 DIAGNOSIS — R1033 Periumbilical pain: Secondary | ICD-10-CM | POA: Diagnosis present

## 2022-12-21 DIAGNOSIS — Z90710 Acquired absence of both cervix and uterus: Secondary | ICD-10-CM | POA: Diagnosis not present

## 2022-12-21 DIAGNOSIS — Z7982 Long term (current) use of aspirin: Secondary | ICD-10-CM | POA: Insufficient documentation

## 2022-12-21 NOTE — ED Triage Notes (Signed)
Pt to ed pov with c/o n/v without diarrhea and fever. Pt states started around 2100 this Sunday evening. Family states intermittent vomiting has been going on with stomach pains since August.

## 2022-12-22 ENCOUNTER — Emergency Department (HOSPITAL_COMMUNITY): Payer: Medicare PPO

## 2022-12-22 ENCOUNTER — Encounter (HOSPITAL_COMMUNITY): Payer: Self-pay | Admitting: Emergency Medicine

## 2022-12-22 DIAGNOSIS — K429 Umbilical hernia without obstruction or gangrene: Secondary | ICD-10-CM | POA: Diagnosis not present

## 2022-12-22 DIAGNOSIS — Z90710 Acquired absence of both cervix and uterus: Secondary | ICD-10-CM | POA: Diagnosis not present

## 2022-12-22 DIAGNOSIS — K439 Ventral hernia without obstruction or gangrene: Secondary | ICD-10-CM | POA: Diagnosis not present

## 2022-12-22 LAB — COMPREHENSIVE METABOLIC PANEL
ALT: 21 U/L (ref 0–44)
AST: 25 U/L (ref 15–41)
Albumin: 3.9 g/dL (ref 3.5–5.0)
Alkaline Phosphatase: 59 U/L (ref 38–126)
Anion gap: 10 (ref 5–15)
BUN: 14 mg/dL (ref 8–23)
CO2: 26 mmol/L (ref 22–32)
Calcium: 9.9 mg/dL (ref 8.9–10.3)
Chloride: 101 mmol/L (ref 98–111)
Creatinine, Ser: 0.7 mg/dL (ref 0.44–1.00)
GFR, Estimated: >60 mL/min (ref >60)
Glucose, Bld: 161 mg/dL — ABNORMAL HIGH (ref 70–99)
Potassium: 3.8 mmol/L (ref 3.5–5.1)
Sodium: 137 mmol/L (ref 135–145)
Total Bilirubin: 0.5 mg/dL (ref 0.3–1.2)
Total Protein: 7 g/dL (ref 6.5–8.1)

## 2022-12-22 LAB — CBC WITH DIFFERENTIAL/PLATELET
Abs Immature Granulocytes: 0.02 10*3/uL (ref 0.00–0.07)
Basophils Absolute: 0 10*3/uL (ref 0.0–0.1)
Basophils Relative: 0 %
Eosinophils Absolute: 0.1 10*3/uL (ref 0.0–0.5)
Eosinophils Relative: 1 %
HCT: 38.7 % (ref 36.0–46.0)
Hemoglobin: 12.7 g/dL (ref 12.0–15.0)
Immature Granulocytes: 0 %
Lymphocytes Relative: 12 %
Lymphs Abs: 0.7 10*3/uL (ref 0.7–4.0)
MCH: 28.5 pg (ref 26.0–34.0)
MCHC: 32.8 g/dL (ref 30.0–36.0)
MCV: 87 fL (ref 80.0–100.0)
Monocytes Absolute: 0.4 10*3/uL (ref 0.1–1.0)
Monocytes Relative: 7 %
Neutro Abs: 4.5 10*3/uL (ref 1.7–7.7)
Neutrophils Relative %: 80 %
Platelets: 148 10*3/uL — ABNORMAL LOW (ref 150–400)
RBC: 4.45 MIL/uL (ref 3.87–5.11)
RDW: 13.3 % (ref 11.5–15.5)
WBC: 5.7 10*3/uL (ref 4.0–10.5)
nRBC: 0 % (ref 0.0–0.2)

## 2022-12-22 LAB — URINALYSIS, ROUTINE W REFLEX MICROSCOPIC
Bilirubin Urine: NEGATIVE
Glucose, UA: NEGATIVE mg/dL
Hgb urine dipstick: NEGATIVE
Ketones, ur: NEGATIVE mg/dL
Leukocytes,Ua: NEGATIVE
Nitrite: NEGATIVE
Protein, ur: NEGATIVE mg/dL
Specific Gravity, Urine: >1.046 — ABNORMAL HIGH (ref 1.005–1.030)
pH: 6 (ref 5.0–8.0)

## 2022-12-22 LAB — LIPASE, BLOOD: Lipase: 30 U/L (ref 11–51)

## 2022-12-22 MED ORDER — PROCHLORPERAZINE 25 MG RE SUPP: 25.0000 mg | Freq: Three times a day (TID) | RECTAL | 0 refills | Status: DC | PRN

## 2022-12-22 MED ORDER — ONDANSETRON HCL 4 MG/2ML IJ SOLN
4.0000 mg | Freq: Once | INTRAMUSCULAR | Status: AC | PRN
  Administered 2022-12-22: 4 mg via INTRAVENOUS
  Filled 2022-12-22: qty 2

## 2022-12-22 MED ORDER — DIPHENHYDRAMINE HCL 50 MG/ML IJ SOLN
25.0000 mg | Freq: Once | INTRAMUSCULAR | Status: AC
  Administered 2022-12-22: 25 mg via INTRAVENOUS
  Filled 2022-12-22: qty 1

## 2022-12-22 MED ORDER — IOHEXOL 300 MG/ML  SOLN
100.0000 mL | Freq: Once | INTRAMUSCULAR | Status: AC | PRN
  Administered 2022-12-22: 100 mL via INTRAVENOUS

## 2022-12-22 NOTE — Discharge Instructions (Signed)
Return if you have any episodes of severe abdominal pain.

## 2022-12-22 NOTE — ED Notes (Signed)
Pt care taken at this time, just came back from ct complaining of itching.

## 2022-12-22 NOTE — ED Provider Notes (Signed)
[name] ([facility]) Provider Note   CSN: [id number] Arrival date & time: 12/21/22  2327     History  Chief Complaint  Patient presents with   Abdominal Pain    Chelsey Johnson is a 72 y.o. female.  The history is provided by the patient and a relative.  Abdominal Pain She has history of hypertension and comes in because of vomiting since 9 PM tonight.  When she gets ready to throw up, she does have some periumbilical pain.  She denies diarrhea.  She denies fever or chills.  She has had several similar episodes over the last 2-3 months.  She sometimes notices swelling in her lower abdomen which can be as big as an orange.  In between episodes of emesis, she feels fine.  She is generally weak now, and usually feels weak during these episodes, but feels fine in between episodes.  She does not does have history of hysterectomy.   Home Medications Prior to Admission medications   Medication Sig Start Date End Date Taking? Authorizing Provider  anastrozole (ARIMIDEX) 1 MG tablet Take 1 tablet (1 mg total) by mouth daily. 08/18/22   Doreatha Massed, MD  aspirin EC 81 MG tablet Take 81 mg by mouth every other day.    [provider]  Calcium 500 MG tablet Take 1 tablet (500 mg total) by mouth daily. 03/03/22   Carnella Guadalajara, PA-C  Cholecalciferol (VITAMIN D3 PO) Take 1 tablet by mouth daily.    [provider]  clopidogrel (PLAVIX) 75 MG tablet Take 75 mg by mouth daily. 02/23/22   [provider]  diphenhydrAMINE-zinc acetate (BENADRYL) cream Apply 1 Application topically 3 (three) times daily as needed for itching.    [provider]  furosemide (LASIX) 20 MG tablet Take 20 mg by mouth daily as needed for fluid or edema.    [provider]  losartan-hydrochlorothiazide (HYZAAR) 50-12.5 MG tablet Take 1 tablet by mouth daily.    [provider]  Menthol, Topical Analgesic, (BIOFREEZE) 10 %  CREA Apply 1 Application topically daily as needed (arthritis pain).    [provider]  OMEGA 3 1200 MG CAPS Take 1,200 mg by mouth every other day.    [provider]  ondansetron (ZOFRAN-ODT) 4 MG disintegrating tablet 4mg  ODT q4 hours prn nausea/vomit 10/29/22   Pollina, Canary Brim, MD  rosuvastatin (CRESTOR) 10 MG tablet Take 10 mg by mouth daily. 12/16/21   [provider]      Allergies    Anesthetics, amide and Tylox [oxycodone-acetaminophen]    Review of Systems   Review of Systems  Gastrointestinal:  Positive for abdominal pain.  All other systems reviewed and are negative.   Physical Exam Updated Vital Signs BP (!) 182/95   Pulse 81   Temp 98 F (36.7 C) (Oral)   Resp 16   Ht 5\' 2"  (1.575 m)   Wt 95.7 kg   SpO2 95%   BMI 38.59 kg/m  Physical Exam Vitals and nursing note reviewed.   72 year old female, resting comfortably and in no acute distress. Vital signs are significant for elevated blood pressure. Oxygen saturation is 95%, which is normal. Head is normocephalic and atraumatic. PERRLA, EOMI. Oropharynx is clear. Neck is nontender and supple without adenopathy. Lungs are clear without rales, wheezes, or rhonchi. Chest is nontender. Heart has regular rate and rhythm without murmur. Abdomen is soft, flat, nontender.  Ventral hernia is palpable in  the lower abdomen in the midline, easily reducible. Extremities have 1+ edema, full range of motion is present. Skin is warm and dry without rash. Neurologic: Mental status is normal, cranial nerves are intact, moves all extremities equally.  ED Results / Procedures / Treatments   Labs (all labs ordered are listed, but only abnormal results are displayed) Labs Reviewed  COMPREHENSIVE METABOLIC PANEL - Abnormal; Notable for the following components:      Result Value   Glucose, Bld 161 (*)    All other components within normal limits  URINALYSIS, ROUTINE W REFLEX MICROSCOPIC - Abnormal;  Notable for the following components:   Specific Gravity, Urine >1.046 (*)    All other components within normal limits  CBC WITH DIFFERENTIAL/PLATELET - Abnormal; Notable for the following components:   Platelets 148 (*)    All other components within normal limits  LIPASE, BLOOD    EKG EKG Interpretation Date/Time:  Sunday December 21 2022 23:59:29 EDT Ventricular Rate:  82 PR Interval:  177 QRS Duration:  86 QT Interval:  373 QTC Calculation: 436 R Axis:   59  Text Interpretation: Sinus rhythm Probable left atrial enlargement Nonspecific T abnormalities, lateral leads When compared with ECG of 10/29/2021, No significant change was found Confirmed by Dione Booze ([id number]) on 12/22/2022 12:41:55 AM  Radiology CT ABDOMEN PELVIS W CONTRAST  Result Date: 12/22/2022 CLINICAL DATA:  Bowel obstruction suspected EXAM: CT ABDOMEN AND PELVIS WITH CONTRAST TECHNIQUE: Multidetector CT imaging of the abdomen and pelvis was performed using the standard protocol following bolus administration of intravenous contrast. RADIATION DOSE REDUCTION: This exam was performed according to the departmental dose-optimization program which includes automated exposure control, adjustment of the mA and/or kV according to patient size and/or use of iterative reconstruction technique. CONTRAST:  OMNIPAQUE IOHEXOL 300 MG/ML  SOLN COMPARISON:  None Available. FINDINGS: Lower chest: No acute abnormality. Hepatobiliary: No focal liver abnormality. No gallstones, gallbladder wall thickening, or pericholecystic fluid. No biliary dilatation. Pancreas: No focal lesion. Normal pancreatic contour. No surrounding inflammatory changes. No main pancreatic ductal dilatation. Spleen: Normal in size without focal abnormality. Adrenals/Urinary Tract: No adrenal nodule bilaterally. Bilateral kidneys enhance symmetrically. No hydronephrosis. No hydroureter. The urinary bladder is unremarkable. On delayed imaging, there is no urothelial  wall thickening and there are no filling defects in the opacified portions of the bilateral collecting systems or ureters. Stomach/Bowel: Stomach is within normal limits. No evidence of bowel wall thickening or dilatation. The appendix is not definitely identified with no inflammatory changes in the right lower quadrant to suggest acute appendicitis. Vascular/Lymphatic: No abdominal aorta or iliac aneurysm. Mild atherosclerotic plaque of the aorta and its branches. No abdominal, pelvic, or inguinal lymphadenopathy. Reproductive: Status post hysterectomy. No adnexal masses. Other: No intraperitoneal free fluid. No intraperitoneal free gas. No organized fluid collection. Musculoskeletal: Moderate volume umbilical ventral wall hernia containing fat as well as a foci of gas (6:80 6-87). Associated slight fat stranding of the contained fat. Associated abdominal defect of 1.2 cm. Just superiorly a supraumbilical ventral wall hernia containing fat with an abdominal defect of 0.9 cm (6:85). No suspicious lytic or blastic osseous lesions. No acute displaced fracture. Multilevel degenerative changes of the spine. Grade 1 anterolisthesis of L5 on S1. Severe degenerative changes of the left shoulder. IMPRESSION: 1. Moderate volume umbilical ventral wall hernia containing fat as well as a foci of gas. Finding may represent a Richter hernia with tiny segment of transverse colon or mesenteric venous gas. Associated slight fat stranding of  the contained fat. Correlate clinically for incarceration. 2. Supraumbilical ventral wall hernia containing fat with an abdominal defect of 0.9 cm. 3.  Aortic Atherosclerosis (ICD10-I70.0). Electronically Signed   By: Tish Frederickson M.D.   On: 12/22/2022 01:58    Procedures Procedures  Cardiac monitor shows normal sinus rhythm, per my interpretation.  Medications Ordered in ED Medications  ondansetron (ZOFRAN) injection 4 mg (has no administration in time range)    ED Course/ Medical  Decision Making/ A&P                                 Medical Decision Making Amount and/or Complexity of Data Reviewed Labs: ordered. Radiology: ordered.  Risk Prescription drug management.   Nausea and vomiting.  Differential diagnosis includes, but is not limited to, viral enteritis, food poisoning, small bowel obstruction.  Although hernia is present, it is easily reducible and I do not feel is related to her current episode of emesis.  However, intermittent obstruction could occur accounting for her recurrence of symptoms.  I have reviewed her past records, and note ED visit on 10/29/2022 for nausea and vomiting.  I have ordered laboratory workup with CBC, comprehensive metabolic panel, lipase.  I have ordered ondansetron for nausea.  I have ordered CT of abdomen and pelvis.  I have reviewed her electrocardiogram, and my interpretation is nonspecific T wave changes which are unchanged from prior.  I have reviewed her laboratory tests, and my interpretation is normal urinalysis, normal lipase, elevated random glucose which will need to be followed as an outpatient, normal CBC except for borderline thrombocytopenia which is not felt to be clinically significant.  CT scan shows ventral wall hernia containing fat as well as a focus of gas which was concerning for possible Richter hernia.  I have independently viewed the images, agree with radiologist interpretation.  Clinically, she does not have an incarcerated hernia and the hernia is nontender and easily reducible-clinically not a Richter hernia.  I am discharging her with the referrals to gastroenterology and general surgery.  Patient states that she was not able to tolerate ondansetron which was prescribed at her prior ED visit.  Therefore, I am giving her a prescription for prochlorperazine suppository to use as needed.  Return precautions discussed.  Final Clinical Impression(s) / ED Diagnoses Final diagnoses:  Nausea and vomiting,  unspecified vomiting type  Ventral hernia without obstruction or gangrene    Rx / DC Orders ED Discharge Orders          Ordered    prochlorperazine (COMPAZINE) 25 MG suppository  Every 8 hours PRN        12/22/22 0255              Dione Booze, MD 12/22/22 0301

## 2022-12-22 NOTE — ED Notes (Signed)
ED Provider at bedside. 

## 2022-12-22 NOTE — ED Notes (Signed)
Patient transported to CT 

## 2022-12-23 ENCOUNTER — Ambulatory Visit (HOSPITAL_COMMUNITY)
Admission: RE | Admit: 2022-12-23 | Discharge: 2022-12-23 | Disposition: A | Discharge status: Home / Self Care | Payer: Medicare PPO | Source: Ambulatory Visit | Attending: Family Medicine | Admitting: Family Medicine

## 2022-12-23 ENCOUNTER — Other Ambulatory Visit (HOSPITAL_COMMUNITY): Payer: Self-pay | Admitting: Family Medicine

## 2022-12-23 DIAGNOSIS — I10 Essential (primary) hypertension: Secondary | ICD-10-CM | POA: Diagnosis not present

## 2022-12-23 DIAGNOSIS — R053 Chronic cough: Secondary | ICD-10-CM | POA: Diagnosis not present

## 2022-12-23 DIAGNOSIS — E559 Vitamin D deficiency, unspecified: Secondary | ICD-10-CM | POA: Diagnosis not present

## 2022-12-23 DIAGNOSIS — R059 Cough, unspecified: Secondary | ICD-10-CM | POA: Diagnosis not present

## 2022-12-23 DIAGNOSIS — D518 Other vitamin B12 deficiency anemias: Secondary | ICD-10-CM | POA: Diagnosis not present

## 2022-12-23 DIAGNOSIS — R7309 Other abnormal glucose: Secondary | ICD-10-CM | POA: Diagnosis not present

## 2022-12-23 DIAGNOSIS — Z23 Encounter for immunization: Secondary | ICD-10-CM | POA: Diagnosis not present

## 2022-12-23 DIAGNOSIS — Z1331 Encounter for screening for depression: Secondary | ICD-10-CM | POA: Diagnosis not present

## 2022-12-23 DIAGNOSIS — E6609 Other obesity due to excess calories: Secondary | ICD-10-CM | POA: Diagnosis not present

## 2022-12-23 DIAGNOSIS — E782 Mixed hyperlipidemia: Secondary | ICD-10-CM | POA: Diagnosis not present

## 2022-12-23 DIAGNOSIS — E039 Hypothyroidism, unspecified: Secondary | ICD-10-CM | POA: Diagnosis not present

## 2022-12-23 DIAGNOSIS — Z0001 Encounter for general adult medical examination with abnormal findings: Secondary | ICD-10-CM | POA: Diagnosis not present

## 2023-01-07 ENCOUNTER — Encounter: Payer: Self-pay | Admitting: Internal Medicine

## 2023-01-07 ENCOUNTER — Ambulatory Visit (INDEPENDENT_AMBULATORY_CARE_PROVIDER_SITE_OTHER): Payer: Medicare PPO | Admitting: Internal Medicine

## 2023-01-07 ENCOUNTER — Other Ambulatory Visit: Payer: Self-pay | Admitting: *Deleted

## 2023-01-07 VITALS — BP 136/72 | HR 83 | Temp 98.1°F | Ht 62.0 in | Wt 202.5 lb

## 2023-01-07 DIAGNOSIS — K429 Umbilical hernia without obstruction or gangrene: Secondary | ICD-10-CM | POA: Diagnosis not present

## 2023-01-07 DIAGNOSIS — R112 Nausea with vomiting, unspecified: Secondary | ICD-10-CM

## 2023-01-07 DIAGNOSIS — R1033 Periumbilical pain: Secondary | ICD-10-CM

## 2023-01-07 NOTE — Progress Notes (Signed)
Referring Provider: Assunta Found, MD Primary Care Physician:  Assunta Found, MD Primary GI:  Dr. Marletta Lor  Chief Complaint  Patient presents with   Follow-up    Hospital follow up for nausea and vomiting. Pt states she is better    HPI:   Chelsey Johnson is a 72 y.o. female who presents to the clinic today for ER follow-up visit.  Presented to [facility], ER 12/22/2022 with sudden onset abdominal pain, nausea and vomiting.  Abdominal pain she states was severe, periumbilical region.  Notes this was the third episode over the last 3 months.  In the ER had CT abdomen pelvis with contrast performed which I personally reviewed.  This showed a moderate size umbilical hernia containing fat as well as gas.  Possible Richter hernia with tiny segment transverse colon with slight fat stranding.  Patient's status post hysterectomy for uterine fibroids.  Currently, states her symptoms are much improved.  No further abdominal pain or nausea or vomiting.  States her hernia is easily reducible.  Past Medical History:  Diagnosis Date   Allergy    Anesthesia complication    tachycardia   Breast cancer (HCC)    Complication of anesthesia    blood pressure drops quickly with anesthetic   HTN (hypertension)    Obesity     Past Surgical History:  Procedure Laterality Date   ABDOMINAL HYSTERECTOMY  1986   complete, uterine fibroids   BREAST BIOPSY Right    negative   COLONOSCOPY  09/19/2011   Procedure: COLONOSCOPY;  Surgeon: West Bali, MD;  Location: [facility];  Service: Endoscopy;  Laterality: N/A;  12:30   COLONOSCOPY WITH PROPOFOL N/A 10/31/2021   Procedure: COLONOSCOPY WITH PROPOFOL;  Surgeon: Lanelle Bal, DO;  Location: [facility];  Service: Endoscopy;  Laterality: N/A;  950   PARTIAL MASTECTOMY WITH NEEDLE LOCALIZATION Right 03/09/2019   Procedure: PARTIAL MASTECTOMY WITH NEEDLE LOCALIZATION;  Surgeon: Franky Macho, MD;  Location: [facility] OR[facility];  Service: General;   Laterality: Right;   POLYPECTOMY  10/31/2021   Procedure: POLYPECTOMY;  Surgeon: Lanelle Bal, DO;  Location: [facility];  Service: Endoscopy;;    Current Outpatient Medications  Medication Sig Dispense Refill   anastrozole (ARIMIDEX) 1 MG tablet Take 1 tablet (1 mg total) by mouth daily. 90 tablet 3   Calcium 500 MG tablet Take 1 tablet (500 mg total) by mouth daily. 90 tablet 3   Cholecalciferol (VITAMIN D3 PO) Take 1 tablet by mouth daily.     clopidogrel (PLAVIX) 75 MG tablet Take 75 mg by mouth daily.     diphenhydrAMINE-zinc acetate (BENADRYL) cream Apply 1 Application topically 3 (three) times daily as needed for itching.     furosemide (LASIX) 20 MG tablet Take 20 mg by mouth daily as needed for fluid or edema.     losartan-hydrochlorothiazide (HYZAAR) 50-12.5 MG tablet Take 1 tablet by mouth daily.     Menthol, Topical Analgesic, (BIOFREEZE) 10 % CREA Apply 1 Application topically daily as needed (arthritis pain).     OMEGA 3 1200 MG CAPS Take 1,200 mg by mouth every other day.     rosuvastatin (CRESTOR) 10 MG tablet Take 10 mg by mouth daily.     aspirin EC 81 MG tablet Take 81 mg by mouth every other day. (Patient not taking: Reported on 01/07/2023)     ondansetron (ZOFRAN-ODT) 4 MG disintegrating tablet 4mg  ODT q4 hours prn nausea/vomit (Patient not taking: Reported on 01/07/2023)  10 tablet 0   prochlorperazine (COMPAZINE) 25 MG suppository Place 1 suppository (25 mg total) rectally every 8 (eight) hours as needed for nausea or vomiting. (Patient not taking: Reported on 01/07/2023) 12 suppository 0   No current facility-administered medications for this visit.    Allergies as of 01/07/2023 - Review Complete 01/07/2023  Allergen Reaction Noted   Anesthetics, amide Palpitations 09/11/2011   Tylox [oxycodone-acetaminophen] Palpitations 09/11/2011   Iodinated contrast media Itching 12/22/2022    Family History  Problem Relation Age of Onset   Ulcers Brother    Colon  polyps Mother 60   Congestive Heart Failure Mother    Lymphoma Father    Hypertension Sister    Breast cancer Sister    Hypertension Sister    Breast cancer Sister    Hypertension Sister    Hypertension Sister     Social History   Socioeconomic History   Marital status: Single    Spouse name: Not on file   Number of children: 0   Years of education: Not on file   Highest education level: Not on file  Occupational History   Occupation: [employer]ologist [employer]    Employer: [employer]  Tobacco Use   Smoking status: Former    Current packs/day: 0.00    Average packs/day: 1 pack/day for 10.0 years (10.0 ttl pk-yrs)    Types: Cigarettes    Start date: 09/10/1981    Quit date: 09/11/1991    Years since quitting: 31.3   Smokeless tobacco: Never   Tobacco comments:    quit about 20 + years  Vaping Use   Vaping status: Never Used  Substance and Sexual Activity   Alcohol use: No   Drug use: No   Sexual activity: Not Currently    Birth control/protection: Surgical    Comment: hyst  Other Topics Concern   Not on file  Social History Narrative   Lives w/ mother   Social Determinants of Health   Financial Resource Strain: Low Risk  (08/13/2021)   Overall Financial Resource Strain (CARDIA)    Difficulty of Paying Living Expenses: Not hard at all  Food Insecurity: No Food Insecurity (08/13/2021)   Hunger Vital Sign    Worried About Running Out of Food in the Last Year: Never true    Ran Out of Food in the Last Year: Never true  Transportation Needs: No Transportation Needs (08/13/2021)   PRAPARE - Administrator, Civil Service (Medical): No    Lack of Transportation (Non-Medical): No  Physical Activity: Sufficiently Active (08/13/2021)   Exercise Vital Sign    Days of Exercise per Week: 7 days    Minutes of Exercise per Session: 100 min  Stress: Stress Concern Present (08/13/2021)   [employer] of Occupational Health - Occupational  Stress Questionnaire    Feeling of Stress : To some extent  Social Connections: Moderately Integrated (08/13/2021)   Social Connection and Isolation Panel [NHANES]    Frequency of Communication with Friends and Family: More than three times a week    Frequency of Social Gatherings with Friends and Family: Once a week    Attends Religious Services: More than 4 times per year    Active Member of [employer] or Organizations: Yes    Attends Engineer, structural: More than 4 times per year    Marital Status: Never married    Subjective: Review of Systems  Constitutional:  Negative for chills and fever.  HENT:  Negative for congestion and hearing loss.   Eyes:  Negative for blurred vision and double vision.  Respiratory:  Negative for cough and shortness of breath.   Cardiovascular:  Negative for chest pain and palpitations.  Gastrointestinal:  Negative for abdominal pain, blood in stool, constipation, diarrhea, heartburn, melena and vomiting.  Genitourinary:  Negative for dysuria and urgency.  Musculoskeletal:  Negative for joint pain and myalgias.  Skin:  Negative for itching and rash.  Neurological:  Negative for dizziness and headaches.  Psychiatric/Behavioral:  Negative for depression. The patient is not nervous/anxious.      Objective: BP 136/72   Pulse 83   Temp 98.1 F (36.7 C)   Ht 5\' 2"  (1.575 m)   Wt 202 lb 8 oz (91.9 kg)   BMI 37.04 kg/m  Physical Exam Constitutional:      Appearance: Normal appearance.  HENT:     Head: Normocephalic and atraumatic.  Eyes:     Extraocular Movements: Extraocular movements intact.     Conjunctiva/sclera: Conjunctivae normal.  Cardiovascular:     Rate and Rhythm: Normal rate and regular rhythm.  Pulmonary:     Effort: Pulmonary effort is normal.     Breath sounds: Normal breath sounds.  Abdominal:     General: Bowel sounds are normal.     Palpations: Abdomen is soft.     Hernia: A hernia is present.  Musculoskeletal:         General: No swelling. Normal range of motion.     Cervical back: Normal range of motion and neck supple.  Skin:    General: Skin is warm and dry.     Coloration: Skin is not jaundiced.  Neurological:     General: No focal deficit present.     Mental Status: She is alert and oriented to person, place, and time.  Psychiatric:        Mood and Affect: Mood normal.        Behavior: Behavior normal.      Assessment: *Umbilical hernia *Abdominal pain *Nausea and vomiting  Plan: Reviewed CT scan with patient today.  Discussed umbilical hernia with her as well.  I will refer her to general surgery to discuss possible fixation.  Patient requesting Dr. Lovell Sheehan as he previously performed breast surgery on her in 2020.  Patient counseled on avoiding heavy lifting or other strenuous exercises/activities. Work on weight loss.  If patient has significant pain in the region, redness or tenderness, and is unable to reduce the hernia while lying flat, recommend going to the ER for further evaluation.  Follow-up with GI as needed.  01/07/2023 10:25 AM   Disclaimer: This note was dictated with voice recognition software. Similar sounding words can inadvertently be transcribed and may not be corrected upon review.

## 2023-01-07 NOTE — Patient Instructions (Signed)
I will refer you to Dr. Lovell Sheehan to further discuss your umbilical hernia.  Would recommend you avoi heavy lifting or other strenuous exercises/activities. Work on weight loss.  If you have significant pain in the region, redness or tenderness, and are unable to reduce the hernia while lying flat, recommend you go to the ER for further valuation.  It was very nice seeing you today.  Dr. Marletta Lor

## 2023-02-12 ENCOUNTER — Encounter: Payer: Self-pay | Admitting: General Surgery

## 2023-02-12 ENCOUNTER — Ambulatory Visit: Payer: Medicare PPO | Admitting: General Surgery

## 2023-02-12 VITALS — BP 135/83 | HR 79 | Temp 97.8°F | Resp 14 | Ht 62.0 in | Wt 196.0 lb

## 2023-02-12 DIAGNOSIS — K432 Incisional hernia without obstruction or gangrene: Secondary | ICD-10-CM

## 2023-02-12 NOTE — Progress Notes (Signed)
Chelsey Johnson; [id number]; 18-Sep-1950   HPI Patient is a 72 year old black female who was referred to my care by Dr. Phillips Odor and Dr. Marletta Lor for evaluation treatment of a ventral hernia.  Patient states that she has noticed some swelling around her umbilicus for some time now.  It is made worse with straining.  It did cause her some initial discomfort and tingling, though she has never had any nausea or vomiting.  She has never had an episode of incarceration.  She states that more recently she has not really been affected by it.  She is status post a hysterectomy through a lower midline scar in the remote past. Past Medical History:  Diagnosis Date   Allergy    Anesthesia complication    tachycardia   Breast cancer (HCC)    Complication of anesthesia    blood pressure drops quickly with anesthetic   HTN (hypertension)    Obesity     Past Surgical History:  Procedure Laterality Date   ABDOMINAL HYSTERECTOMY  1986   complete, uterine fibroids   BREAST BIOPSY Right    negative   COLONOSCOPY  09/19/2011   Procedure: COLONOSCOPY;  Surgeon: West Bali, MD;  Location: [facility];  Service: Endoscopy;  Laterality: N/A;  12:30   COLONOSCOPY WITH PROPOFOL N/A 10/31/2021   Procedure: COLONOSCOPY WITH PROPOFOL;  Surgeon: Lanelle Bal, DO;  Location: [facility];  Service: Endoscopy;  Laterality: N/A;  950   PARTIAL MASTECTOMY WITH NEEDLE LOCALIZATION Right 03/09/2019   Procedure: PARTIAL MASTECTOMY WITH NEEDLE LOCALIZATION;  Surgeon: Franky Macho, MD;  Location: [facility];  Service: General;  Laterality: Right;   POLYPECTOMY  10/31/2021   Procedure: POLYPECTOMY;  Surgeon: Lanelle Bal, DO;  Location: [facility];  Service: Endoscopy;;    Family History  Problem Relation Age of Onset   Ulcers Brother    Colon polyps Mother 57   Congestive Heart Failure Mother    Lymphoma Father    Hypertension Sister    Breast cancer Sister    Hypertension Sister    Breast cancer Sister     Hypertension Sister    Hypertension Sister     Current Outpatient Medications on File Prior to Visit  Medication Sig Dispense Refill   anastrozole (ARIMIDEX) 1 MG tablet Take 1 tablet (1 mg total) by mouth daily. 90 tablet 3   Calcium 500 MG tablet Take 1 tablet (500 mg total) by mouth daily. 90 tablet 3   Cholecalciferol (VITAMIN D3 PO) Take 1 tablet by mouth daily.     clopidogrel (PLAVIX) 75 MG tablet Take 75 mg by mouth daily.     diphenhydrAMINE-zinc acetate (BENADRYL) cream Apply 1 Application topically 3 (three) times daily as needed for itching.     furosemide (LASIX) 20 MG tablet Take 20 mg by mouth daily as needed for fluid or edema.     losartan-hydrochlorothiazide (HYZAAR) 50-12.5 MG tablet Take 1 tablet by mouth daily.     Menthol, Topical Analgesic, (BIOFREEZE) 10 % CREA Apply 1 Application topically daily as needed (arthritis pain).     OMEGA 3 1200 MG CAPS Take 1,200 mg by mouth every other day.     ondansetron (ZOFRAN-ODT) 4 MG disintegrating tablet 4mg  ODT q4 hours prn nausea/vomit 10 tablet 0   prochlorperazine (COMPAZINE) 25 MG suppository Place 1 suppository (25 mg total) rectally every 8 (eight) hours as needed for nausea or vomiting. 12 suppository 0   rosuvastatin (CRESTOR) 10 MG tablet Take  10 mg by mouth daily.     aspirin EC 81 MG tablet Take 81 mg by mouth every other day. (Patient not taking: Reported on 01/07/2023)     No current facility-administered medications on file prior to visit.    Allergies  Allergen Reactions   Anesthetics, Amide Palpitations    Pt reports Tachycardia w/ HRs 170's with General Anesthesia but cannot give details regarding names of medications.   Tylox [Oxycodone-Acetaminophen] Palpitations    Tachycardia    Iodinated Contrast Media Itching    Pt began itching after Omnipaque injection.     Social History   Substance and Sexual Activity  Alcohol Use No    Social History   Tobacco Use  Smoking Status Former   Current  packs/day: 0.00   Average packs/day: 1 pack/day for 10.0 years (10.0 ttl pk-yrs)   Types: Cigarettes   Start date: 09/10/1981   Quit date: 09/11/1991   Years since quitting: 31.4  Smokeless Tobacco Never  Tobacco Comments   quit about 20 + years    Review of Systems  Constitutional: Negative.   HENT: Negative.    Eyes: Negative.   Respiratory: Negative.    Cardiovascular: Negative.   Gastrointestinal: Negative.   Genitourinary: Negative.   Musculoskeletal: Negative.   Skin: Negative.   Neurological: Negative.   Endo/Heme/Allergies: Negative.   Psychiatric/Behavioral: Negative.      Objective   Vitals:   02/12/23 1147  BP: 135/83  Pulse: 79  Resp: 14  Temp: 97.8 F (36.6 C)  SpO2: 98%    Physical Exam Vitals reviewed.  Constitutional:      Appearance: Normal appearance. She is not ill-appearing.  HENT:     Head: Normocephalic and atraumatic.  Cardiovascular:     Rate and Rhythm: Normal rate and regular rhythm.     Heart sounds: Normal heart sounds. No murmur heard.    No friction rub. No gallop.  Pulmonary:     Effort: Pulmonary effort is normal. No respiratory distress.     Breath sounds: Normal breath sounds. No stridor. No wheezing, rhonchi or rales.  Abdominal:     General: Bowel sounds are normal. There is no distension.     Palpations: Abdomen is soft. There is no mass.     Tenderness: There is no abdominal tenderness. There is no guarding or rebound.     Hernia: A hernia is present.     Comments: Patient has an easily reducible hernia at the umbilicus which also may be connected to a hernia that is along the superior aspect of her lower midline scar just below the umbilicus.  Total measurement is approximately 5 cm.  She has no pain to palpation.  She does have laxity of the lower abdominal wall.  Skin:    General: Skin is warm and dry.  Neurological:     Mental Status: She is alert and oriented to person, place, and time.     Assessment   Incisional hernia, currently asymptomatic Plan  I suspect she has had this hernia for many years and she currently is asymptomatic.  The risk of incarceration is low.  I told her that should it become more bothersome, she should return to my care for reevaluation.  She understands and agrees.  She does not want surgery unless absolutely necessary.  Follow-up here as needed.

## 2023-02-25 ENCOUNTER — Ambulatory Visit (HOSPITAL_COMMUNITY)
Admission: RE | Admit: 2023-02-25 | Discharge: 2023-02-25 | Disposition: A | Discharge status: Home / Self Care | Payer: Medicare PPO | Source: Ambulatory Visit | Attending: Physician Assistant | Admitting: Physician Assistant

## 2023-02-25 DIAGNOSIS — D0511 Intraductal carcinoma in situ of right breast: Secondary | ICD-10-CM

## 2023-02-25 DIAGNOSIS — Z1231 Encounter for screening mammogram for malignant neoplasm of breast: Secondary | ICD-10-CM | POA: Diagnosis not present

## 2023-02-25 DIAGNOSIS — Z853 Personal history of malignant neoplasm of breast: Secondary | ICD-10-CM | POA: Diagnosis not present

## 2023-02-25 DIAGNOSIS — R921 Mammographic calcification found on diagnostic imaging of breast: Secondary | ICD-10-CM | POA: Diagnosis not present

## 2023-03-02 ENCOUNTER — Other Ambulatory Visit (HOSPITAL_COMMUNITY): Payer: Self-pay | Admitting: Physician Assistant

## 2023-03-02 DIAGNOSIS — R928 Other abnormal and inconclusive findings on diagnostic imaging of breast: Secondary | ICD-10-CM

## 2023-03-10 ENCOUNTER — Ambulatory Visit (HOSPITAL_COMMUNITY)
Admission: RE | Admit: 2023-03-10 | Discharge: 2023-03-10 | Disposition: A | Discharge status: Home / Self Care | Payer: Medicare PPO | Source: Ambulatory Visit | Attending: Physician Assistant | Admitting: Physician Assistant

## 2023-03-10 ENCOUNTER — Inpatient Hospital Stay: Payer: Medicare PPO | Attending: Physician Assistant

## 2023-03-10 ENCOUNTER — Encounter (HOSPITAL_COMMUNITY): Payer: Self-pay

## 2023-03-10 DIAGNOSIS — M8580 Other specified disorders of bone density and structure, unspecified site: Secondary | ICD-10-CM | POA: Diagnosis not present

## 2023-03-10 DIAGNOSIS — Z803 Family history of malignant neoplasm of breast: Secondary | ICD-10-CM | POA: Insufficient documentation

## 2023-03-10 DIAGNOSIS — Z807 Family history of other malignant neoplasms of lymphoid, hematopoietic and related tissues: Secondary | ICD-10-CM | POA: Diagnosis not present

## 2023-03-10 DIAGNOSIS — Z79811 Long term (current) use of aromatase inhibitors: Secondary | ICD-10-CM | POA: Insufficient documentation

## 2023-03-10 DIAGNOSIS — R928 Other abnormal and inconclusive findings on diagnostic imaging of breast: Secondary | ICD-10-CM | POA: Diagnosis not present

## 2023-03-10 DIAGNOSIS — R921 Mammographic calcification found on diagnostic imaging of breast: Secondary | ICD-10-CM | POA: Diagnosis not present

## 2023-03-10 DIAGNOSIS — D0511 Intraductal carcinoma in situ of right breast: Secondary | ICD-10-CM | POA: Insufficient documentation

## 2023-03-10 DIAGNOSIS — R92322 Mammographic fibroglandular density, left breast: Secondary | ICD-10-CM | POA: Diagnosis not present

## 2023-03-10 DIAGNOSIS — Z800 Family history of malignant neoplasm of digestive organs: Secondary | ICD-10-CM | POA: Diagnosis not present

## 2023-03-10 LAB — CBC WITH DIFFERENTIAL/PLATELET
Abs Immature Granulocytes: 0.01 10*3/uL (ref 0.00–0.07)
Basophils Absolute: 0 10*3/uL (ref 0.0–0.1)
Basophils Relative: 1 %
Eosinophils Absolute: 0.2 10*3/uL (ref 0.0–0.5)
Eosinophils Relative: 4 %
HCT: 39.6 % (ref 36.0–46.0)
Hemoglobin: 12.4 g/dL (ref 12.0–15.0)
Immature Granulocytes: 0 %
Lymphocytes Relative: 29 %
Lymphs Abs: 1.1 10*3/uL (ref 0.7–4.0)
MCH: 27.6 pg (ref 26.0–34.0)
MCHC: 31.3 g/dL (ref 30.0–36.0)
MCV: 88.2 fL (ref 80.0–100.0)
Monocytes Absolute: 0.5 10*3/uL (ref 0.1–1.0)
Monocytes Relative: 14 %
Neutro Abs: 2 10*3/uL (ref 1.7–7.7)
Neutrophils Relative %: 52 %
Platelets: 187 10*3/uL (ref 150–400)
RBC: 4.49 MIL/uL (ref 3.87–5.11)
RDW: 13.2 % (ref 11.5–15.5)
WBC: 3.8 10*3/uL — ABNORMAL LOW (ref 4.0–10.5)
nRBC: 0 % (ref 0.0–0.2)

## 2023-03-10 LAB — COMPREHENSIVE METABOLIC PANEL
ALT: 18 U/L (ref 0–44)
AST: 22 U/L (ref 15–41)
Albumin: 3.8 g/dL (ref 3.5–5.0)
Alkaline Phosphatase: 55 U/L (ref 38–126)
Anion gap: 9 (ref 5–15)
BUN: 19 mg/dL (ref 8–23)
CO2: 26 mmol/L (ref 22–32)
Calcium: 10.3 mg/dL (ref 8.9–10.3)
Chloride: 105 mmol/L (ref 98–111)
Creatinine, Ser: 0.63 mg/dL (ref 0.44–1.00)
GFR, Estimated: >60 mL/min (ref >60)
Glucose, Bld: 107 mg/dL — ABNORMAL HIGH (ref 70–99)
Potassium: 4.1 mmol/L (ref 3.5–5.1)
Sodium: 140 mmol/L (ref 135–145)
Total Bilirubin: 0.3 mg/dL (ref <1.2)
Total Protein: 6.7 g/dL (ref 6.5–8.1)

## 2023-03-10 LAB — VITAMIN D 25 HYDROXY (VIT D DEFICIENCY, FRACTURES): Vit D, 25-Hydroxy: 48.74 ng/mL (ref 30–100)

## 2023-03-11 ENCOUNTER — Other Ambulatory Visit (HOSPITAL_COMMUNITY): Payer: Self-pay | Admitting: Physician Assistant

## 2023-03-11 ENCOUNTER — Inpatient Hospital Stay: Payer: Medicare PPO

## 2023-03-11 DIAGNOSIS — Z923 Personal history of irradiation: Secondary | ICD-10-CM | POA: Diagnosis not present

## 2023-03-11 DIAGNOSIS — R921 Mammographic calcification found on diagnostic imaging of breast: Secondary | ICD-10-CM

## 2023-03-11 DIAGNOSIS — D0511 Intraductal carcinoma in situ of right breast: Secondary | ICD-10-CM | POA: Diagnosis not present

## 2023-03-11 DIAGNOSIS — R928 Other abnormal and inconclusive findings on diagnostic imaging of breast: Secondary | ICD-10-CM

## 2023-03-16 ENCOUNTER — Ambulatory Visit
Admission: RE | Admit: 2023-03-16 | Discharge: 2023-03-16 | Disposition: A | Discharge status: Home / Self Care | Payer: Medicare PPO | Source: Ambulatory Visit | Attending: Physician Assistant | Admitting: Physician Assistant

## 2023-03-16 DIAGNOSIS — N6321 Unspecified lump in the left breast, upper outer quadrant: Secondary | ICD-10-CM | POA: Diagnosis not present

## 2023-03-16 DIAGNOSIS — R921 Mammographic calcification found on diagnostic imaging of breast: Secondary | ICD-10-CM

## 2023-03-16 DIAGNOSIS — R928 Other abnormal and inconclusive findings on diagnostic imaging of breast: Secondary | ICD-10-CM

## 2023-03-16 DIAGNOSIS — N6489 Other specified disorders of breast: Secondary | ICD-10-CM | POA: Diagnosis not present

## 2023-03-16 HISTORY — PX: BREAST BIOPSY: SHX20

## 2023-03-17 LAB — SURGICAL PATHOLOGY

## 2023-03-17 NOTE — Progress Notes (Addendum)
[facility] [address], [place] [address]   CLINIC:  Medical Oncology/Hematology  PCP:  Assunta Found, MD [address] / [place] [address] [phone]   REASON FOR VISIT:  Follow-up for history of right breast DCIS   PRIOR THERAPY: Right partial mastectomy (03/09/2019) + radiation therapy completed in [facility] (February 2021)   CURRENT THERAPY: Anastrozole since February 2021  BRIEF ONCOLOGIC HISTORY:   Oncology History  Ductal carcinoma in situ (DCIS) of right breast  03/09/2019 Initial Diagnosis   Ductal carcinoma in situ (DCIS) of right breast   04/05/2019 Cancer Staging   Staging form: Breast, AJCC 8th Edition - Clinical stage from 04/05/2019: Stage 0 (cTis (DCIS), cN0, cM0, ER+, PR+, HER2: Not Assessed) - Signed by Doreatha Massed, MD on 04/05/2019     CANCER STAGING:  Cancer Staging  Ductal carcinoma in situ (DCIS) of right breast Staging form: Breast, AJCC 8th Edition - Clinical stage from 04/05/2019: Stage 0 (cTis (DCIS), cN0, cM0, ER+, PR+, HER2: Not Assessed) - Signed by Doreatha Massed, MD on 04/05/2019 - Pathologic: No stage assigned - Unsigned   INTERVAL HISTORY:   [name], a 72 y.o. female, returns for routine follow-up of her history of right breast DCIS. Ensley was last seen on 09/10/2022 by Rojelio Brenner PA-C.   She had bilateral screening mammogram on 02/25/2023 that was BI-RADS Category 0 due to left breast calcifications warranting further evaluation.  Diagnostic mammogram of left breast (03/10/2023) showed indeterminate 1.5 cm group of amorphous calcifications in upper outer posterior left breast as well as indeterminate 0.5 cm group of coarse heterogeneous calcifications in upper outer left breast.  She underwent stereotactic guided core biopsy of these 2 groups of calcifications on 03/16/2023.  Pathology resulted on 03/17/2023 was benign (fibroadenomatoid change with dystrophic calcifications,  negative for carcinoma).  At today's visit, she  reports feeling fairly well.  She denies any recent hospitalizations, surgeries, or changes in her  baseline health status.  She denies any symptoms of recurrence such as new lumps, bone pain, chest pain, dyspnea, or abdominal pain.   She has no new headaches, seizures, or focal neurologic deficits.  No B symptoms such as fever, chills, night sweats, unintentional weight loss.   She continues to take anastrozole, and is tolerating it fairly well with occasional hot flashes and no significant arthralgias.   She is taking vitamin D and calcium.  She reports 90% energy and 90% appetite.  She is maintaining stable weight at this time.   ASSESSMENT & PLAN:  1.  Intermediate grade RIGHT breast DCIS (2020) - Screening mammogram on 01/31/2019 was B RADS category 0.  Additional views of the right breast showed intermediate 0.4 cm mass within the upper outer right breast which is new. - Right breast biopsy at 10 o'clock position on 02/15/2019 consistent with DCIS, intermediate grade, ER/PR positive. - Right partial mastectomy on 03/09/2019 showing 0.6 cm involving an intraductal papilloma, intermediate grade, in situ carcinoma less than 1 mm from inferior margin, not on ink.  Other margins negative.  Right inferior margin excision shows low-grade DCIS, 0.6 cm.  Not completely clear if it is a 2 foci or DCIS. - Radiation therapy completed in [place] in February 2021. - She has been on anastrozole since February 2021.  She is tolerating it well with occasional hot flashes and no significant arthralgias.  Goal is 5 years of treatment in the setting of DCIS. - Area of tissue thickening and nodularity  at inferior margin of right breast (6 o'clock position) noted on exam on 09/10/2022, along with unchanged right breast lymphedema in the lower outer quadrant and right breast upper outer quadrant scar unchanged. Right breast diagnostic mammogram (09/23/2022): Was  BI-RADS Category 2, benign (stable lumpectomy changes in right breast) - Bilateral al screening mammogram on 02/25/2023 that was BI-RADS Category 0 due to left breast calcifications warranting further evaluation.   Diagnostic mammogram of left breast (03/10/2023) showed indeterminate 1.5 cm group of amorphous calcifications in upper outer posterior left breast as well as indeterminate 0.5 cm group of coarse heterogeneous calcifications in upper outer left breast. Stereotactic guided core biopsy of these 2 groups of calcifications on 03/16/2023. Pathology resulted on 03/17/2023 was benign (fibroadenomatoid change with dystrophic calcifications, negative for carcinoma). - On exam today (03/18/2023) she had an area of tissue thickening and nodularity at the inferior margin of the right breast at approximately 6 o'clock position - this is stable compared to prior exam (see above).  Unchanged right breast lymphedema in lower outer quadrant and right breast upper outer quadrant scar unchanged.  LEFT breast exam was DEFERRED due to tenderness from biopsy performed 2 days ago.  No axillary, pectoral, or epitrochlear lymphadenopathy bilaterally. - Patient history/ROS today negative for any "red flag" symptoms of recurrence - Most recent labs (03/10/2023) show normal LFTs and unremarkable CBC. - Bilateral breast tenderness likely related to anastrozole. - PLAN: Labs in 6 months (CBC/D, CMP, vitamin D) with OFFICE visit 1 week after. - Continue daily anastrozole  - Annual screening mammogram due November 2025  - No indication for BCI testing in the setting of DCIS. ## ADDENDUM: Addended result from mammogram biopsy/pathology (03/16/2023, updated on 03/20/2023) - Updated recommendation per radiologist/pathologist was for patient to return in 6 months for unilateral left breast mammogram.   2.  Bone density - DEXA scan on 04/15/2019 with T-score - 0.7 - BONE DENSITY SCAN (09/23/2022) showed significantly worsened  bone density, with T-score -2.3  FRAX = probability of major osteoporotic fracture 8.9% within the next 10 years.  Probability of hip fracture 1.9% within the next 10 years. - Most recent vitamin D (03/10/2023) normal at 48.74.  Calcium 10.3. - She is taking calcium and vitamin D supplement daily.   - PLAN: We discussed recommendation to start Prolia injections to prevent worsening bone loss while taking aromatase inhibitors, particularly considering how much the patient's bone density worsened in such a short amount of time. - Discussed with patient that Prolia therapy is generally well-tolerated, but potential complications include transient flulike symptoms, renal insufficiency, hypocalcemia, atypical femur fractures, and osteonecrosis of the jaw. - Patient has dental appointment in February 2025 - will obtain dental clearance prior to starting Prolia. - Continue calcium and vitamin D - Continue weightbearing exercises to improve bone mass. - Repeat DEXA/bone density scan due June 2026   3.  Family history - Patient has 2 sisters with breast cancer - Patient's father had lymphoma - Patient's brother had prostate cancer - Patient's niece had ovarian cancer - Paternal aunt had stomach cancer - PLAN: Patient declined referral to genetic counselor - she does not have children and reports she will just "pray for the best" in regards to her own health  PLAN SUMMARY: >> Reminder sent to check with patient in February 2025 re: dental clearance >> Labs in 6 months = CBC/D, CMP, vitamin D >> OFFICE visit in 6 months     REVIEW OF SYSTEMS:  Review of Systems  Constitutional:  Negative for appetite change, chills, diaphoresis, fatigue, fever and unexpected weight change.  HENT:   Negative for lump/mass and nosebleeds.   Eyes:  Negative for eye problems.  Respiratory:  Negative for cough, hemoptysis and shortness of breath.   Cardiovascular:  Negative for chest pain, leg swelling and  palpitations.  Gastrointestinal:  Negative for abdominal pain, blood in stool, constipation, diarrhea, nausea and vomiting.  Genitourinary:  Negative for hematuria.   Skin: Negative.   Neurological:  Positive for headaches and numbness. Negative for dizziness and light-headedness.  Hematological:  Does not bruise/bleed easily.    PHYSICAL EXAM:   Performance status (ECOG): 1 - Symptomatic but completely ambulatory  There were no vitals filed for this visit. Wt Readings from Last 3 Encounters:  02/12/23 196 lb (88.9 kg)  01/07/23 202 lb 8 oz (91.9 kg)  12/21/22 211 lb (95.7 kg)   Physical Exam Constitutional:      Appearance: Normal appearance. She is obese.  HENT:     Head: Normocephalic and atraumatic.     Mouth/Throat:     Mouth: Mucous membranes are moist.  Eyes:     Extraocular Movements: Extraocular movements intact.     Pupils: Pupils are equal, round, and reactive to light.  Cardiovascular:     Rate and Rhythm: Normal rate and regular rhythm.     Pulses: Normal pulses.     Heart sounds: Normal heart sounds.  Pulmonary:     Effort: Pulmonary effort is normal.     Breath sounds: Normal breath sounds.  Chest:  Breasts:    Right: Swelling and tenderness present.     Left: Tenderness present.       Comments: - Area of tissue thickening and nodularity at the inferior margin of the right breast at approximately 6 o'clock position.  This was also noted on previous exam and appears stable.   - Unchanged right breast lymphedema in lower outer quadrant and right breast upper outer quadrant scar unchanged.  - LEFT BREAST EXAM DEFERRED due to tenderness from recent biopsy - No axillary, pectoral, or epitrochlear lymphadenopathy bilaterally. Abdominal:     General: Bowel sounds are normal.     Palpations: Abdomen is soft.     Tenderness: There is no abdominal tenderness.  Musculoskeletal:        General: No swelling.     Right lower leg: No edema.     Left lower leg: No  edema.  Lymphadenopathy:     Cervical: No cervical adenopathy.     Upper Body:     Right upper body: No supraclavicular, axillary or pectoral adenopathy.     Left upper body: No supraclavicular, axillary or pectoral adenopathy.  Skin:    General: Skin is warm and dry.  Neurological:     General: No focal deficit present.     Mental Status: She is alert and oriented to person, place, and time.  Psychiatric:        Mood and Affect: Mood normal.        Behavior: Behavior normal.      PAST MEDICAL/SURGICAL HISTORY:  Past Medical History:  Diagnosis Date   Allergy    Anesthesia complication    tachycardia   Breast cancer (HCC) 2020   dcis   Complication of anesthesia    blood pressure drops quickly with anesthetic   HTN (hypertension)    Obesity    Personal history of radiation therapy 2020   Past Surgical History:  Procedure Laterality  Date   ABDOMINAL HYSTERECTOMY  1986   complete, uterine fibroids   BREAST BIOPSY Right 2020   Intermediate grade ductal carcinoma in situ partially involving an  intraductal papilloma   BREAST BIOPSY Left 03/16/2023   MM LT BREAST BX W LOC DEV EA AD LESION IMG BX SPEC STEREO GUIDE 03/16/2023 [facility]   BREAST BIOPSY Left 03/16/2023   MM LT BREAST BX W LOC DEV 1ST LESION IMAGE BX SPEC STEREO GUIDE 03/16/2023 [facility]   COLONOSCOPY  09/19/2011   Procedure: COLONOSCOPY;  Surgeon: West Bali, MD;  Location: [facility];  Service: Endoscopy;  Laterality: N/A;  12:30   COLONOSCOPY WITH PROPOFOL N/A 10/31/2021   Procedure: COLONOSCOPY WITH PROPOFOL;  Surgeon: Lanelle Bal, DO;  Location: [facility];  Service: Endoscopy;  Laterality: N/A;  950   PARTIAL MASTECTOMY WITH NEEDLE LOCALIZATION Right 03/09/2019   Procedure: PARTIAL MASTECTOMY WITH NEEDLE LOCALIZATION;  Surgeon: Franky Macho, MD;  Location: [facility];  Service: General;  Laterality: Right;   POLYPECTOMY  10/31/2021   Procedure: POLYPECTOMY;  Surgeon: Lanelle Bal, DO;  Location: [facility];  Service: Endoscopy;;    SOCIAL HISTORY:  Social History   Socioeconomic History   Marital status: Single    Spouse name: Not on file   Number of children: 0   Years of education: Not on file   Highest education level: Not on file  Occupational History   Occupation: [employer]    Employer: [employer]  Tobacco Use   Smoking status: Former    Current packs/day: 0.00    Average packs/day: 1 pack/day for 10.0 years (10.0 ttl pk-yrs)    Types: Cigarettes    Start date: 09/10/1981    Quit date: 09/11/1991    Years since quitting: 31.5   Smokeless tobacco: Never   Tobacco comments:    quit about 20 + years  Vaping Use   Vaping status: Never Used  Substance and Sexual Activity   Alcohol use: No   Drug use: No   Sexual activity: Not Currently    Birth control/protection: Surgical    Comment: hyst  Other Topics Concern   Not on file  Social History Narrative   Lives w/ mother   Social Drivers of Health   Financial Resource Strain: Low Risk  (08/13/2021)   Overall Financial Resource Strain (CARDIA)    Difficulty of Paying Living Expenses: Not hard at all  Food Insecurity: No Food Insecurity (08/13/2021)   Hunger Vital Sign    Worried About Running Out of Food in the Last Year: Never true    Ran Out of Food in the Last Year: Never true  Transportation Needs: No Transportation Needs (08/13/2021)   PRAPARE - Administrator, Civil Service (Medical): No    Lack of Transportation (Non-Medical): No  Physical Activity: Sufficiently Active (08/13/2021)   Exercise Vital Sign    Days of Exercise per Week: 7 days    Minutes of Exercise per Session: 100 min  Stress: Stress Concern Present (08/13/2021)   [employer] of Occupational Health - Occupational Stress Questionnaire    Feeling of Stress : To some extent  Social Connections: Moderately Integrated (08/13/2021)   Social Connection and  Isolation Panel [NHANES]    Frequency of Communication with Friends and Family: More than three times a week    Frequency of Social Gatherings with Friends and Family: Once a week    Attends Religious  Services: More than 4 times per year    Active Member of Clubs or Organizations: Yes    Attends Banker Meetings: More than 4 times per year    Marital Status: Never married  Intimate Partner Violence: Not At Risk (08/13/2021)   Humiliation, Afraid, Rape, and Kick questionnaire    Fear of Current or Ex-Partner: No    Emotionally Abused: No    Physically Abused: No    Sexually Abused: No    FAMILY HISTORY:  Family History  Problem Relation Age of Onset   Ulcers Brother    Colon polyps Mother 17   Congestive Heart Failure Mother    Lymphoma Father    Hypertension Sister    Breast cancer Sister    Hypertension Sister    Breast cancer Sister    Hypertension Sister    Hypertension Sister     CURRENT MEDICATIONS:  Current Outpatient Medications  Medication Sig Dispense Refill   anastrozole (ARIMIDEX) 1 MG tablet Take 1 tablet (1 mg total) by mouth daily. 90 tablet 3   aspirin EC 81 MG tablet Take 81 mg by mouth every other day. (Patient not taking: Reported on 01/07/2023)     Calcium 500 MG tablet Take 1 tablet (500 mg total) by mouth daily. 90 tablet 3   Cholecalciferol (VITAMIN D3 PO) Take 1 tablet by mouth daily.     clopidogrel (PLAVIX) 75 MG tablet Take 75 mg by mouth daily.     diphenhydrAMINE-zinc acetate (BENADRYL) cream Apply 1 Application topically 3 (three) times daily as needed for itching.     furosemide (LASIX) 20 MG tablet Take 20 mg by mouth daily as needed for fluid or edema.     losartan-hydrochlorothiazide (HYZAAR) 50-12.5 MG tablet Take 1 tablet by mouth daily.     Menthol, Topical Analgesic, (BIOFREEZE) 10 % CREA Apply 1 Application topically daily as needed (arthritis pain).     OMEGA 3 1200 MG CAPS Take 1,200 mg by mouth every other day.      ondansetron (ZOFRAN-ODT) 4 MG disintegrating tablet 4mg  ODT q4 hours prn nausea/vomit 10 tablet 0   prochlorperazine (COMPAZINE) 25 MG suppository Place 1 suppository (25 mg total) rectally every 8 (eight) hours as needed for nausea or vomiting. 12 suppository 0   rosuvastatin (CRESTOR) 10 MG tablet Take 10 mg by mouth daily.     No current facility-administered medications for this visit.    ALLERGIES:  Allergies  Allergen Reactions   Anesthetics, Amide Palpitations    Pt reports Tachycardia w/ HRs 170's with General Anesthesia but cannot give details regarding names of medications.   Tylox [Oxycodone-Acetaminophen] Palpitations    Tachycardia    Iodinated Contrast Media Itching    Pt began itching after Omnipaque injection.     LABORATORY DATA:  I have reviewed the labs as listed.     Latest Ref Rng & Units 03/10/2023    9:23 AM 12/22/2022   12:26 AM 10/29/2022    4:05 AM  CBC  WBC 4.0 - 10.5 K/uL 3.8  5.7  5.4   Hemoglobin 12.0 - 15.0 g/dL 34.7  42.5  95.6   Hematocrit 36.0 - 46.0 % 39.6  38.7  42.3   Platelets 150 - 400 K/uL 187  148  155       Latest Ref Rng & Units 03/10/2023    9:23 AM 12/22/2022   12:26 AM 10/29/2022    4:05 AM  CMP  Glucose 70 - 99 mg/dL 387  161  148   BUN 8 - 23 mg/dL 19  14  19    Creatinine 0.44 - 1.00 mg/dL 1.91  4.78  2.95   Sodium 135 - 145 mmol/L 140  137  136   Potassium 3.5 - 5.1 mmol/L 4.1  3.8  3.8   Chloride 98 - 111 mmol/L 105  101  103   CO2 22 - 32 mmol/L 26  26  27    Calcium 8.9 - 10.3 mg/dL 62.1  9.9  9.6   Total Protein 6.5 - 8.1 g/dL 6.7  7.0  7.4   Total Bilirubin <1.2 mg/dL 0.3  0.5  0.6   Alkaline Phos 38 - 126 U/L 55  59  69   AST 15 - 41 U/L 22  25  25    ALT 0 - 44 U/L 18  21  21      DIAGNOSTIC IMAGING:  I have independently reviewed the scans and discussed with the patient. MM CLIP PLACEMENT LEFT Result Date: 03/16/2023 CLINICAL DATA:  Status post stereo biopsy left breast calcifications, 2 sites EXAM: 3D  DIAGNOSTIC LEFT MAMMOGRAM POST STEREOTACTIC BIOPSY COMPARISON:  Previous exam(s). FINDINGS: 3D Mammographic images were obtained following stereotactic guided biopsy of left breast calcifications, 2 sites. Site 1: Upper-outer left breast posterior: X clip: Approximate 3 mm deep to the biopsied calcifications. Site 2: Upper-outer left breast anterior: Ribbon clip: Approximately 5 mm superficial to the biopsied calcifications. IMPRESSION: Mild clip migration.  Clips as detailed above. Final Assessment: Post Procedure Mammograms for Marker Placement Electronically Signed   By: Annia Belt M.D.   On: 03/16/2023 10:40   MM LT BREAST BX W LOC DEV 1ST LESION IMAGE BX SPEC STEREO GUIDE Result Date: 03/16/2023 CLINICAL DATA:  Indeterminate left breast calcifications, 2 sites EXAM: LEFT BREAST STEREOTACTIC CORE NEEDLE BIOPSY COMPARISON:  Previous exam(s). FINDINGS: The patient and I discussed the procedure of stereotactic-guided biopsy including benefits and alternatives. We discussed the high likelihood of a successful procedure. We discussed the risks of the procedure including infection, bleeding, tissue injury, clip migration, and inadequate sampling. Informed written consent was given. The usual time out protocol was performed immediately prior to the procedure. Site 1: Upper-outer left breast posterior: X Using sterile technique and 1% Lidocaine as local anesthetic, under stereotactic guidance, a 9 gauge vacuum assisted device was used to perform core needle biopsy of calcifications upper-outer left breast posterior using a cranial approach. Specimen radiograph was performed showing calcifications. Specimens with calcifications are identified for pathology. Lesion quadrant: Upper-outer quadrant At the conclusion of the procedure, X shaped tissue marker clip was deployed into the biopsy cavity. Follow-up 2-view mammogram was performed and dictated separately. Site 2: Upper-outer left breast anterior: Ribbon Using  sterile technique and 1% Lidocaine as local anesthetic, under stereotactic guidance, a 9 gauge vacuum assisted device was used to perform core needle biopsy of calcifications upper-outer left breast anterior using a cranial approach. Specimen radiograph was performed showing calcifications. Specimens with calcifications are identified for pathology. Lesion quadrant: Upper-outer quadrant At the conclusion of the procedure, ribbon shaped tissue marker clip was deployed into the biopsy cavity. Follow-up 2-view mammogram was performed and dictated separately. IMPRESSION: Stereotactic-guided biopsy of left breast calcifications, 2 sites. No apparent complications. Electronically Signed   By: Annia Belt M.D.   On: 03/16/2023 10:29   MM LT BREAST BX W LOC DEV EA AD LESION IMG BX SPEC STEREO GUIDE Result Date: 03/16/2023 CLINICAL DATA:  Indeterminate left breast calcifications, 2 sites EXAM: LEFT  BREAST STEREOTACTIC CORE NEEDLE BIOPSY COMPARISON:  Previous exam(s). FINDINGS: The patient and I discussed the procedure of stereotactic-guided biopsy including benefits and alternatives. We discussed the high likelihood of a successful procedure. We discussed the risks of the procedure including infection, bleeding, tissue injury, clip migration, and inadequate sampling. Informed written consent was given. The usual time out protocol was performed immediately prior to the procedure. Site 1: Upper-outer left breast posterior: X Using sterile technique and 1% Lidocaine as local anesthetic, under stereotactic guidance, a 9 gauge vacuum assisted device was used to perform core needle biopsy of calcifications upper-outer left breast posterior using a cranial approach. Specimen radiograph was performed showing calcifications. Specimens with calcifications are identified for pathology. Lesion quadrant: Upper-outer quadrant At the conclusion of the procedure, X shaped tissue marker clip was deployed into the biopsy cavity. Follow-up  2-view mammogram was performed and dictated separately. Site 2: Upper-outer left breast anterior: Ribbon Using sterile technique and 1% Lidocaine as local anesthetic, under stereotactic guidance, a 9 gauge vacuum assisted device was used to perform core needle biopsy of calcifications upper-outer left breast anterior using a cranial approach. Specimen radiograph was performed showing calcifications. Specimens with calcifications are identified for pathology. Lesion quadrant: Upper-outer quadrant At the conclusion of the procedure, ribbon shaped tissue marker clip was deployed into the biopsy cavity. Follow-up 2-view mammogram was performed and dictated separately. IMPRESSION: Stereotactic-guided biopsy of left breast calcifications, 2 sites. No apparent complications. Electronically Signed   By: Annia Belt M.D.   On: 03/16/2023 10:29   MM 3D DIAGNOSTIC MAMMOGRAM UNILATERAL LEFT BREAST Result Date: 03/10/2023 CLINICAL DATA:  Screening recall for left breast calcifications. History of right breast DCIS post lumpectomy 2020. EXAM: DIGITAL DIAGNOSTIC UNILATERAL LEFT MAMMOGRAM WITH TOMOSYNTHESIS AND CAD TECHNIQUE: Left digital diagnostic mammography and breast tomosynthesis was performed. The images were evaluated with computer-aided detection. COMPARISON:  Previous exam(s). ACR Breast Density Category b: There are scattered areas of fibroglandular density. FINDINGS: Spot compression magnification views were performed of the upper outer posterior left breast demonstrating a 1.5 cm group of faint amorphous calcifications. In addition, located slightly anteromedial is an additional 0.5 cm group of coarse heterogeneous calcifications. IMPRESSION: 1. Indeterminate 1.5 cm group of faint amorphous calcifications in the upper-outer posterior left breast. 2. Indeterminate 0.5 cm group of coarse heterogeneous calcifications in the upper-outer left breast. RECOMMENDATION: Recommend stereotactic guided core biopsy of the 2  groups of calcifications in the upper-outer left breast. I have discussed the findings and recommendations with the patient. If applicable, a reminder letter will be sent to the patient regarding the next appointment. BI-RADS CATEGORY  4: Suspicious. Electronically Signed   By: Edwin Cap M.D.   On: 03/10/2023 09:01   MM 3D SCREENING MAMMOGRAM BILATERAL BREAST Result Date: 03/02/2023 CLINICAL DATA:  Screening. EXAM: DIGITAL SCREENING BILATERAL MAMMOGRAM WITH TOMOSYNTHESIS AND CAD TECHNIQUE: Bilateral screening digital craniocaudal and mediolateral oblique mammograms were obtained. Bilateral screening digital breast tomosynthesis was performed. The images were evaluated with computer-aided detection. COMPARISON:  Previous exam(s). ACR Breast Density Category b: There are scattered areas of fibroglandular density. FINDINGS: In the left breast, calcifications warrant further evaluation. In the right breast, no findings suspicious for malignancy. IMPRESSION: Further evaluation is suggested for calcifications in the left breast. RECOMMENDATION: Diagnostic mammogram of the left breast. (Code:[id number]) The patient will be contacted regarding the findings, and additional imaging will be scheduled. BI-RADS CATEGORY  0: Incomplete: Need additional imaging evaluation. Electronically Signed   By: Elberta Fortis M.D.  On: 03/02/2023 13:34     WRAP UP:  All questions were answered. The patient knows to call the clinic with any problems, questions or concerns.  Medical decision making: Moderate  Time spent on visit: I spent 20 minutes counseling the patient face to face. The total time spent in the appointment was 30 minutes and more than 50% was on counseling.  Carnella Guadalajara, PA-C  03/18/23  3:49 PM

## 2023-03-18 ENCOUNTER — Inpatient Hospital Stay (HOSPITAL_BASED_OUTPATIENT_CLINIC_OR_DEPARTMENT_OTHER): Payer: Medicare PPO | Admitting: Physician Assistant

## 2023-03-18 VITALS — BP 127/89 | HR 80 | Temp 99.1°F | Resp 18 | Wt 193.6 lb

## 2023-03-18 DIAGNOSIS — Z79811 Long term (current) use of aromatase inhibitors: Secondary | ICD-10-CM | POA: Diagnosis not present

## 2023-03-18 DIAGNOSIS — Z800 Family history of malignant neoplasm of digestive organs: Secondary | ICD-10-CM | POA: Diagnosis not present

## 2023-03-18 DIAGNOSIS — Z803 Family history of malignant neoplasm of breast: Secondary | ICD-10-CM | POA: Diagnosis not present

## 2023-03-18 DIAGNOSIS — Z807 Family history of other malignant neoplasms of lymphoid, hematopoietic and related tissues: Secondary | ICD-10-CM | POA: Diagnosis not present

## 2023-03-18 DIAGNOSIS — D0511 Intraductal carcinoma in situ of right breast: Secondary | ICD-10-CM

## 2023-03-18 DIAGNOSIS — M8580 Other specified disorders of bone density and structure, unspecified site: Secondary | ICD-10-CM | POA: Diagnosis not present

## 2023-03-18 NOTE — Patient Instructions (Addendum)
[facility] **VISIT SUMMARY & IMPORTANT INSTRUCTIONS **   You were seen today by Rojelio Brenner PA-C for your history of right-sided breast cancer.   Your most recent labs, mammogram, and biopsy were negative for any cancer. You did not have any new abnormalities on your physical exam today. We will plan on rechecking labs and seeing you for office visit in 6 months.  BONE DENSITY: You have OSTEOPENIA, which means that your bones are more fragile and likely to break. - Your bone density scan from June 2024 shows that your bone density has gotten much worse in the past 3 years.  This is likely a side effect from your breast cancer pill. - Having low bone density it means that you are at increased risk of broken bones.   - It is important that you continue taking calcium and vitamin D daily. - Continue weightbearing exercises as tolerated. - I recommend that you start receiving PROLIA (denosumab) INJECTIONS every 6 months. Prolia helps to improve bone strength, and is especially helpful in patients who have weak bones due to breast cancer therapy. Potential side effects of Prolia include mild flulike symptoms, kidney problems, low calcium, jaw pain, and leg pain/fractures.  If any of these occur, let us know immediately! Please see the attached handout for other information about Prolia injections. Before we can safely start you on Prolia, you will need to see a dentist for DENTAL CLEARANCE. Please ask your dentist to write in their note whether or not you have been cleared to start Prolia injections. Please ask your dentist to fax that note to our office at [phone].  Fax can be attention #Colbin Jovel PA-C.  MEDICATIONS: - Continue taking anastrozole (breast cancer pill) daily - Continue taking daily vitamin D supplement. - Continue taking over-the-counter calcium supplement 500 mg daily  FOLLOW-UP APPOINTMENT: Office visit in 6  months  ** Thank you for trusting me with your healthcare!  I strive to provide all of my patients with quality care at each visit.  If you receive a survey for this visit, I would be so grateful to you for taking the time to provide feedback.  Thank you in advance!  ~ Dalal Livengood                   Dr. Doreatha Massed   &   Rojelio Brenner, PA-C   - - - - - - - - - - - - - - - - - -    Thank you for choosing [facility] to provide your oncology and hematology care.  To afford each patient quality time with our provider, please arrive at least 15 minutes before your scheduled appointment time.   If you have a lab appointment with the Cancer Center please come in thru the Main Entrance and check in at the main information desk.  You need to re-schedule your appointment should you arrive 10 or more minutes late.  We strive to give you quality time with our providers, and arriving late affects you and other patients whose appointments are after yours.  Also, if you no show three or more times for appointments you may be dismissed from the clinic at the providers discretion.     Again, thank you for choosing [facility].  Our hope is that these requests will decrease the amount of time that you wait before being seen by our physicians.  _____________________________________________________________  Should you have questions after your visit to [facility], please contact our office at [phone] and follow the prompts.  Our office hours are 8:00 a.m. and 4:30 p.m. Monday - Friday.  Please note that voicemails left after 4:00 p.m. may not be returned until the following business day.  We are closed weekends and major holidays.  You do have access to a nurse 24-7, just call the main number to the clinic [phone] and do not press any options, hold on the line and a nurse will answer the phone.    For prescription refill  requests, have your pharmacy contact our office and allow 72 hours.

## 2023-03-19 DIAGNOSIS — R7309 Other abnormal glucose: Secondary | ICD-10-CM | POA: Diagnosis not present

## 2023-03-26 ENCOUNTER — Encounter: Payer: Self-pay | Admitting: Family

## 2023-07-27 ENCOUNTER — Other Ambulatory Visit (HOSPITAL_COMMUNITY): Payer: Self-pay | Admitting: Physician Assistant

## 2023-07-27 DIAGNOSIS — R921 Mammographic calcification found on diagnostic imaging of breast: Secondary | ICD-10-CM

## 2023-08-21 ENCOUNTER — Other Ambulatory Visit: Payer: Self-pay | Admitting: Hematology

## 2023-08-21 DIAGNOSIS — D0511 Intraductal carcinoma in situ of right breast: Secondary | ICD-10-CM

## 2023-09-09 ENCOUNTER — Inpatient Hospital Stay: Payer: Medicare PPO | Attending: Hematology

## 2023-09-09 DIAGNOSIS — Z170 Estrogen receptor positive status [ER+]: Secondary | ICD-10-CM | POA: Diagnosis not present

## 2023-09-09 DIAGNOSIS — M8580 Other specified disorders of bone density and structure, unspecified site: Secondary | ICD-10-CM

## 2023-09-09 DIAGNOSIS — Z807 Family history of other malignant neoplasms of lymphoid, hematopoietic and related tissues: Secondary | ICD-10-CM | POA: Insufficient documentation

## 2023-09-09 DIAGNOSIS — Z800 Family history of malignant neoplasm of digestive organs: Secondary | ICD-10-CM | POA: Insufficient documentation

## 2023-09-09 DIAGNOSIS — D0512 Intraductal carcinoma in situ of left breast: Secondary | ICD-10-CM | POA: Insufficient documentation

## 2023-09-09 DIAGNOSIS — Z87891 Personal history of nicotine dependence: Secondary | ICD-10-CM | POA: Insufficient documentation

## 2023-09-09 DIAGNOSIS — Z79811 Long term (current) use of aromatase inhibitors: Secondary | ICD-10-CM | POA: Diagnosis not present

## 2023-09-09 DIAGNOSIS — Z8041 Family history of malignant neoplasm of ovary: Secondary | ICD-10-CM | POA: Diagnosis not present

## 2023-09-09 DIAGNOSIS — D0511 Intraductal carcinoma in situ of right breast: Secondary | ICD-10-CM

## 2023-09-09 DIAGNOSIS — Z803 Family history of malignant neoplasm of breast: Secondary | ICD-10-CM | POA: Insufficient documentation

## 2023-09-09 LAB — COMPREHENSIVE METABOLIC PANEL WITH GFR
ALT: 15 U/L (ref 0–44)
AST: 20 U/L (ref 15–41)
Albumin: 3.9 g/dL (ref 3.5–5.0)
Alkaline Phosphatase: 61 U/L (ref 38–126)
Anion gap: 8 (ref 5–15)
BUN: 17 mg/dL (ref 8–23)
CO2: 26 mmol/L (ref 22–32)
Calcium: 10.3 mg/dL (ref 8.9–10.3)
Chloride: 107 mmol/L (ref 98–111)
Creatinine, Ser: 0.63 mg/dL (ref 0.44–1.00)
GFR, Estimated: >60 mL/min (ref >60)
Glucose, Bld: 93 mg/dL (ref 70–99)
Potassium: 4.1 mmol/L (ref 3.5–5.1)
Sodium: 141 mmol/L (ref 135–145)
Total Bilirubin: 0.8 mg/dL (ref 0.0–1.2)
Total Protein: 6.9 g/dL (ref 6.5–8.1)

## 2023-09-09 LAB — CBC WITH DIFFERENTIAL/PLATELET
Abs Immature Granulocytes: 0.01 10*3/uL (ref 0.00–0.07)
Basophils Absolute: 0 10*3/uL (ref 0.0–0.1)
Basophils Relative: 1 %
Eosinophils Absolute: 0.2 10*3/uL (ref 0.0–0.5)
Eosinophils Relative: 5 %
HCT: 41.3 % (ref 36.0–46.0)
Hemoglobin: 13.1 g/dL (ref 12.0–15.0)
Immature Granulocytes: 0 %
Lymphocytes Relative: 30 %
Lymphs Abs: 1.2 10*3/uL (ref 0.7–4.0)
MCH: 27.7 pg (ref 26.0–34.0)
MCHC: 31.7 g/dL (ref 30.0–36.0)
MCV: 87.3 fL (ref 80.0–100.0)
Monocytes Absolute: 0.6 10*3/uL (ref 0.1–1.0)
Monocytes Relative: 14 %
Neutro Abs: 2 10*3/uL (ref 1.7–7.7)
Neutrophils Relative %: 50 %
Platelets: 148 10*3/uL — ABNORMAL LOW (ref 150–400)
RBC: 4.73 MIL/uL (ref 3.87–5.11)
RDW: 13.3 % (ref 11.5–15.5)
WBC: 4 10*3/uL (ref 4.0–10.5)
nRBC: 0 % (ref 0.0–0.2)

## 2023-09-09 LAB — VITAMIN D 25 HYDROXY (VIT D DEFICIENCY, FRACTURES): Vit D, 25-Hydroxy: 41.04 ng/mL (ref 30–100)

## 2023-09-15 ENCOUNTER — Ambulatory Visit (HOSPITAL_COMMUNITY)
Admission: RE | Admit: 2023-09-15 | Discharge: 2023-09-15 | Disposition: A | Discharge status: Home / Self Care | Source: Ambulatory Visit | Attending: Physician Assistant | Admitting: Physician Assistant

## 2023-09-15 DIAGNOSIS — D0512 Intraductal carcinoma in situ of left breast: Secondary | ICD-10-CM | POA: Diagnosis not present

## 2023-09-15 DIAGNOSIS — R921 Mammographic calcification found on diagnostic imaging of breast: Secondary | ICD-10-CM | POA: Diagnosis not present

## 2023-09-15 DIAGNOSIS — R92322 Mammographic fibroglandular density, left breast: Secondary | ICD-10-CM | POA: Diagnosis not present

## 2023-09-15 NOTE — Progress Notes (Signed)
 [facility] [address] [place], [place] [address]   CLINIC:  Medical Oncology/Hematology  PCP:  Minus Amel, MD [address] / [place] [address] [phone]   REASON FOR VISIT:  Follow-up for history of right breast DCIS   PRIOR THERAPY: Right partial mastectomy (03/09/2019) + radiation therapy completed in [facility] (February 2021)   CURRENT THERAPY: Anastrozole  since February 2021  BRIEF ONCOLOGIC HISTORY:   Oncology History  Ductal carcinoma in situ (DCIS) of right breast  03/09/2019 Initial Diagnosis   Ductal carcinoma in situ (DCIS) of right breast   04/05/2019 Cancer Staging   Staging form: Breast, AJCC 8th Edition - Clinical stage from 04/05/2019: Stage 0 (cTis (DCIS), cN0, cM0, ER+, PR+, HER2: Not Assessed) - Signed by Paulett Boros, MD on 04/05/2019     CANCER STAGING:  Cancer Staging  Ductal carcinoma in situ (DCIS) of right breast Staging form: Breast, AJCC 8th Edition - Clinical stage from 04/05/2019: Stage 0 (cTis (DCIS), cN0, cM0, ER+, PR+, HER2: Not Assessed) - Signed by Paulett Boros, MD on 04/05/2019 - Pathologic: No stage assigned - Unsigned   INTERVAL HISTORY:   [name], a 73 y.o. female, returns for routine follow-up of her history of right breast DCIS. Annalynne was last seen on 03/18/2023 by Sheril Dines PA-C.   At today's visit, she  reports feeling fairly well. She denies any recent hospitalizations, surgeries, or changes in her  baseline health status.  She denies any new breast lumps or axillary lymphadenopathy. She has some chronic knee pain, but denies any new onset bone pain, chest pain, dyspnea, or abdominal pain.    She has no new headaches, seizures, or focal neurologic deficits. No B symptoms such as fever, chills, night sweats, unintentional weight loss.   She continues to take anastrozole , and is tolerating it fairly well with occasional hot flashes and no significant arthralgias.   She is taking vitamin D  and calcium .  She reports 75% energy and 50% appetite.  She is maintaining stable weight at this time.  ASSESSMENT & PLAN:  1.  Intermediate grade RIGHT breast DCIS (2020) - Screening mammogram on 01/31/2019 was B RADS category 0.  Additional views of the right breast showed intermediate 0.4 cm mass within the upper outer right breast which is new. - Right breast biopsy at 10 o'clock position on 02/15/2019 consistent with DCIS, intermediate grade, ER/PR positive. - Right partial mastectomy on 03/09/2019 showing 0.6 cm involving an intraductal papilloma, intermediate grade, in situ carcinoma less than 1 mm from inferior margin, not on ink.  Other margins negative.  Right inferior margin excision shows low-grade DCIS, 0.6 cm.  Not completely clear if it is a 2 foci or DCIS. - Radiation therapy completed in [place] in February 2021. - She has been on anastrozole  since February 2021.  She is tolerating it well with occasional hot flashes and no significant arthralgias.  Goal is 5 years of treatment (through February 2026) in the setting of DCIS. - Area of tissue thickening and nodularity at inferior margin of right breast (6 o'clock position) noted on exam on 09/10/2022, along with unchanged right breast lymphedema in the lower outer quadrant and right breast upper outer quadrant scar unchanged. Right breast diagnostic mammogram (09/23/2022): BI-RADS Category 2, benign (stable lumpectomy changes in right breast) - Bilateral screening mammogram on 02/25/2023 that was BI-RADS Category 0 due to left breast calcifications warranting further evaluation.   Diagnostic mammogram of left breast (03/10/2023) showed indeterminate 1.5  cm group of amorphous calcifications in upper outer posterior left breast as well as indeterminate 0.5 cm group of coarse heterogeneous calcifications in upper outer left breast. Stereotactic guided core biopsy of these 2 groups of calcifications on  03/16/2023. Pathology resulted on 03/17/2023 was benign (fibroadenomatoid change with dystrophic calcifications, negative for carcinoma). - Diagnostic mammogram LEFT breast (09/15/2023) obtained for 64-month follow-up of fibroadenomatoid changes with dystrophic calcifications: Previously biopsied left upper outer quadrant calcifications unchanged.  BI-RADS Category 3, probably benign. - On exam today (09/16/23): RIGHT BREAST: Stable area of tissue thickening and nodularity at the inferior margin of the right breast at approximately 6-7 o'clock position - this is stable compared to prior exam (see above).  Unchanged right breast lymphedema in lower outer quadrant and right breast upper outer quadrant scar unchanged. LEFT BREAST: Nodularity of left breast in the lower outer quadrant.  Diagnostic mammogram from yesterday is reassuring. LYMPH NODES: No axillary, pectoral, or epitrochlear lymphadenopathy bilaterally. - Patient history/ROS today negative for any red flag symptoms of recurrence - Most recent labs (09/09/2023) show normal LFTs and unremarkable CBC. - Bilateral breast tenderness likely related to anastrozole . - PLAN: Labs in 6 months (CBC/D, CMP, vitamin D ) with OFFICE visit 1 week after. - Continue daily anastrozole   - Bilateral DIAGNOSTIC mammogram due November 2025, with magnification views of LEFT breast - No indication for BCI testing in the setting of DCIS.   2.  Bone density - DEXA scan on 04/15/2019 with T-score - 0.7 - BONE DENSITY SCAN (09/23/2022) showed significantly worsened bone density, with T-score -2.3  FRAX = probability of major osteoporotic fracture 8.9% within the next 10 years.  Probability of hip fracture 1.9% within the next 10 years. - Most recent vitamin D  (09/09/2023) normal at 41.04.  Calcium  10.3. - She is taking calcium  and vitamin D  supplement daily.   - PLAN: Extensive discussion regarding risks/benefits of Prolia.  Have strongly recommended Prolia due to  rapid deterioration of bone density in the setting of anastrozole .  Patient declines Prolia and other offered treatments (Fosamax) at this time. - Patient reports that she is trying to increase weightbearing exercises and will continue calcium  and vitamin D . - Repeat DEXA/bone density scan due June 2026   3.  Family history - Patient has 2 sisters with breast cancer - Patient's father had lymphoma - Patient's brother had prostate cancer - Patient's niece had ovarian cancer - Paternal aunt had stomach cancer - PLAN: Patient declined referral to genetic counselor - she does not have children and reports she will just pray for the best in regards to her own health  PLAN SUMMARY:  >> Bilateral diagnostic mammogram in November 2025  >> Labs in 6 months = CBC/D, CMP, vitamin D  >> OFFICE visit in 6 months     REVIEW OF SYSTEMS:  Review of Systems  Constitutional:  Negative for appetite change, chills, diaphoresis, fatigue, fever and unexpected weight change.  HENT:   Negative for lump/mass and nosebleeds.   Eyes:  Negative for eye problems.  Respiratory:  Negative for cough, hemoptysis and shortness of breath.   Cardiovascular:  Negative for chest pain, leg swelling and palpitations.  Gastrointestinal:  Negative for abdominal pain, blood in stool, constipation, diarrhea, nausea and vomiting.  Genitourinary:  Negative for hematuria.   Skin: Negative.   Neurological:  Positive for headaches and numbness. Negative for dizziness and light-headedness.  Hematological:  Does not bruise/bleed easily.    PHYSICAL EXAM:  Performance status (ECOG): 1 - Symptomatic  but completely ambulatory  There were no vitals filed for this visit. Wt Readings from Last 3 Encounters:  03/18/23 193 lb 9.6 oz (87.8 kg)  02/12/23 196 lb (88.9 kg)  01/07/23 202 lb 8 oz (91.9 kg)   Physical Exam Constitutional:      Appearance: Normal appearance. She is obese.   Cardiovascular:     Rate and Rhythm: Normal  rate and regular rhythm.     Pulses: Normal pulses.     Heart sounds: Normal heart sounds.  Pulmonary:     Effort: Pulmonary effort is normal.     Breath sounds: Normal breath sounds.  Chest:  Breasts:    Right: Swelling and tenderness present.     Left: Tenderness present.      Comments: RIGHT BREAST:  Area of tissue thickening and nodularity at the inferior margin of the right breast at approximately 6 o'clock position.  This was also noted on previous exam and appears stable.  Unchanged right breast lymphedema in lower outer quadrant and right breast upper outer quadrant scar unchanged.  - LEFT BREAST: Nodularity of left breast in the lower outer quadrant.  Diagnostic mammogram from yesterday is reassuring.  - No axillary, pectoral, or epitrochlear lymphadenopathy bilaterally. Abdominal:     General: Bowel sounds are normal.  Lymphadenopathy:     Upper Body:     Right upper body: No supraclavicular, axillary or pectoral adenopathy.     Left upper body: No supraclavicular, axillary or pectoral adenopathy.   Skin:    General: Skin is warm and dry.   Neurological:     General: No focal deficit present.     Mental Status: She is alert and oriented to person, place, and time.   Psychiatric:        Mood and Affect: Mood normal.        Behavior: Behavior normal.      PAST MEDICAL/SURGICAL HISTORY:  Past Medical History:  Diagnosis Date   Allergy    Anesthesia complication    tachycardia   Breast cancer (HCC) 2020   dcis   Complication of anesthesia    blood pressure drops quickly with anesthetic   HTN (hypertension)    Obesity    Personal history of radiation therapy 2020   Past Surgical History:  Procedure Laterality Date   ABDOMINAL HYSTERECTOMY  1986   complete, uterine fibroids   BREAST BIOPSY Right 2020   Intermediate grade ductal carcinoma in situ partially involving an  intraductal papilloma   BREAST BIOPSY Left 03/16/2023   MM LT BREAST BX W LOC DEV EA AD  LESION IMG BX SPEC STEREO GUIDE 03/16/2023 [facility]   BREAST BIOPSY Left 03/16/2023   MM LT BREAST BX W LOC DEV 1ST LESION IMAGE BX SPEC STEREO GUIDE 03/16/2023 [facility]   COLONOSCOPY  09/19/2011   Procedure: COLONOSCOPY;  Surgeon: Alyce Jubilee, MD;  Location: [facility];  Service: Endoscopy;  Laterality: N/A;  12:30   COLONOSCOPY WITH PROPOFOL  N/A 10/31/2021   Procedure: COLONOSCOPY WITH PROPOFOL ;  Surgeon: Vinetta Greening, DO;  Location: [facility];  Service: Endoscopy;  Laterality: N/A;  950   PARTIAL MASTECTOMY WITH NEEDLE LOCALIZATION Right 03/09/2019   Procedure: PARTIAL MASTECTOMY WITH NEEDLE LOCALIZATION;  Surgeon: Alanda Allegra, MD;  Location: [facility];  Service: General;  Laterality: Right;   POLYPECTOMY  10/31/2021   Procedure: POLYPECTOMY;  Surgeon: Vinetta Greening, DO;  Location: [facility];  Service: Endoscopy;;    SOCIAL HISTORY:  Social History   Socioeconomic History   Marital status: Single    Spouse name: Not on file   Number of children: 0   Years of education: Not on file   Highest education level: Not on file  Occupational History   Occupation: [employer]    Employer: [employer]  Tobacco Use   Smoking status: Former    Current packs/day: 0.00    Average packs/day: 1 pack/day for 10.0 years (10.0 ttl pk-yrs)    Types: Cigarettes    Start date: 09/10/1981    Quit date: 09/11/1991    Years since quitting: 32.0   Smokeless tobacco: Never   Tobacco comments:    quit about 20 + years  Vaping Use   Vaping status: Never Used  Substance and Sexual Activity   Alcohol use: No   Drug use: No   Sexual activity: Not Currently    Birth control/protection: Surgical    Comment: hyst  Other Topics Concern   Not on file  Social History Narrative   Lives w/ mother   Social Drivers of Health   Financial Resource Strain: Low Risk  (08/13/2021)   Overall Financial Resource Strain (CARDIA)     Difficulty of Paying Living Expenses: Not hard at all  Food Insecurity: No Food Insecurity (08/13/2021)   Hunger Vital Sign    Worried About Running Out of Food in the Last Year: Never true    Ran Out of Food in the Last Year: Never true  Transportation Needs: No Transportation Needs (08/13/2021)   PRAPARE - Administrator, Civil Service (Medical): No    Lack of Transportation (Non-Medical): No  Physical Activity: Sufficiently Active (08/13/2021)   Exercise Vital Sign    Days of Exercise per Week: 7 days    Minutes of Exercise per Session: 100 min  Stress: Stress Concern Present (08/13/2021)   [employer] of Occupational Health - Occupational Stress Questionnaire    Feeling of Stress : To some extent  Social Connections: Moderately Integrated (08/13/2021)   Social Connection and Isolation Panel    Frequency of Communication with Friends and Family: More than three times a week    Frequency of Social Gatherings with Friends and Family: Once a week    Attends Religious Services: More than 4 times per year    Active Member of [employer] or Organizations: Yes    Attends Engineer, structural: More than 4 times per year    Marital Status: Never married  Intimate Partner Violence: Not At Risk (08/13/2021)   Humiliation, Afraid, Rape, and Kick questionnaire    Fear of Current or Ex-Partner: No    Emotionally Abused: No    Physically Abused: No    Sexually Abused: No    FAMILY HISTORY:  Family History  Problem Relation Age of Onset   Ulcers Brother    Colon polyps Mother 67   Congestive Heart Failure Mother    Lymphoma Father    Hypertension Sister    Breast cancer Sister    Hypertension Sister    Breast cancer Sister    Hypertension Sister    Hypertension Sister     CURRENT MEDICATIONS:  Current Outpatient Medications  Medication Sig Dispense Refill   anastrozole  (ARIMIDEX ) 1 MG tablet Take 1 tablet by mouth once daily 90 tablet 0   aspirin EC 81 MG tablet  Take 81 mg by mouth every other day.     Calcium  500 MG tablet Take  1 tablet (500 mg total) by mouth daily. 90 tablet 3   Cholecalciferol (VITAMIN D3 PO) Take 1 tablet by mouth daily.     clopidogrel (PLAVIX) 75 MG tablet Take 75 mg by mouth daily.     furosemide (LASIX) 20 MG tablet Take 20 mg by mouth daily as needed for fluid or edema.     losartan-hydrochlorothiazide  (HYZAAR) 50-12.5 MG tablet Take 1 tablet by mouth daily.     Menthol, Topical Analgesic, (BIOFREEZE) 10 % CREA Apply 1 Application topically daily as needed (arthritis pain).     rosuvastatin (CRESTOR) 10 MG tablet Take 10 mg by mouth daily.     No current facility-administered medications for this visit.    ALLERGIES:  Allergies  Allergen Reactions   Anesthetics, Amide Palpitations    Pt reports Tachycardia w/ HRs 170's with General Anesthesia but cannot give details regarding names of medications.   Tylox [Oxycodone-Acetaminophen ] Palpitations    Tachycardia    Iodinated Contrast Media Itching    Pt began itching after Omnipaque  injection.     LABORATORY DATA:  I have reviewed the labs as listed.     Latest Ref Rng & Units 09/09/2023    2:05 PM 03/10/2023    9:23 AM 12/22/2022   12:26 AM  CBC  WBC 4.0 - 10.5 K/uL 4.0  3.8  5.7   Hemoglobin 12.0 - 15.0 g/dL 54.0  98.1  19.1   Hematocrit 36.0 - 46.0 % 41.3  39.6  38.7   Platelets 150 - 400 K/uL 148  187  148       Latest Ref Rng & Units 09/09/2023    2:05 PM 03/10/2023    9:23 AM 12/22/2022   12:26 AM  CMP  Glucose 70 - 99 mg/dL 93  478  295   BUN 8 - 23 mg/dL 17  19  14    Creatinine 0.44 - 1.00 mg/dL 6.21  3.08  6.57   Sodium 135 - 145 mmol/L 141  140  137   Potassium 3.5 - 5.1 mmol/L 4.1  4.1  3.8   Chloride 98 - 111 mmol/L 107  105  101   CO2 22 - 32 mmol/L 26  26  26    Calcium  8.9 - 10.3 mg/dL 84.6  96.2  9.9   Total Protein 6.5 - 8.1 g/dL 6.9  6.7  7.0   Total Bilirubin 0.0 - 1.2 mg/dL 0.8  0.3  0.5   Alkaline Phos 38 - 126 U/L 61  55  59   AST  15 - 41 U/L 20  22  25    ALT 0 - 44 U/L 15  18  21      DIAGNOSTIC IMAGING:  I have independently reviewed the scans and discussed with the patient. MM 3D DIAGNOSTIC MAMMOGRAM UNILATERAL LEFT BREAST Result Date: 09/15/2023 CLINICAL DATA:  73 year old woman with prior history of RIGHT malignant lumpectomy in 2020 (DCIS) underwent 2 site stereotactic guided biopsy of LEFT breast calcifications on 03/16/2023, which showed fibroadenomatoid changes with dystrophic calcifications. Six-month follow-up mammogram of the LEFT breast was recommended. EXAM: DIGITAL DIAGNOSTIC UNILATERAL LEFT MAMMOGRAM WITH TOMOSYNTHESIS AND CAD TECHNIQUE: Left digital diagnostic mammography and breast tomosynthesis was performed. The images were evaluated with computer-aided detection. COMPARISON:  Previous exam(s). ACR Breast Density Category b: There are scattered areas of fibroglandular density. FINDINGS: LEFT: Mammogram: Previously biopsied group of faint amorphous calcifications in the upper outer posterior LEFT breast (X clip) are not significantly changed in morphology or extent. Previously biopsied  group of coarse heterogeneous calcifications in the upper-outer LEFT breast (ribbon clip) are not significantly changed in morphology or extent. No new suspicious mass, distortion, or microcalcifications are identified to suggest presence of LEFT breast malignancy. IMPRESSION: Previously biopsied LEFT upper outer quadrant calcifications are unchanged. RECOMMENDATION: BILATERAL diagnostic mammogram with magnification views of the LEFT breast in November 2025. I have discussed the findings and recommendations with the patient. If applicable, a reminder letter will be sent to the patient regarding the next appointment. BI-RADS CATEGORY  3: Probably benign. Electronically Signed   By: Elester Grim M.D.   On: 09/15/2023 13:06     WRAP UP:  All questions were answered. The patient knows to call the clinic with any problems, questions or  concerns.  Medical decision making: Moderate  Time spent on visit: I spent 20 minutes counseling the patient face to face. The total time spent in the appointment was 30 minutes and more than 50% was on counseling.  Sonnie Dusky, PA-C  09/16/23 2:00 PM

## 2023-09-16 ENCOUNTER — Inpatient Hospital Stay (HOSPITAL_BASED_OUTPATIENT_CLINIC_OR_DEPARTMENT_OTHER): Payer: Medicare PPO | Admitting: Physician Assistant

## 2023-09-16 ENCOUNTER — Other Ambulatory Visit (HOSPITAL_COMMUNITY): Payer: Self-pay | Admitting: Physician Assistant

## 2023-09-16 VITALS — BP 140/83 | HR 93 | Temp 98.0°F | Resp 16 | Wt 191.8 lb

## 2023-09-16 DIAGNOSIS — M8580 Other specified disorders of bone density and structure, unspecified site: Secondary | ICD-10-CM

## 2023-09-16 DIAGNOSIS — Z803 Family history of malignant neoplasm of breast: Secondary | ICD-10-CM | POA: Diagnosis not present

## 2023-09-16 DIAGNOSIS — Z807 Family history of other malignant neoplasms of lymphoid, hematopoietic and related tissues: Secondary | ICD-10-CM | POA: Diagnosis not present

## 2023-09-16 DIAGNOSIS — R921 Mammographic calcification found on diagnostic imaging of breast: Secondary | ICD-10-CM

## 2023-09-16 DIAGNOSIS — D0511 Intraductal carcinoma in situ of right breast: Secondary | ICD-10-CM | POA: Diagnosis not present

## 2023-09-16 DIAGNOSIS — Z79811 Long term (current) use of aromatase inhibitors: Secondary | ICD-10-CM | POA: Diagnosis not present

## 2023-09-16 DIAGNOSIS — D0512 Intraductal carcinoma in situ of left breast: Secondary | ICD-10-CM | POA: Diagnosis not present

## 2023-09-16 DIAGNOSIS — Z170 Estrogen receptor positive status [ER+]: Secondary | ICD-10-CM | POA: Diagnosis not present

## 2023-09-16 DIAGNOSIS — Z8041 Family history of malignant neoplasm of ovary: Secondary | ICD-10-CM | POA: Diagnosis not present

## 2023-09-16 DIAGNOSIS — Z800 Family history of malignant neoplasm of digestive organs: Secondary | ICD-10-CM | POA: Diagnosis not present

## 2023-09-16 DIAGNOSIS — Z87891 Personal history of nicotine dependence: Secondary | ICD-10-CM | POA: Diagnosis not present

## 2023-09-16 NOTE — Patient Instructions (Addendum)
 [facility] at [facility] **VISIT SUMMARY & IMPORTANT INSTRUCTIONS **   You were seen today by Sheril Dines PA-C for your history of right-sided breast cancer.   Your most recent labs, mammogram, and biopsy were negative for any cancer. You did not have any new abnormalities on your physical exam today. Your next mammogram is due in November 2025. We will plan on rechecking labs and seeing you for office visit in 6 months.  BONE DENSITY: You have OSTEOPENIA, which means that your bones are more fragile and likely to break. - Your bone density scan from June 2024 shows that your bone density has gotten much worse in the past 3 years.  This is likely a side effect from your breast cancer pill. - Having low bone density it means that you are at increased risk of broken bones.   - It is important that you continue taking calcium  and vitamin D  daily. - Continue weightbearing exercises as tolerated. - I recommend that you start receiving PROLIA (denosumab) INJECTIONS every 6 months. Prolia helps to improve bone strength, and is especially helpful in patients who have weak bones due to breast cancer therapy. Potential side effects of Prolia include mild flulike symptoms, kidney problems, low calcium , jaw pain, and leg pain/fractures.  If any of these occur, let us  know immediately! Please see the attached handout for other information about Prolia injections. Before we can safely start you on Prolia, you will need to see a dentist for DENTAL CLEARANCE. Please ask your dentist to write in their note whether or not you have been cleared to start Prolia injections. Please ask your dentist to fax that note to our office at [phone].  Fax can be attention #Sarayu Prevost PA-C.  MEDICATIONS: - Continue taking anastrozole  (breast cancer pill) daily - Continue taking daily vitamin D  supplement. - Continue taking over-the-counter calcium  supplement 500 mg  daily  FOLLOW-UP APPOINTMENT: Office visit in 6 months  ** Thank you for trusting me with your healthcare!  I strive to provide all of my patients with quality care at each visit.  If you receive a survey for this visit, I would be so grateful to you for taking the time to provide feedback.  Thank you in advance!  ~ Leyana Whidden                   Dr. Paulett Boros   &   Sheril Dines, PA-C   - - - - - - - - - - - - - - - - - -    Thank you for choosing [facility] at [facility] to provide your oncology and hematology care.  To afford each patient quality time with our provider, please arrive at least 15 minutes before your scheduled appointment time.   If you have a lab appointment with the Cancer Center please come in thru the Main Entrance and check in at the main information desk.  You need to re-schedule your appointment should you arrive 10 or more minutes late.  We strive to give you quality time with our providers, and arriving late affects you and other patients whose appointments are after yours.  Also, if you no show three or more times for appointments you may be dismissed from the clinic at the providers discretion.     Again, thank you for choosing [facility].  Our hope is that these requests will decrease the amount of time that  you wait before being seen by our physicians.       _____________________________________________________________  Should you have questions after your visit to [facility], please contact our office at [phone] and follow the prompts.  Our office hours are 8:00 a.m. and 4:30 p.m. Monday - Friday.  Please note that voicemails left after 4:00 p.m. may not be returned until the following business day.  We are closed weekends and major holidays.  You do have access to a nurse 24-7, just call the main number to the clinic [phone] and do not press any options, hold on the line and a nurse will  answer the phone.    For prescription refill requests, have your pharmacy contact our office and allow 72 hours.

## 2023-10-06 ENCOUNTER — Other Ambulatory Visit: Payer: Self-pay | Admitting: Hematology

## 2023-10-06 DIAGNOSIS — D0511 Intraductal carcinoma in situ of right breast: Secondary | ICD-10-CM

## 2024-01-11 ENCOUNTER — Inpatient Hospital Stay (HOSPITAL_COMMUNITY)

## 2024-01-11 ENCOUNTER — Emergency Department (HOSPITAL_COMMUNITY): Admitting: Anesthesiology

## 2024-01-11 ENCOUNTER — Encounter (HOSPITAL_COMMUNITY): Admission: EM | Disposition: A | Payer: Self-pay | Source: Home / Self Care | Attending: General Surgery

## 2024-01-11 ENCOUNTER — Inpatient Hospital Stay (HOSPITAL_COMMUNITY)
Admission: EM | Admit: 2024-01-11 | Discharge: 2024-01-14 | DRG: 354 | Disposition: A | Discharge status: Home / Self Care | Attending: General Surgery | Admitting: General Surgery

## 2024-01-11 ENCOUNTER — Emergency Department (HOSPITAL_COMMUNITY)

## 2024-01-11 ENCOUNTER — Encounter (HOSPITAL_COMMUNITY): Payer: Self-pay

## 2024-01-11 ENCOUNTER — Other Ambulatory Visit: Payer: Self-pay

## 2024-01-11 DIAGNOSIS — Z853 Personal history of malignant neoplasm of breast: Secondary | ICD-10-CM | POA: Diagnosis not present

## 2024-01-11 DIAGNOSIS — Z9079 Acquired absence of other genital organ(s): Secondary | ICD-10-CM | POA: Diagnosis not present

## 2024-01-11 DIAGNOSIS — Z9011 Acquired absence of right breast and nipple: Secondary | ICD-10-CM | POA: Diagnosis not present

## 2024-01-11 DIAGNOSIS — Z79811 Long term (current) use of aromatase inhibitors: Secondary | ICD-10-CM

## 2024-01-11 DIAGNOSIS — Z923 Personal history of irradiation: Secondary | ICD-10-CM | POA: Diagnosis not present

## 2024-01-11 DIAGNOSIS — Z884 Allergy status to anesthetic agent status: Secondary | ICD-10-CM

## 2024-01-11 DIAGNOSIS — E669 Obesity, unspecified: Secondary | ICD-10-CM | POA: Diagnosis present

## 2024-01-11 DIAGNOSIS — I10 Essential (primary) hypertension: Secondary | ICD-10-CM | POA: Diagnosis present

## 2024-01-11 DIAGNOSIS — Z87891 Personal history of nicotine dependence: Secondary | ICD-10-CM

## 2024-01-11 DIAGNOSIS — Z6834 Body mass index (BMI) 34.0-34.9, adult: Secondary | ICD-10-CM

## 2024-01-11 DIAGNOSIS — Z7902 Long term (current) use of antithrombotics/antiplatelets: Secondary | ICD-10-CM | POA: Diagnosis not present

## 2024-01-11 DIAGNOSIS — Z888 Allergy status to other drugs, medicaments and biological substances status: Secondary | ICD-10-CM | POA: Diagnosis not present

## 2024-01-11 DIAGNOSIS — K460 Unspecified abdominal hernia with obstruction, without gangrene: Secondary | ICD-10-CM

## 2024-01-11 DIAGNOSIS — Z8249 Family history of ischemic heart disease and other diseases of the circulatory system: Secondary | ICD-10-CM | POA: Diagnosis not present

## 2024-01-11 DIAGNOSIS — K420 Umbilical hernia with obstruction, without gangrene: Secondary | ICD-10-CM | POA: Diagnosis present

## 2024-01-11 DIAGNOSIS — Z7901 Long term (current) use of anticoagulants: Secondary | ICD-10-CM

## 2024-01-11 DIAGNOSIS — J9811 Atelectasis: Secondary | ICD-10-CM | POA: Diagnosis present

## 2024-01-11 DIAGNOSIS — Z90710 Acquired absence of both cervix and uterus: Secondary | ICD-10-CM | POA: Diagnosis not present

## 2024-01-11 DIAGNOSIS — Z79899 Other long term (current) drug therapy: Secondary | ICD-10-CM | POA: Diagnosis not present

## 2024-01-11 DIAGNOSIS — C50511 Malignant neoplasm of lower-outer quadrant of right female breast: Secondary | ICD-10-CM | POA: Diagnosis present

## 2024-01-11 DIAGNOSIS — Z91041 Radiographic dye allergy status: Secondary | ICD-10-CM | POA: Diagnosis not present

## 2024-01-11 DIAGNOSIS — R000 Tachycardia, unspecified: Secondary | ICD-10-CM | POA: Diagnosis present

## 2024-01-11 DIAGNOSIS — K469 Unspecified abdominal hernia without obstruction or gangrene: Secondary | ICD-10-CM | POA: Diagnosis not present

## 2024-01-11 HISTORY — PX: LAPAROTOMY: SHX154

## 2024-01-11 LAB — URINALYSIS, ROUTINE W REFLEX MICROSCOPIC
Bacteria, UA: NONE SEEN
Bilirubin Urine: NEGATIVE
Glucose, UA: NEGATIVE mg/dL
Hgb urine dipstick: NEGATIVE
Ketones, ur: 20 mg/dL — AB
Leukocytes,Ua: NEGATIVE
Nitrite: NEGATIVE
Protein, ur: 30 mg/dL — AB
Specific Gravity, Urine: 1.027 (ref 1.005–1.030)
pH: 6 (ref 5.0–8.0)

## 2024-01-11 LAB — LACTIC ACID, PLASMA
Lactic Acid, Venous: 2 mmol/L (ref 0.5–1.9)
Lactic Acid, Venous: 1.4 mmol/L (ref 0.5–1.9)

## 2024-01-11 LAB — CBC WITH DIFFERENTIAL/PLATELET
Abs Immature Granulocytes: 0.03 K/uL (ref 0.00–0.07)
Basophils Absolute: 0 K/uL (ref 0.0–0.1)
Basophils Relative: 0 %
Eosinophils Absolute: 0 K/uL (ref 0.0–0.5)
Eosinophils Relative: 0 %
HCT: 43.6 % (ref 36.0–46.0)
Hemoglobin: 14.4 g/dL (ref 12.0–15.0)
Immature Granulocytes: 0 %
Lymphocytes Relative: 8 %
Lymphs Abs: 0.8 K/uL (ref 0.7–4.0)
MCH: 28.5 pg (ref 26.0–34.0)
MCHC: 33 g/dL (ref 30.0–36.0)
MCV: 86.3 fL (ref 80.0–100.0)
Monocytes Absolute: 0.8 K/uL (ref 0.1–1.0)
Monocytes Relative: 7 %
Neutro Abs: 8.6 K/uL — ABNORMAL HIGH (ref 1.7–7.7)
Neutrophils Relative %: 85 %
Platelets: 181 K/uL (ref 150–400)
RBC: 5.05 MIL/uL (ref 3.87–5.11)
RDW: 13 % (ref 11.5–15.5)
WBC: 10.2 K/uL (ref 4.0–10.5)
nRBC: 0 % (ref 0.0–0.2)

## 2024-01-11 LAB — COMPREHENSIVE METABOLIC PANEL WITH GFR
ALT: 16 U/L (ref 0–44)
AST: 28 U/L (ref 15–41)
Albumin: 4.5 g/dL (ref 3.5–5.0)
Alkaline Phosphatase: 73 U/L (ref 38–126)
Anion gap: 11 (ref 5–15)
BUN: 15 mg/dL (ref 8–23)
CO2: 27 mmol/L (ref 22–32)
Calcium: 11.5 mg/dL — ABNORMAL HIGH (ref 8.9–10.3)
Chloride: 98 mmol/L (ref 98–111)
Creatinine, Ser: 0.78 mg/dL (ref 0.44–1.00)
GFR, Estimated: >60 mL/min (ref >60)
Glucose, Bld: 135 mg/dL — ABNORMAL HIGH (ref 70–99)
Potassium: 4.6 mmol/L (ref 3.5–5.1)
Sodium: 136 mmol/L (ref 135–145)
Total Bilirubin: 0.9 mg/dL (ref 0.0–1.2)
Total Protein: 7.7 g/dL (ref 6.5–8.1)

## 2024-01-11 LAB — LIPASE, BLOOD: Lipase: 16 U/L (ref 11–51)

## 2024-01-11 SURGERY — LAPAROTOMY, EXPLORATORY
Anesthesia: General | Site: Abdomen

## 2024-01-11 MED ORDER — LACTATED RINGERS IV SOLN: INTRAVENOUS | Status: DC

## 2024-01-11 MED ORDER — CHLORHEXIDINE GLUCONATE CLOTH 2 % EX PADS: 6.0000 | MEDICATED_PAD | Freq: Once | CUTANEOUS | Status: DC

## 2024-01-11 MED ORDER — METHOCARBAMOL 500 MG PO TABS: 500.0000 mg | ORAL_TABLET | Freq: Four times a day (QID) | ORAL | Status: DC | PRN

## 2024-01-11 MED ORDER — ONDANSETRON HCL 4 MG/2ML IJ SOLN
INTRAMUSCULAR | Status: DC | PRN
Start: 2024-01-11 — End: 2024-01-11
  Administered 2024-01-11: 4 mg via INTRAVENOUS

## 2024-01-11 MED ORDER — PHENYLEPHRINE 80 MCG/ML (10ML) SYRINGE FOR IV PUSH (FOR BLOOD PRESSURE SUPPORT)
PREFILLED_SYRINGE | INTRAVENOUS | Status: DC | PRN
  Administered 2024-01-11 (×2): 80 ug via INTRAVENOUS

## 2024-01-11 MED ORDER — ROCURONIUM BROMIDE 10 MG/ML (PF) SYRINGE
PREFILLED_SYRINGE | INTRAVENOUS | Status: DC | PRN
Start: 2024-01-11 — End: 2024-01-11
  Administered 2024-01-11: 60 mg via INTRAVENOUS

## 2024-01-11 MED ORDER — PHENOL 1.4 % MT LIQD
1.0000 | OROMUCOSAL | Status: DC | PRN
  Administered 2024-01-11: 1 via OROMUCOSAL
  Filled 2024-01-11: qty 177

## 2024-01-11 MED ORDER — ONDANSETRON 4 MG PO TBDP: 4.0000 mg | ORAL_TABLET | Freq: Four times a day (QID) | ORAL | Status: DC | PRN

## 2024-01-11 MED ORDER — FENTANYL CITRATE (PF) 100 MCG/2ML IJ SOLN
INTRAMUSCULAR | Status: DC | PRN
  Administered 2024-01-11 (×2): 100 ug via INTRAVENOUS

## 2024-01-11 MED ORDER — SUCCINYLCHOLINE CHLORIDE 200 MG/10ML IV SOSY
PREFILLED_SYRINGE | INTRAVENOUS | Status: DC | PRN
  Administered 2024-01-11: 120 mg via INTRAVENOUS

## 2024-01-11 MED ORDER — FENTANYL CITRATE (PF) 50 MCG/ML IJ SOSY: 25.0000 ug | PREFILLED_SYRINGE | INTRAMUSCULAR | Status: DC | PRN

## 2024-01-11 MED ORDER — MIDAZOLAM HCL 2 MG/2ML IJ SOLN
INTRAMUSCULAR | Status: DC | PRN
  Administered 2024-01-11: 2 mg via INTRAVENOUS

## 2024-01-11 MED ORDER — SODIUM CHLORIDE 0.9 % IV BOLUS
1000.0000 mL | Freq: Once | INTRAVENOUS | Status: AC
  Administered 2024-01-11: 1000 mL via INTRAVENOUS

## 2024-01-11 MED ORDER — SUGAMMADEX SODIUM 200 MG/2ML IV SOLN
INTRAVENOUS | Status: DC | PRN
  Administered 2024-01-11: 200 mg via INTRAVENOUS

## 2024-01-11 MED ORDER — SODIUM CHLORIDE 0.9 % IV SOLN
2.0000 g | INTRAVENOUS | Status: AC
  Administered 2024-01-11: 2 g via INTRAVENOUS
  Filled 2024-01-11: qty 2

## 2024-01-11 MED ORDER — SIMETHICONE 80 MG PO CHEW: 40.0000 mg | CHEWABLE_TABLET | Freq: Four times a day (QID) | ORAL | Status: DC | PRN

## 2024-01-11 MED ORDER — DIPHENHYDRAMINE HCL 12.5 MG/5ML PO ELIX: 12.5000 mg | ORAL_SOLUTION | Freq: Four times a day (QID) | ORAL | Status: DC | PRN

## 2024-01-11 MED ORDER — CHLORHEXIDINE GLUCONATE 0.12 % MT SOLN: 15.0000 mL | Freq: Once | OROMUCOSAL | Status: DC

## 2024-01-11 MED ORDER — ONDANSETRON HCL 4 MG/2ML IJ SOLN: 4.0000 mg | Freq: Four times a day (QID) | INTRAMUSCULAR | Status: DC | PRN

## 2024-01-11 MED ORDER — METOPROLOL TARTRATE 5 MG/5ML IV SOLN: 5.0000 mg | Freq: Four times a day (QID) | INTRAVENOUS | Status: DC | PRN

## 2024-01-11 MED ORDER — PANTOPRAZOLE SODIUM 40 MG IV SOLR
40.0000 mg | Freq: Every day | INTRAVENOUS | Status: DC
Start: 2024-01-11 — End: 2024-01-14
  Administered 2024-01-11 – 2024-01-13 (×3): 40 mg via INTRAVENOUS
  Filled 2024-01-11 (×3): qty 10

## 2024-01-11 MED ORDER — CEFAZOLIN SODIUM-DEXTROSE 2-4 GM/100ML-% IV SOLN
INTRAVENOUS | Status: AC
  Filled 2024-01-11: qty 100

## 2024-01-11 MED ORDER — SODIUM CHLORIDE 0.9 % IV SOLN: Freq: Once | INTRAVENOUS | Status: DC

## 2024-01-11 MED ORDER — ONDANSETRON HCL 4 MG/2ML IJ SOLN: 4.0000 mg | Freq: Once | INTRAMUSCULAR | Status: DC | PRN

## 2024-01-11 MED ORDER — FENTANYL CITRATE (PF) 100 MCG/2ML IJ SOLN
INTRAMUSCULAR | Status: AC
  Filled 2024-01-11: qty 2

## 2024-01-11 MED ORDER — HYDROCHLOROTHIAZIDE 12.5 MG PO TABS
12.5000 mg | ORAL_TABLET | Freq: Every day | ORAL | Status: DC
  Administered 2024-01-12 – 2024-01-14 (×3): 12.5 mg via ORAL
  Filled 2024-01-11 (×3): qty 1

## 2024-01-11 MED ORDER — ONDANSETRON HCL 4 MG/2ML IJ SOLN
4.0000 mg | Freq: Once | INTRAMUSCULAR | Status: AC
  Administered 2024-01-11: 4 mg via INTRAVENOUS
  Filled 2024-01-11: qty 2

## 2024-01-11 MED ORDER — PROPOFOL 10 MG/ML IV BOLUS
INTRAVENOUS | Status: AC
  Filled 2024-01-11: qty 20

## 2024-01-11 MED ORDER — LOSARTAN POTASSIUM 50 MG PO TABS
50.0000 mg | ORAL_TABLET | Freq: Every day | ORAL | Status: DC
  Administered 2024-01-12 – 2024-01-14 (×3): 50 mg via ORAL
  Filled 2024-01-11 (×3): qty 1

## 2024-01-11 MED ORDER — ROSUVASTATIN CALCIUM 10 MG PO TABS
10.0000 mg | ORAL_TABLET | Freq: Every day | ORAL | Status: DC
  Administered 2024-01-13 – 2024-01-14 (×2): 10 mg via ORAL
  Filled 2024-01-11 (×3): qty 1

## 2024-01-11 MED ORDER — MORPHINE SULFATE (PF) 2 MG/ML IV SOLN
2.0000 mg | INTRAVENOUS | Status: DC | PRN
  Administered 2024-01-12: 2 mg via INTRAVENOUS
  Filled 2024-01-11: qty 1

## 2024-01-11 MED ORDER — LOSARTAN POTASSIUM-HCTZ 50-12.5 MG PO TABS: 1.0000 | ORAL_TABLET | Freq: Every day | ORAL | Status: DC

## 2024-01-11 MED ORDER — ORAL CARE MOUTH RINSE: 15.0000 mL | Freq: Once | OROMUCOSAL | Status: DC

## 2024-01-11 MED ORDER — MIDAZOLAM HCL 2 MG/2ML IJ SOLN
INTRAMUSCULAR | Status: AC
  Filled 2024-01-11: qty 2

## 2024-01-11 MED ORDER — CHLORHEXIDINE GLUCONATE 0.12 % MT SOLN
OROMUCOSAL | Status: AC
  Filled 2024-01-11: qty 15

## 2024-01-11 MED ORDER — SODIUM CHLORIDE 0.9 % IR SOLN
Status: DC | PRN
Start: 2024-01-11 — End: 2024-01-11
  Administered 2024-01-11: 1000 mL

## 2024-01-11 MED ORDER — DIPHENHYDRAMINE HCL 50 MG/ML IJ SOLN: 12.5000 mg | Freq: Four times a day (QID) | INTRAMUSCULAR | Status: DC | PRN

## 2024-01-11 SURGICAL SUPPLY — 32 items
CHLORAPREP W/TINT 26 (MISCELLANEOUS) ×2 IMPLANT
CLOTH BEACON ORANGE TIMEOUT ST (SAFETY) ×2 IMPLANT
COVER LIGHT HANDLE (MISCELLANEOUS) IMPLANT
DRSG OPSITE POSTOP 4X8 (GAUZE/BANDAGES/DRESSINGS) IMPLANT
ELECTRODE REM PT RTRN 9FT ADLT (ELECTROSURGICAL) ×2 IMPLANT
GLOVE BIO SURGEON STRL SZ 6.5 (GLOVE) ×4 IMPLANT
GLOVE BIOGEL PI IND STRL 6.5 (GLOVE) ×2 IMPLANT
GLOVE BIOGEL PI IND STRL 7.0 (GLOVE) ×6 IMPLANT
GOWN STRL REUS W/TWL LRG LVL3 (GOWN DISPOSABLE) ×6 IMPLANT
INST SET MAJOR GENERAL (KITS) ×2 IMPLANT
KIT TURNOVER KIT A (KITS) ×2 IMPLANT
LIGASURE IMPACT 36 18CM CVD LR (INSTRUMENTS) IMPLANT
MANIFOLD NEPTUNE II (INSTRUMENTS) ×2 IMPLANT
MESH VENTRALEX ST 8CM LRG (Mesh General) IMPLANT
NDL HYPO 18GX1.5 BLUNT FILL (NEEDLE) ×2 IMPLANT
NDL HYPO 21X1.5 SAFETY (NEEDLE) ×2 IMPLANT
NEEDLE HYPO 18GX1.5 BLUNT FILL (NEEDLE) ×1 IMPLANT
NEEDLE HYPO 21X1.5 SAFETY (NEEDLE) ×1 IMPLANT
NS IRRIG 1000ML POUR BTL (IV SOLUTION) ×4 IMPLANT
PACK ABDOMINAL MAJOR (CUSTOM PROCEDURE TRAY) ×2 IMPLANT
PAD ARMBOARD POSITIONER FOAM (MISCELLANEOUS) ×2 IMPLANT
PENCIL SMOKE EVACUATOR COATED (MISCELLANEOUS) ×2 IMPLANT
POSITIONER HEAD 8X9X4 ADT (SOFTGOODS) ×2 IMPLANT
SET BASIN LINEN APH (SET/KITS/TRAYS/PACK) ×2 IMPLANT
SPONGE T-LAP 18X18 ~~LOC~~+RFID (SPONGE) ×4 IMPLANT
STAPLER VISISTAT (STAPLE) ×2 IMPLANT
SUT ETHIBOND NAB MO 7 #0 18IN (SUTURE) IMPLANT
SUT PDS AB CT VIOLET #0 27IN (SUTURE) ×4 IMPLANT
SUT SILK 3 0 SH CR/8 (SUTURE) ×2 IMPLANT
SUT VIC AB 3-0 SH 27X BRD (SUTURE) IMPLANT
SYR 30ML LL (SYRINGE) ×4 IMPLANT
TRAY FOLEY MTR SLVR 16FR STAT (SET/KITS/TRAYS/PACK) ×2 IMPLANT

## 2024-01-11 NOTE — Op Note (Signed)
 [facility] Operative Note  01/11/24  Preoperative Diagnosis: Incarcerated ventral hernia with colon, possible ischemic colon   Postoperative Diagnosis: Incarcerated ventral hernia, healthy colon   Procedure(s) Performed: Ventral hernia repair with mesh 8cm Ventralex ST patch, reduction of colon    Surgeon: Manuelita BROCKS. Kallie, MD   Assistants: No qualified resident was available    Anesthesia: General endotracheal   Anesthesiologist: Kendell Yvonna PARAS, MD    Specimens:  None    Estimated Blood Loss: 30cc   Blood Replacement: None    Complications: None   Wound Class: Clean    Operative Indications: Ms. Lefebre is a 73 yo who comes in with an incarcerated hernia with transverse and concern on the CT scan with ischemic colon. We discussed exploration, repair of the hernia, possible bowel resection, possible hernia repair primary versus mesh. Discussed higher risk of bleeding due to the Plavix, risk of infection, needing bowel resection, hernia recurrence.   Findings: Incarcerated hernia with omentum and transverse colon, healthy, reduced, defect 3cm    Procedure: The patient was taken to the operating room and placed supine. General endotracheal anesthesia was induced. Intravenous antibiotics were  administered per protocol.  A nasogastric tube positioned to decompress the stomach. The abdomen was prepped and draped in the usual sterile fashion.   The ventral hernia was noted to be incarcerated. An incision was made in her prior midline celiotomy around her umbilicus. The defect was just inferior and left of the umbilicus.  The incision carried down through the subcutaneous tissue with electrocautery.  Dissection was performed down to the level of the fascia, exposing the hernia sac.  I entered the fascia superiorly and entered the abdomen. She had some adhesions. From here I was able to get my finger under the hernia sac from below and open up the rim of fascia at the  neck of the hernia. I then dissected the hernia sac off, protecting the colon. There was colon and omentum adherent up into the hernia sac. With care, I was able to get the colon reduced, and I ligated omentum from the hernia with a Ligasure.   A finger was ran on the underlying peritoneum and this was clear.  A 8 cm Ventralex St Hernia Patch was placed and secured with 0 Ethibond sutures ensuring that it was against the peritoneal cavity.  The hernia defect was then closed with 0 Ethibond suture in an interrupted fashion over the patch in the vertical direction as the fascia came together without tension or gaping of the mesh.   Hemostasis was confirmed. She had an allergy to amides so I could not give her any local. Her subcutaneous tissue was closed with 3-0 Vicryl and the skin was closed with staples. A honeycomb dressing was placed.   All counts were correct at the end of the case. The patient was awakened from anesthesia and extubated without complication.  The patient went to the PACU in stable condition.  Manuelita Kallie, MD [facility] [address] [place], [place] [address] [phone] (office)    [phone] (office)

## 2024-01-11 NOTE — ED Triage Notes (Signed)
 Pt arrived via POV from home c/o abdominal pain X 2 weeks and reports emesis since yesterday. Pt reports last BM was 3 days ago.

## 2024-01-11 NOTE — Plan of Care (Signed)

## 2024-01-11 NOTE — ED Provider Notes (Signed)
 [place] EMERGENCY DEPARTMENT AT [facility] Provider Note   CSN: [id number] Arrival date & time: 01/11/24  1059     Patient presents with: Abdominal Pain   Chelsey Johnson is a 73 y.o. female.  She is here with a complaint of nausea and vomiting that is been going on and off for the last 2 weeks.  Sometimes has some epigastric abdominal pain.  Still having normal bowel movements.  No fevers or chills.  She said she has tried some nausea medication but vomited up.  She has a known umbilical hernia but she was told that it does not need surgery.  No urinary symptoms fevers or chills.   The history is provided by the patient.  Abdominal Pain Pain quality: cramping   Pain radiates to:  Does not radiate Pain severity:  Moderate Onset quality:  Gradual Duration:  2 weeks Timing:  Intermittent Progression:  Unchanged Chronicity:  Recurrent Relieved by:  Nothing Associated symptoms: nausea and vomiting   Associated symptoms: no chills, no cough, no dysuria, no fever and no hematemesis        Prior to Admission medications   Medication Sig Start Date End Date Taking? Authorizing Provider  anastrozole  (ARIMIDEX ) 1 MG tablet Take 1 tablet by mouth once daily 10/06/23   Katragadda, Sreedhar, MD  Calcium  500 MG tablet Take 1 tablet (500 mg total) by mouth daily. 03/03/22   Lamon Pleasant HERO, PA-C  Cholecalciferol (VITAMIN D3 PO) Take 1 tablet by mouth daily.    [provider]  clopidogrel (PLAVIX) 75 MG tablet Take 75 mg by mouth daily. 02/23/22   [provider]  furosemide (LASIX) 20 MG tablet Take 20 mg by mouth daily as needed for fluid or edema.    [provider]  losartan-hydrochlorothiazide  (HYZAAR) 50-12.5 MG tablet Take 1 tablet by mouth daily.    [provider]  Menthol, Topical Analgesic, (ICY HOT EX) Apply topically.    [provider]  rosuvastatin (CRESTOR) 10 MG tablet Take 10 mg by mouth daily. 12/16/21    [provider]    Allergies: Anesthetics, amide; Tylox [oxycodone-acetaminophen ]; and Iodinated contrast media    Review of Systems  Constitutional:  Negative for chills and fever.  Respiratory:  Negative for cough.   Gastrointestinal:  Positive for abdominal pain, nausea and vomiting. Negative for hematemesis.  Genitourinary:  Negative for dysuria.    Updated Vital Signs BP (!) 146/101 (BP Location: Left Arm)   Pulse (!) 124   Temp 97.8 F (36.6 C)   Resp 19   Ht 5' 2 (1.575 m)   Wt 86.2 kg   SpO2 100%   BMI 34.75 kg/m   Physical Exam Vitals and nursing note reviewed.  Constitutional:      General: She is not in acute distress.    Appearance: Normal appearance. She is well-developed.  HENT:     Head: Normocephalic and atraumatic.  Eyes:     Conjunctiva/sclera: Conjunctivae normal.  Cardiovascular:     Rate and Rhythm: Normal rate and regular rhythm.     Heart sounds: No murmur heard. Pulmonary:     Effort: Pulmonary effort is normal. No respiratory distress.     Breath sounds: Normal breath sounds. No stridor. No wheezing.  Abdominal:     Palpations: Abdomen is soft.     Tenderness: There is no abdominal tenderness. There is no guarding or rebound.     Hernia: A hernia is present. Hernia is present in  the umbilical area.  Musculoskeletal:        General: No tenderness or deformity. Normal range of motion.     Cervical back: Neck supple.  Skin:    General: Skin is warm and dry.  Neurological:     General: No focal deficit present.     Mental Status: She is alert.     GCS: GCS eye subscore is 4. GCS verbal subscore is 5. GCS motor subscore is 6.     (all labs ordered are listed, but only abnormal results are displayed) Labs Reviewed  COMPREHENSIVE METABOLIC PANEL WITH GFR - Abnormal; Notable for the following components:      Result Value   Glucose, Bld 135 (*)    Calcium  11.5 (*)    All other components within normal limits  URINALYSIS, ROUTINE  W REFLEX MICROSCOPIC - Abnormal; Notable for the following components:   Ketones, ur 20 (*)    Protein, ur 30 (*)    All other components within normal limits  CBC WITH DIFFERENTIAL/PLATELET - Abnormal; Notable for the following components:   Neutro Abs 8.6 (*)    All other components within normal limits  LACTIC ACID, PLASMA - Abnormal; Notable for the following components:   Lactic Acid, Venous 2.0 (*)    All other components within normal limits  LIPASE, BLOOD  LACTIC ACID, PLASMA  CBC  BASIC METABOLIC PANEL WITH GFR    EKG: None  Radiology: DG Chest Port 1 View Result Date: 01/11/2024 CLINICAL DATA:  Nasogastric tube placement EXAM: PORTABLE CHEST 1 VIEW COMPARISON:  12/23/2022 FINDINGS: Nasogastric tube tip is in the stomach body and side port is in the stomach body near the cardia. Low lung volumes. Atherosclerotic calcification of the aortic arch. Scattered gas in the large bowel in the upper abdomen. IMPRESSION: 1. Nasogastric tube tip is in the stomach body and side port is in the stomach body near the cardia. Electronically Signed   By: Ryan Salvage M.D.   On: 01/11/2024 17:24   CT ABDOMEN PELVIS WO CONTRAST Result Date: 01/11/2024 CLINICAL DATA:  Abdominal pain for 2 weeks, emesis.  Constipation. EXAM: CT ABDOMEN AND PELVIS WITHOUT CONTRAST TECHNIQUE: Multidetector CT imaging of the abdomen and pelvis was performed following the standard protocol without IV contrast. RADIATION DOSE REDUCTION: This exam was performed according to the departmental dose-optimization program which includes automated exposure control, adjustment of the mA and/or kV according to patient size and/or use of iterative reconstruction technique. COMPARISON:  12/22/2022. FINDINGS: Lower chest: Minimal dependent atelectasis in the lung bases. Heart size normal. No pericardial effusion. No pleural effusion. Distal esophagus is grossly unremarkable. Hepatobiliary: Liver and gallbladder are unremarkable. No  biliary ductal dilatation. Pancreas: Negative. Spleen: Negative. Adrenals/Urinary Tract: Adrenal glands and kidneys are unremarkable. Ureters are decompressed. Bladder is low in volume. Stomach/Bowel: Stomach and proximal small bowel are unremarkable. Dilated and fluid-filled distal small bowel and ascending/transverse colon to the level a periumbilical hernia, which contains a short segment of transverse colon. The wall is hyperattenuating and there is surrounding edema within the hernia. Remainder of the colon is decompressed. Appendix is not readily visualized. Vascular/Lymphatic: Vascular structures are unremarkable. No pathologically enlarged lymph nodes. Reproductive: Hysterectomy.  No adnexal mass. Other: No free fluid. Mesenteries and peritoneum are otherwise unremarkable. Musculoskeletal: Degenerative changes in the spine. Advanced left hip osteoarthritis. Grade 1 anterolisthesis of L5 on S1. IMPRESSION: Distal small bowel and proximal colonic obstruction secondary to a periumbilical hernia containing a short segment of transverse colon.  Transverse colon within the hernia is hyperattenuating and has surrounding edema. Impending ischemia cannot be excluded. Electronically Signed   By: Newell Eke M.D.   On: 01/11/2024 13:26     .Critical Care  Performed by: Towana Ozell BROCKS, MD Authorized by: Towana Ozell BROCKS, MD   Critical care provider statement:    Critical care time (minutes):  45   Critical care time was exclusive of:  Separately billable procedures and treating other patients   Critical care was necessary to treat or prevent imminent or life-threatening deterioration of the following conditions:  Sepsis   Critical care was time spent personally by me on the following activities:  Development of treatment plan with patient or surrogate, discussions with consultants, evaluation of patient's response to treatment, examination of patient, obtaining history from patient or surrogate,  ordering and performing treatments and interventions, ordering and review of laboratory studies, ordering and review of radiographic studies, pulse oximetry, re-evaluation of patient's condition and review of old charts   I assumed direction of critical care for this patient from another provider in my specialty: no   Hernia reduction  Date/Time: 01/11/2024 5:31 PM  Performed by: Towana Ozell BROCKS, MD Authorized by: Towana Ozell BROCKS, MD  Consent: Verbal consent obtained Consent given by: patient Patient understanding: patient states understanding of the procedure being performed Patient identity confirmed: verbally with patient Local anesthesia used: no  Anesthesia: Local anesthesia used: no  Sedation: Patient sedated: no  Patient tolerance: patient tolerated the procedure well with no immediate complications Comments: Unsuccessful attempt x 2 to manually reduce ventral hernia.      Medications Ordered in the ED  0.9 %  sodium chloride  infusion ( Intravenous MAR Unhold 01/11/24 1728)  chlorhexidine  (PERIDEX ) 0.12 % solution (has no administration in time range)  ceFAZolin  (ANCEF ) 2-4 GM/100ML-% IVPB (has no administration in time range)  losartan-hydrochlorothiazide  (HYZAAR) 50-12.5 MG per tablet 1 tablet (has no administration in time range)  rosuvastatin (CRESTOR) tablet 10 mg (has no administration in time range)  lactated ringers  infusion (has no administration in time range)  morphine (PF) 2 MG/ML injection 2-4 mg (has no administration in time range)  methocarbamol (ROBAXIN) tablet 500 mg (has no administration in time range)  diphenhydrAMINE  (BENADRYL ) 12.5 MG/5ML elixir 12.5 mg (has no administration in time range)    Or  diphenhydrAMINE  (BENADRYL ) injection 12.5 mg (has no administration in time range)  ondansetron  (ZOFRAN -ODT) disintegrating tablet 4 mg (has no administration in time range)    Or  ondansetron  (ZOFRAN ) injection 4 mg (has no administration in time  range)  simethicone (MYLICON) chewable tablet 40 mg (has no administration in time range)  pantoprazole (PROTONIX) injection 40 mg (has no administration in time range)  metoprolol tartrate (LOPRESSOR) injection 5 mg (has no administration in time range)  sodium chloride  0.9 % bolus 1,000 mL (0 mLs Intravenous Stopped 01/11/24 1411)  ondansetron  (ZOFRAN ) injection 4 mg (4 mg Intravenous Given 01/11/24 1212)  cefoTEtan (CEFOTAN) 2 g in sodium chloride  0.9 % 100 mL IVPB (2 g Intravenous New Bag/Given 01/11/24 1512)    Clinical Course as of 01/11/24 1730  Mon Jan 11, 2024  1344 I am unable to completely reduce the hernia.  I reached out to Dr. Kallie general surgery.  She will take a look at her scan and get back to me with her recommendations.  Patient does take Plavix.  She said she has not taken it today and usually takes it around 7 PM at night. [  MB]  1404 Plan from Dr. Kallie says that she is going to take her to the operating room.  She asked for an NG to be placed. [MB]  1413 I updated patient and family and she is agreeable. [MB]    Clinical Course User Index [MB] Towana Ozell BROCKS, MD                                 Medical Decision Making Amount and/or Complexity of Data Reviewed Labs: ordered. Radiology: ordered.  Risk Prescription drug management. Decision regarding hospitalization.   This patient complains of mid abdominal pain nausea vomiting; this involves an extensive number of treatment Options and is a complaint that carries with it a high risk of complications and morbidity. The differential includes incarcerated hernia, obstruction, gastritis, peptic ulcer disease  I ordered, reviewed and interpreted labs, which included CBC normal chemistries normal calcium  mildly elevated urinalysis without clear signs of infection lactate mildly elevated cleared with fluids I ordered medication IV fluids and nausea medication and reviewed PMP when indicated. I ordered imaging  studies which included CT abdomen and pelvis and I independently    visualized and interpreted imaging which showed umbilical hernia with bowel obstruction Additional history obtained from patient's family members Previous records obtained and reviewed in epic including outpatient surgery note I consulted Dr. Kallie general surgery and discussed lab and imaging findings and discussed disposition.  Cardiac monitoring reviewed, sinus rhythm Social determinants considered, stress Critical Interventions: Attempted bedside reduction of hernia and consultation of general surgery for possible ischemic bowel  After the interventions stated above, I reevaluated the patient and found to be fairly comfortable from pain standpoint Admission and further testing considered, she will be going to the operating room under the care of Dr. Kallie general surgery for hernia repair/reduction      Final diagnoses:  Incarcerated umbilical hernia    ED Discharge Orders     None          Towana Ozell BROCKS, MD 01/11/24 1733

## 2024-01-11 NOTE — Progress Notes (Signed)
 Pt arrived to room 339 via stretcher from PACU. Pt A&O x4. DDI to midline abd incision with small amount of bloody drainage noted and marked for reference. NGT intact to right nares, 52cm at nare. Attached to LIS, small amount of light green gastric drainage noted in tube. IV site WNL, fluids infusing without difficulty. Advised pt and family of NPO x ice chips dietary orders and rationale. Pt and family members oriented to room and safety procedures, all state understanding. Bed alarm on for safety, call bell within reach. Advised to call for needs and not to get OOB, pt states understanding.

## 2024-01-11 NOTE — Progress Notes (Signed)
 [facility]  Patient's family updated. NG in place and confirmed on CXR. Binder in place. Ice. PRN Morphine for pain. Will see how she does with bowel function given the incarceration. Labs in AM. Home meds tomorrow that have to be ordered ,holding plavix right now.  Manuelita Pander, MD [facility] [address] [place], [place] [address] [phone] (office)

## 2024-01-11 NOTE — Anesthesia Preprocedure Evaluation (Signed)
 Anesthesia Evaluation  Patient identified by MRN, date of birth, ID band Patient awake    Reviewed: Allergy & Precautions, H&P , NPO status , Patient's Chart, lab work & pertinent test results, reviewed documented beta blocker date and time   Airway Mallampati: II  TM Distance: >3 FB Neck ROM: full    Dental no notable dental hx.    Pulmonary neg pulmonary ROS, former smoker   Pulmonary exam normal breath sounds clear to auscultation       Cardiovascular Exercise Tolerance: Good hypertension, negative cardio ROS  Rhythm:regular Rate:Normal     Neuro/Psych negative neurological ROS  negative psych ROS   GI/Hepatic negative GI ROS, Neg liver ROS,,,  Endo/Other  negative endocrine ROS    Renal/GU negative Renal ROS  negative genitourinary   Musculoskeletal   Abdominal   Peds  Hematology negative hematology ROS (+)   Anesthesia Other Findings   Reproductive/Obstetrics negative OB ROS                              Anesthesia Physical Anesthesia Plan  ASA: 3 and emergent  Anesthesia Plan: General and General ETT   Post-op Pain Management:    Induction:   PONV Risk Score and Plan: Ondansetron   Airway Management Planned:   Additional Equipment:   Intra-op Plan:   Post-operative Plan:   Informed Consent: I have reviewed the patients History and Physical, chart, labs and discussed the procedure including the risks, benefits and alternatives for the proposed anesthesia with the patient or authorized representative who has indicated his/her understanding and acceptance.     Dental Advisory Given  Plan Discussed with: CRNA  Anesthesia Plan Comments:          Anesthesia Quick Evaluation

## 2024-01-11 NOTE — H&P (Signed)
 [facility] History and Physical  Reason for Referral: Incarcerated hernia with colon  Referring Physician:  Dr. Towana, MD   Chief Complaint   Abdominal Pain     Chelsey Johnson is a 73 y.o. female.  HPI:   Chelsey Johnson is a 73 yo who has an incarcerated umbilical hernia with transverse colon in the hernia and concern for possible ischemic changes on the CT can with edema. She has been having abdominal pain, some issues with prior constipation, although she is having diarrhea now. She denies any history of cardiac issues but there is some concern she had a issues with hand numbness and her PCP put her on the plavix after she had an US  with carotid atherosclerosis but no significant stenosis.   Past Medical History:  Diagnosis Date   Allergy    Anesthesia complication    tachycardia   Breast cancer (HCC) 2020   dcis   Complication of anesthesia    blood pressure drops quickly with anesthetic   HTN (hypertension)    Obesity    Personal history of radiation therapy 2020    Past Surgical History:  Procedure Laterality Date   ABDOMINAL HYSTERECTOMY  1986   complete, uterine fibroids   BREAST BIOPSY Right 2020   Intermediate grade ductal carcinoma in situ partially involving an  intraductal papilloma   BREAST BIOPSY Left 03/16/2023   MM LT BREAST BX W LOC DEV EA AD LESION IMG BX SPEC STEREO GUIDE 03/16/2023 [facility]   BREAST BIOPSY Left 03/16/2023   MM LT BREAST BX W LOC DEV 1ST LESION IMAGE BX SPEC STEREO GUIDE 03/16/2023 [facility]   COLONOSCOPY  09/19/2011   Procedure: COLONOSCOPY;  Surgeon: Margo LITTIE Haddock, MD;  [facility];  Service: Endoscopy;  Laterality: N/A;  12:30   COLONOSCOPY WITH PROPOFOL  N/A 10/31/2021   Procedure: COLONOSCOPY WITH PROPOFOL ;  Surgeon: Cindie Carlin POUR, DO;  [facility];  Service: Endoscopy;  Laterality: N/A;  950   PARTIAL MASTECTOMY WITH NEEDLE LOCALIZATION Right 03/09/2019   Procedure:  PARTIAL MASTECTOMY WITH NEEDLE LOCALIZATION;  Surgeon: Mavis Anes, MD;  Location: [facility];  Service: General;  Laterality: Right;   POLYPECTOMY  10/31/2021   Procedure: POLYPECTOMY;  Surgeon: Cindie Carlin POUR, DO;  [facility];  Service: Endoscopy;;    Family History  Problem Relation Age of Onset   Ulcers Brother    Colon polyps Mother 43   Congestive Heart Failure Mother    Lymphoma Father    Hypertension Sister    Breast cancer Sister    Hypertension Sister    Breast cancer Sister    Hypertension Sister    Hypertension Sister     Social History   Tobacco Use   Smoking status: Former    Current packs/day: 0.00    Average packs/day: 1 pack/day for 10.0 years (10.0 ttl pk-yrs)    Types: Cigarettes    Start date: 09/10/1981    Quit date: 09/11/1991    Years since quitting: 32.3    Passive exposure: Past   Smokeless tobacco: Never   Tobacco comments:    quit about 20 + years  Vaping Use   Vaping status: Never Used  Substance Use Topics   Alcohol use: No   Drug use: No    Medications: I have reviewed the patient's current medications. Medications Prior to Admission  Medication Sig Dispense Refill Last Dose/Taking   anastrozole  (ARIMIDEX ) 1 MG tablet Take 1 tablet  by mouth once daily 90 tablet 0    Calcium  500 MG tablet Take 1 tablet (500 mg total) by mouth daily. 90 tablet 3    Cholecalciferol (VITAMIN D3 PO) Take 1 tablet by mouth daily.      clopidogrel (PLAVIX) 75 MG tablet Take 75 mg by mouth daily.      furosemide (LASIX) 20 MG tablet Take 20 mg by mouth daily as needed for fluid or edema.      losartan-hydrochlorothiazide  (HYZAAR) 50-12.5 MG tablet Take 1 tablet by mouth daily.      Menthol, Topical Analgesic, (ICY HOT EX) Apply topically.      rosuvastatin (CRESTOR) 10 MG tablet Take 10 mg by mouth daily.       Allergies  Allergen Reactions   Anesthetics, Amide Palpitations    Pt reports Tachycardia w/ HRs 170's with General Anesthesia but  cannot give details regarding names of medications.   Tylox Emilia.Ensign ] Palpitations    Tachycardia    Iodinated Contrast Media Itching    Pt began itching after Omnipaque  injection.      ROS:  A comprehensive review of systems was negative except for: Gastrointestinal: positive for abdominal pain, nausea, vomiting, and diarrhea  Blood pressure (!) 146/101, pulse (!) 124, temperature 97.8 F (36.6 C), resp. rate 19, height 5' 2 (1.575 m), weight 86.2 kg, SpO2 100%. Physical Exam Vitals reviewed.  HENT:     Head: Normocephalic.  Cardiovascular:     Rate and Rhythm: Normal rate.  Pulmonary:     Effort: Pulmonary effort is normal.  Abdominal:     General: There is distension.     Palpations: Abdomen is soft.     Hernia: A hernia is present. Hernia is present in the umbilical area.     Comments: Nonreducible, tender  Musculoskeletal:     Comments: Moves all extremities   Skin:    General: Skin is warm.  Neurological:     General: No focal deficit present.     Mental Status: She is alert and oriented to person, place, and time.  Psychiatric:        Mood and Affect: Mood normal.     Results: Results for orders placed or performed during the hospital encounter of 01/11/24 (from the past 48 hours)  Urinalysis, Routine w reflex microscopic -Urine, Clean Catch     Status: Abnormal   Collection Time: 01/11/24 11:19 AM  Result Value Ref Range   Color, Urine YELLOW YELLOW   APPearance CLEAR CLEAR   Specific Gravity, Urine 1.027 1.005 - 1.030   pH 6.0 5.0 - 8.0   Glucose, UA NEGATIVE NEGATIVE mg/dL   Hgb urine dipstick NEGATIVE NEGATIVE   Bilirubin Urine NEGATIVE NEGATIVE   Ketones, ur 20 (A) NEGATIVE mg/dL   Protein, ur 30 (A) NEGATIVE mg/dL   Nitrite NEGATIVE NEGATIVE   Leukocytes,Ua NEGATIVE NEGATIVE   RBC / HPF 0-5 0 - 5 RBC/hpf   WBC, UA 0-5 0 - 5 WBC/hpf   Bacteria, UA NONE SEEN NONE SEEN   Squamous Epithelial / HPF 0-5 0 - 5 /HPF   Mucus PRESENT     Hyaline Casts, UA PRESENT    Granular Casts, UA PRESENT     Comment: Performed at [facility], [address] [place], [place] [address]  Lipase, blood     Status: None   Collection Time: 01/11/24 11:33 AM  Result Value Ref Range   Lipase 16 11 - 51 U/L    Comment: Performed at  [facility], [address] [place], [place] [address]  Comprehensive metabolic panel     Status: Abnormal   Collection Time: 01/11/24 11:33 AM  Result Value Ref Range   Sodium 136 135 - 145 mmol/L   Potassium 4.6 3.5 - 5.1 mmol/L   Chloride 98 98 - 111 mmol/L   CO2 27 22 - 32 mmol/L   Glucose, Bld 135 (H) 70 - 99 mg/dL    Comment: Glucose reference range applies only to samples taken after fasting for at least 8 hours.   BUN 15 8 - 23 mg/dL   Creatinine, Ser 9.21 0.44 - 1.00 mg/dL   Calcium  11.5 (H) 8.9 - 10.3 mg/dL   Total Protein 7.7 6.5 - 8.1 g/dL   Albumin 4.5 3.5 - 5.0 g/dL   AST 28 15 - 41 U/L   ALT 16 0 - 44 U/L   Alkaline Phosphatase 73 38 - 126 U/L   Total Bilirubin 0.9 0.0 - 1.2 mg/dL   GFR, Estimated >39 >39 mL/min    Comment: (NOTE) Calculated using the CKD-EPI Creatinine Equation (2021)    Anion gap 11 5 - 15    Comment: Performed at [facility], [address] [place], [place] [address]  CBC with Differential     Status: Abnormal   Collection Time: 01/11/24 11:33 AM  Result Value Ref Range   WBC 10.2 4.0 - 10.5 K/uL   RBC 5.05 3.87 - 5.11 MIL/uL   Hemoglobin 14.4 12.0 - 15.0 g/dL   HCT 56.3 63.9 - 53.9 %   MCV 86.3 80.0 - 100.0 fL   MCH 28.5 26.0 - 34.0 pg   MCHC 33.0 30.0 - 36.0 g/dL   RDW 86.9 88.4 - 84.4 %   Platelets 181 150 - 400 K/uL   nRBC 0.0 0.0 - 0.2 %   Neutrophils Relative % 85 %   Neutro Abs 8.6 (H) 1.7 - 7.7 K/uL   Lymphocytes Relative 8 %   Lymphs Abs 0.8 0.7 - 4.0 K/uL   Monocytes Relative 7 %   Monocytes Absolute 0.8 0.1 - 1.0 K/uL   Eosinophils Relative 0 %   Eosinophils Absolute 0.0 0.0 - 0.5 K/uL   Basophils Relative 0 %   Basophils Absolute 0.0 0.0 -  0.1 K/uL   Immature Granulocytes 0 %   Abs Immature Granulocytes 0.03 0.00 - 0.07 K/uL    Comment: Performed at [facility], [address], [place], [place] [address]  Lactic acid, plasma     Status: Abnormal   Collection Time: 01/11/24 12:07 PM  Result Value Ref Range   Lactic Acid, Venous 2.0 (HH) 0.5 - 1.9 mmol/L    Comment: Critical Value, Read Back and verified with S HLADILEK AT 1324 ON [id number] BY S DALTON Performed at [facility], [address], [place], [place] [address]     CT ABDOMEN PELVIS WO CONTRAST Result Date: 01/11/2024 CLINICAL DATA:  Abdominal pain for 2 weeks, emesis.  Constipation. EXAM: CT ABDOMEN AND PELVIS WITHOUT CONTRAST TECHNIQUE: Multidetector CT imaging of the abdomen and pelvis was performed following the standard protocol without IV contrast. RADIATION DOSE REDUCTION: This exam was performed according to the departmental dose-optimization program which includes automated exposure control, adjustment of the mA and/or kV according to patient size and/or use of iterative reconstruction technique. COMPARISON:  12/22/2022. FINDINGS: Lower chest: Minimal dependent atelectasis in the lung bases. Heart size normal. No pericardial effusion. No pleural effusion. Distal esophagus is grossly unremarkable. Hepatobiliary: Liver and gallbladder  are unremarkable. No biliary ductal dilatation. Pancreas: Negative. Spleen: Negative. Adrenals/Urinary Tract: Adrenal glands and kidneys are unremarkable. Ureters are decompressed. Bladder is low in volume. Stomach/Bowel: Stomach and proximal small bowel are unremarkable. Dilated and fluid-filled distal small bowel and ascending/transverse colon to the level a periumbilical hernia, which contains a short segment of transverse colon. The wall is hyperattenuating and there is surrounding edema within the hernia. Remainder of the colon is decompressed. Appendix is not readily visualized. Vascular/Lymphatic: Vascular structures are unremarkable.  No pathologically enlarged lymph nodes. Reproductive: Hysterectomy.  No adnexal mass. Other: No free fluid. Mesenteries and peritoneum are otherwise unremarkable. Musculoskeletal: Degenerative changes in the spine. Advanced left hip osteoarthritis. Grade 1 anterolisthesis of L5 on S1. IMPRESSION: Distal small bowel and proximal colonic obstruction secondary to a periumbilical hernia containing a short segment of transverse colon. Transverse colon within the hernia is hyperattenuating and has surrounding edema. Impending ischemia cannot be excluded. Electronically Signed   By: Newell Eke M.D.   On: 01/11/2024 13:26     Assessment & Plan  Kaylor Maiers Banfield is a 73 y.o. female with incarcerated hernia with transverse colon. Given the colon for ischemia we need to proceed with surgery and repair. Discussed exploratory laparotomy, possible bowel resection, possible primary repair of the hernia or repair with mesh. Discussed risk of recurrence, risk of bleeding due to the plavix, concern for bowel ischemia.   NPO, NG  OR for repair Here likely a few days post op Discussed with her and her family.   All questions were answered to the satisfaction of the patient and family.    Manuelita JAYSON Pander 01/11/2024, 2:49 PM

## 2024-01-11 NOTE — Transfer of Care (Signed)
 Immediate Anesthesia Transfer of Care Note  Patient: Chelsey Johnson  Procedure(s) Performed: LAPAROTOMY, EXPLORATORY; Hernia Repair with Mesh, Reduction of Colon (Abdomen)  Patient Location: PACU  Anesthesia Type:General  Level of Consciousness: awake, alert , oriented, and patient cooperative  Airway & Oxygen Therapy: Patient Spontanous Breathing  Post-op Assessment: Report given to RN, Post -op Vital signs reviewed and stable, and Patient moving all extremities X 4  Post vital signs: Reviewed and stable  Last Vitals:  Vitals Value Taken Time  BP 172/78 01/11/24 16:51  Temp 37.2 C 01/11/24 16:50  Pulse 88 01/11/24 16:57  Resp 17 01/11/24 16:57  SpO2 100 % 01/11/24 16:57  Vitals shown include unfiled device data.  Last Pain:  Vitals:   01/11/24 1456  TempSrc: Oral  PainSc: 0-No pain         Complications: No notable events documented.

## 2024-01-11 NOTE — Anesthesia Procedure Notes (Signed)
 Procedure Name: Intubation Date/Time: 01/11/2024 3:33 PM  Performed by: Cordella Elvie HERO, CRNAPre-anesthesia Checklist: Patient identified, Emergency Drugs available, Suction available, Patient being monitored and Timeout performed Patient Re-evaluated:Patient Re-evaluated prior to induction Oxygen Delivery Method: Circle system utilized Preoxygenation: Pre-oxygenation with 100% oxygen Induction Type: IV induction, Cricoid Pressure applied and Rapid sequence Laryngoscope Size: Mac and 3 Grade View: Grade I Tube type: Oral Tube size: 7.0 mm Number of attempts: 1 Airway Equipment and Method: Stylet Placement Confirmation: ETT inserted through vocal cords under direct vision, positive ETCO2, CO2 detector and breath sounds checked- equal and bilateral Secured at: 21 cm Tube secured with: Tape Dental Injury: Teeth and Oropharynx as per pre-operative assessment

## 2024-01-12 ENCOUNTER — Encounter (HOSPITAL_COMMUNITY): Payer: Self-pay | Admitting: General Surgery

## 2024-01-12 ENCOUNTER — Inpatient Hospital Stay (HOSPITAL_COMMUNITY)

## 2024-01-12 LAB — CBC
HCT: 36.6 % (ref 36.0–46.0)
Hemoglobin: 12.1 g/dL (ref 12.0–15.0)
MCH: 28.8 pg (ref 26.0–34.0)
MCHC: 33.1 g/dL (ref 30.0–36.0)
MCV: 87.1 fL (ref 80.0–100.0)
Platelets: 149 K/uL — ABNORMAL LOW (ref 150–400)
RBC: 4.2 MIL/uL (ref 3.87–5.11)
RDW: 13.2 % (ref 11.5–15.5)
WBC: 8 K/uL (ref 4.0–10.5)
nRBC: 0 % (ref 0.0–0.2)

## 2024-01-12 LAB — BASIC METABOLIC PANEL WITH GFR
Anion gap: 13 (ref 5–15)
BUN: 11 mg/dL (ref 8–23)
CO2: 26 mmol/L (ref 22–32)
Calcium: 9.6 mg/dL (ref 8.9–10.3)
Chloride: 104 mmol/L (ref 98–111)
Creatinine, Ser: 0.72 mg/dL (ref 0.44–1.00)
GFR, Estimated: >60 mL/min (ref >60)
Glucose, Bld: 92 mg/dL (ref 70–99)
Potassium: 4 mmol/L (ref 3.5–5.1)
Sodium: 143 mmol/L (ref 135–145)

## 2024-01-12 MED ORDER — ANASTROZOLE 1 MG PO TABS
1.0000 mg | ORAL_TABLET | Freq: Every day | ORAL | Status: DC
  Administered 2024-01-12 – 2024-01-14 (×3): 1 mg via ORAL
  Filled 2024-01-12 (×4): qty 1

## 2024-01-12 MED ORDER — DOCUSATE SODIUM 100 MG PO CAPS
100.0000 mg | ORAL_CAPSULE | Freq: Two times a day (BID) | ORAL | Status: DC
  Administered 2024-01-12 (×2): 100 mg via ORAL
  Filled 2024-01-12 (×4): qty 1

## 2024-01-12 MED ORDER — HYDROCODONE-ACETAMINOPHEN 5-325 MG PO TABS
1.0000 | ORAL_TABLET | ORAL | Status: DC | PRN
  Administered 2024-01-12 – 2024-01-14 (×2): 1 via ORAL
  Filled 2024-01-12 (×2): qty 1

## 2024-01-12 MED ORDER — MORPHINE SULFATE (PF) 2 MG/ML IV SOLN: 2.0000 mg | INTRAVENOUS | Status: DC | PRN

## 2024-01-12 NOTE — Progress Notes (Signed)
   01/12/24 0223  Assess: MEWS Score  Temp 98.9 F (37.2 C)  BP 136/67  MAP (mmHg) 87  Pulse Rate (!) 125  Resp 20  SpO2 93 %  O2 Device Room Air  Assess: MEWS Score  MEWS Temp 0  MEWS Systolic 0  MEWS Pulse 2  MEWS RR 0  MEWS LOC 0  MEWS Score 2  MEWS Score Color Yellow  Provider Notification  Provider Name/Title Evalene Sprinkles, MD  Date Provider Notified 01/12/24  Time Provider Notified [phone]  Method of Notification Page (secure chat)  Notification Reason Other (Comment) (yellow mews d/t HR 125)  Provider response No new orders  Assess: SIRS CRITERIA  SIRS Temperature  0  SIRS Respirations  0  SIRS Pulse 1  SIRS WBC 0  SIRS Score Sum  1

## 2024-01-12 NOTE — Progress Notes (Addendum)
   01/12/24 1531  Vitals  Temp 98.4 F (36.9 C)  Temp Source Oral  BP (!) 97/59  MAP (mmHg) 71  BP Location Left Arm  BP Method Automatic  Patient Position (if appropriate) Lying  Pulse Rate Source Dinamap  MEWS COLOR  MEWS Score Color Yellow  Oxygen Therapy  SpO2 92 %  O2 Device Room Air  MEWS Score  MEWS Temp 0  MEWS Systolic 1  MEWS Pulse 1  MEWS RR 0  MEWS LOC 0  MEWS Score 2  Provider Notification  Provider Name/Title Manuelita Pander MD  Date Provider Notified 01/12/24  Time Provider Notified 1531  Method of Notification Page  Notification Reason Other (Comment) (Low BP high HR)  Provider response No new orders  Date of Provider Response 01/12/24  Time of Provider Response [phone]

## 2024-01-12 NOTE — Plan of Care (Signed)

## 2024-01-12 NOTE — TOC CM/SW Note (Signed)
 Transition of Care [facility]) - Inpatient Brief Assessment   Patient Details  Name: Chelsey Johnson MRN: [id number] Date of Birth: [birth year]  Transition of Care [facility]) CM/SW Contact:    Noreen KATHEE Pinal, LCSWA Phone Number: 01/12/2024, 9:09 AM   Clinical Narrative:  Inpatient Care Management (ICM) has reviewed patient and no other ICM needs have been identified at this time. We will continue to monitor patient advancement through interdisciplinary progression rounds. If new patient transition needs arise, please place a ICM consult.  Transition of Care Asessment: Insurance and Status: Insurance coverage has been reviewed Patient has primary care physician: Yes Home environment has been reviewed: Single Family Home Prior level of function:: Independent Prior/Current Home Services: No current home services Social Drivers of Health Review: SDOH reviewed no interventions necessary Readmission risk has been reviewed: Yes Transition of care needs: no transition of care needs at this time

## 2024-01-12 NOTE — Plan of Care (Signed)
  Problem: Education: Goal: Knowledge of General Education information will improve Description: Including pain rating scale, medication(s)/side effects and non-pharmacologic comfort measures Outcome: Progressing   Problem: Health Behavior/Discharge Planning: Goal: Ability to manage health-related needs will improve Outcome: Progressing   Problem: Clinical Measurements: Goal: Ability to maintain clinical measurements within normal limits will improve Outcome: Progressing Goal: Will remain free from infection Outcome: Progressing Goal: Diagnostic test results will improve Outcome: Progressing Goal: Respiratory complications will improve Outcome: Progressing Goal: Cardiovascular complication will be avoided Outcome: Progressing   Problem: Activity: Goal: Risk for activity intolerance will decrease Outcome: Progressing   Problem: Elimination: Goal: Will not experience complications related to bowel motility Outcome: Progressing Goal: Will not experience complications related to urinary retention Outcome: Progressing   Problem: Pain Managment: Goal: General experience of comfort will improve and/or be controlled Outcome: Progressing

## 2024-01-12 NOTE — Progress Notes (Signed)
 Mobility Specialist Progress Note:    01/12/24 1140  Mobility  Activity Ambulated with assistance  Level of Assistance Minimal assist, patient does 75% or more  Assistive Device Front wheel walker  Distance Ambulated (ft) 50 ft  Range of Motion/Exercises Active;All extremities  Activity Response Tolerated well  Mobility Referral Yes  Mobility visit 1 Mobility  Mobility Specialist Start Time (ACUTE ONLY) 1140  Mobility Specialist Stop Time (ACUTE ONLY) 1200  Mobility Specialist Time Calculation (min) (ACUTE ONLY) 20 min   Pt received in bed, agreeable to mobility. Required MinA to stand and SBA to ambulate with RW. Tolerated well, c/o abdominal pain. Returned supine, family in room. All needs met.  Chelsey Johnson Mobility Specialist Please contact via Special educational needs teacher or  Rehab office at [phone]

## 2024-01-12 NOTE — Progress Notes (Addendum)
 [facility]  Doing fair this AM. Having some pain. KUB with gas in the distal colon. NG with minimal output.  BP (!) 145/72 (BP Location: Left Arm)   Pulse (!) 123   Temp 99.3 F (37.4 C) (Oral)   Resp 20   Ht 5' 2 (1.575 m)   Wt 86.2 kg   SpO2 94%   BMI 34.75 kg/m  Soft, nondistended, appropriately tender, honeycomb with some bleeding but dry, binder in place  Patient s/p ventral hernia repair with mesh and reduction of colon for incarcerated hernia. Doing well.  PRN for pain, added oral Norco IS, OOB Tachycardia, likely from pain and maybe from some low grade fever from atelectasis, monitor NG out Clear diet Bowel regimen  Labs reassuring, H&H down, but no signs of bleeding Labs tomorrow SCDs, holding plavix another day given the drifting H&H  Ambulate Will see if has BM  Manuelita Pander, MD [facility] [address] [place], [place] [address] [phone] (office)

## 2024-01-13 LAB — BASIC METABOLIC PANEL WITH GFR
Anion gap: 7 (ref 5–15)
BUN: 7 mg/dL — ABNORMAL LOW (ref 8–23)
CO2: 27 mmol/L (ref 22–32)
Calcium: 9.6 mg/dL (ref 8.9–10.3)
Chloride: 101 mmol/L (ref 98–111)
Creatinine, Ser: 0.58 mg/dL (ref 0.44–1.00)
GFR, Estimated: >60 mL/min (ref >60)
Glucose, Bld: 99 mg/dL (ref 70–99)
Potassium: 3.9 mmol/L (ref 3.5–5.1)
Sodium: 135 mmol/L (ref 135–145)

## 2024-01-13 LAB — CBC WITH DIFFERENTIAL/PLATELET
Abs Immature Granulocytes: 0.03 K/uL (ref 0.00–0.07)
Basophils Absolute: 0 K/uL (ref 0.0–0.1)
Basophils Relative: 0 %
Eosinophils Absolute: 0.2 K/uL (ref 0.0–0.5)
Eosinophils Relative: 2 %
HCT: 34.5 % — ABNORMAL LOW (ref 36.0–46.0)
Hemoglobin: 11.3 g/dL — ABNORMAL LOW (ref 12.0–15.0)
Immature Granulocytes: 0 %
Lymphocytes Relative: 11 %
Lymphs Abs: 1 K/uL (ref 0.7–4.0)
MCH: 28.8 pg (ref 26.0–34.0)
MCHC: 32.8 g/dL (ref 30.0–36.0)
MCV: 87.8 fL (ref 80.0–100.0)
Monocytes Absolute: 1.1 K/uL — ABNORMAL HIGH (ref 0.1–1.0)
Monocytes Relative: 13 %
Neutro Abs: 6.6 K/uL (ref 1.7–7.7)
Neutrophils Relative %: 74 %
Platelets: 128 K/uL — ABNORMAL LOW (ref 150–400)
RBC: 3.93 MIL/uL (ref 3.87–5.11)
RDW: 12.9 % (ref 11.5–15.5)
WBC: 8.9 K/uL (ref 4.0–10.5)
nRBC: 0 % (ref 0.0–0.2)

## 2024-01-13 NOTE — Care Management Important Message (Signed)
 Important Message  Patient Details  Name: Chelsey Johnson MRN: [id number] Date of Birth: 1951-02-14   Important Message Given:  N/A - LOS <3 / Initial given by admissions     Duwaine LITTIE Ada 01/13/2024, 10:09 AM

## 2024-01-13 NOTE — Plan of Care (Signed)

## 2024-01-13 NOTE — Progress Notes (Signed)
 [facility]  No nausea. Doing ok with clears. No nausea. Having flatus but no bm.   BP 105/80 (BP Location: Left Arm)   Pulse 98   Temp 98.6 F (37 C) (Oral)   Resp 17   Ht 5' 2 (1.575 m)   Wt 86.2 kg   SpO2 93%   BMI 34.75 kg/m  Soft nondistended appropriately tender Hernia site c/d/I with no signs of infection, dry blood on honeycomb, binder in place  Patient s/p incarcerated hernia repair with mesh. Doing well.   PRN for pain Soft diet Colace Will see if has Bm May need miralax Hopefully home tomorrow H&H drifted some, no signs of bleeding, will monitor SCDs Ambulate   Manuelita Pander, MD [facility] [address] [place], [place] [address] [phone] (office)

## 2024-01-13 NOTE — Progress Notes (Signed)
 Mobility Specialist Progress Note:    01/13/24 1258  Mobility  Activity Ambulated with assistance  Level of Assistance Modified independent, requires aide device or extra time  Assistive Device Front wheel walker  Distance Ambulated (ft) 100 ft  Range of Motion/Exercises Active;All extremities  Activity Response Tolerated well  Mobility Referral Yes  Mobility visit 1 Mobility  Mobility Specialist Start Time (ACUTE ONLY) 1258  Mobility Specialist Stop Time (ACUTE ONLY) 1318  Mobility Specialist Time Calculation (min) (ACUTE ONLY) 20 min   Pt received sitting EOB, agreeable to mobility. ModI to stand and ambulate with RW. Tolerated well, returned sitting EOB, all needs met.  Chelsey Johnson Mobility Specialist Please contact via Special educational needs teacher or  Rehab office at [phone]

## 2024-01-14 LAB — CBC WITH DIFFERENTIAL/PLATELET
Abs Immature Granulocytes: 0.03 K/uL (ref 0.00–0.07)
Basophils Absolute: 0 K/uL (ref 0.0–0.1)
Basophils Relative: 0 %
Eosinophils Absolute: 0.3 K/uL (ref 0.0–0.5)
Eosinophils Relative: 4 %
HCT: 35.1 % — ABNORMAL LOW (ref 36.0–46.0)
Hemoglobin: 11.5 g/dL — ABNORMAL LOW (ref 12.0–15.0)
Immature Granulocytes: 0 %
Lymphocytes Relative: 11 %
Lymphs Abs: 0.8 K/uL (ref 0.7–4.0)
MCH: 28.5 pg (ref 26.0–34.0)
MCHC: 32.8 g/dL (ref 30.0–36.0)
MCV: 87.1 fL (ref 80.0–100.0)
Monocytes Absolute: 0.9 K/uL (ref 0.1–1.0)
Monocytes Relative: 12 %
Neutro Abs: 5.2 K/uL (ref 1.7–7.7)
Neutrophils Relative %: 73 %
Platelets: 125 K/uL — ABNORMAL LOW (ref 150–400)
RBC: 4.03 MIL/uL (ref 3.87–5.11)
RDW: 12.5 % (ref 11.5–15.5)
WBC: 7.2 K/uL (ref 4.0–10.5)
nRBC: 0 % (ref 0.0–0.2)

## 2024-01-14 MED ORDER — HYDROCODONE-ACETAMINOPHEN 5-325 MG PO TABS: 1.0000 | ORAL_TABLET | ORAL | 0 refills | Status: DC | PRN

## 2024-01-14 MED ORDER — ONDANSETRON 4 MG PO TBDP: 4.0000 mg | ORAL_TABLET | Freq: Four times a day (QID) | ORAL | 0 refills | Status: DC | PRN

## 2024-01-14 MED ORDER — CLOPIDOGREL BISULFATE 75 MG PO TABS: 75.0000 mg | ORAL_TABLET | Freq: Every day | ORAL | Status: AC

## 2024-01-14 NOTE — Care Management Important Message (Signed)
 Important Message  Patient Details  Name: Chelsey Johnson MRN: [id number] Date of Birth: 05/02/1950   Important Message Given:  N/A - LOS <3 / Initial given by admissions     Duwaine LITTIE Ada 01/14/2024, 11:03 AM

## 2024-01-14 NOTE — Discharge Summary (Signed)
 Physician Discharge Summary  Patient ID: Chelsey Johnson MRN: [id number] DOB/AGE: May 16, 1950 73 y.o.  Admit date: 01/11/2024 Discharge date: 01/14/2024  Admission Diagnoses: Incarcerated hernia with colon  Discharge Diagnoses:  Principal Problem:   Incarcerated hernia Active Problems:   Incarcerated umbilical hernia   Discharged Condition: good  Hospital Course: [name] is a 73 yo who came in with an incarcerated hernia. She had colon in the hernia and the ED could not reduce it. We took her back to surgery and reduced the colon and repaired the hernia with mesh. She was doing well post operatively. She had an NG that was removed POD 1 and her diet was advanced from clears to soft. She had a BM prior to discharge. She was ambulating.   Consults: None  Significant Diagnostic Studies:     Latest Ref Rng & Units 01/14/2024    5:02 AM 01/13/2024    4:54 AM 01/12/2024    4:48 AM  CBC  WBC 4.0 - 10.5 K/uL 7.2  8.9  8.0   Hemoglobin 12.0 - 15.0 g/dL 88.4  88.6  87.8   Hematocrit 36.0 - 46.0 % 35.1  34.5  36.6   Platelets 150 - 400 K/uL 125  128  149      Treatments: IV hydration and Open hernia repair with mesh and reduction of colon 01/11/24  Discharge Exam: Blood pressure 130/72, pulse 100, temperature 99.2 F (37.3 C), temperature source Oral, resp. rate (!) 21, height 5' 2 (1.575 m), weight 86.2 kg, SpO2 94%. General appearance: alert and no distress Resp: normal work of breathing GI: midline with staples, no erythema or drainage, dried blood removed with rag, binder in place   Disposition: Discharge disposition: 01-Home or Self Care       Discharge Instructions     Call MD for:  difficulty breathing, headache or visual disturbances   Complete by: As directed    Call MD for:  extreme fatigue   Complete by: As directed    Call MD for:  persistant dizziness or light-headedness   Complete by: As directed    Call MD for:  persistant nausea and vomiting    Complete by: As directed    Call MD for:  redness, tenderness, or signs of infection (pain, swelling, redness, odor or green/yellow discharge around incision site)   Complete by: As directed    Call MD for:  severe uncontrolled pain   Complete by: As directed    Call MD for:  temperature >100.4   Complete by: As directed    Increase activity slowly   Complete by: As directed       Allergies as of 01/14/2024       Reactions   Anesthetics, Amide Palpitations   Pt reports Tachycardia w/ HRs 170's with General Anesthesia but cannot give details regarding names of medications.   Tylox [oxycodone-acetaminophen ] Palpitations   Tachycardia   Iodinated Contrast Media Itching   Pt began itching after Omnipaque  injection.         Medication List     TAKE these medications    anastrozole  1 MG tablet Commonly known as: ARIMIDEX  Take 1 tablet by mouth once daily   Calcium  500 MG tablet Take 1 tablet (500 mg total) by mouth daily.   celecoxib  200 MG capsule Commonly known as: CELEBREX  Take 200 mg by mouth daily as needed.   clopidogrel 75 MG tablet Commonly known as: PLAVIX Take 1 tablet (75 mg total) by mouth daily. Start  taking on: January 17, 2024 What changed: These instructions start on January 17, 2024. If you are unsure what to do until then, ask your doctor or other care provider.   furosemide 20 MG tablet Commonly known as: LASIX Take 20 mg by mouth daily as needed for fluid or edema.   HYDROcodone -acetaminophen  5-325 MG tablet Commonly known as: NORCO/VICODIN Take 1 tablet by mouth every 4 (four) hours as needed for severe pain (pain score 7-10).   ICY HOT EX Apply topically.   losartan-hydrochlorothiazide  50-12.5 MG tablet Commonly known as: HYZAAR Take 1 tablet by mouth daily.   ondansetron  4 MG disintegrating tablet Commonly known as: ZOFRAN -ODT Take 1 tablet (4 mg total) by mouth every 6 (six) hours as needed for nausea.   rosuvastatin 10 MG  tablet Commonly known as: CRESTOR Take 10 mg by mouth daily.   VITAMIN D3 PO Take 1 tablet by mouth daily.        Follow-up Information     Kallie Manuelita BROCKS, MD Follow up on 01/26/2024.   Specialty: General Surgery Why: staple removal Contact information: [address] [place] [facility] [address] [phone]                Future Appointments  Date Time Provider Department Center  01/26/2024 10:45 AM Kallie Manuelita BROCKS, MD RS-RS None  03/01/2024  1:00 PM [facility] LAB [facility] None  03/01/2024  2:00 PM AP-MM/VASC US  3 AP-US  [place] [facility]  03/01/2024  2:00 PM AP-MM DIAG AP-MM [place] [facility]  03/01/2024  2:10 PM AP-MM/VASC US  3 AP-US  [place] [facility]  03/08/2024  2:00 PM Lamon Pleasant HERO, PA-C [facility] None    Signed: Manuelita BROCKS Kallie 01/14/2024, 10:11 AM

## 2024-01-14 NOTE — Anesthesia Postprocedure Evaluation (Signed)
 Anesthesia Post Note  Patient: Chelsey Johnson  Procedure(s) Performed: LAPAROTOMY, EXPLORATORY; Hernia Repair with Mesh, Reduction of Colon (Abdomen)  Patient location during evaluation: Phase II Anesthesia Type: General Level of consciousness: awake Pain management: pain level controlled Vital Signs Assessment: post-procedure vital signs reviewed and stable Respiratory status: spontaneous breathing and respiratory function stable Cardiovascular status: blood pressure returned to baseline and stable Postop Assessment: no headache and no apparent nausea or vomiting Anesthetic complications: no Comments: Late entry   No notable events documented.   Last Vitals:  Vitals:   01/13/24 1958 01/14/24 0435  BP: 138/77 130/72  Pulse: (!) 108 100  Resp: 20 (!) 21  Temp: 37.4 C 37.3 C  SpO2: 93% 94%    Last Pain:  Vitals:   01/14/24 0435  TempSrc: Oral  PainSc:                  Yvonna JINNY Bosworth

## 2024-01-14 NOTE — Plan of Care (Signed)

## 2024-01-14 NOTE — Plan of Care (Signed)
  Problem: Education: Goal: Knowledge of General Education information will improve Description: Including pain rating scale, medication(s)/side effects and non-pharmacologic comfort measures Outcome: Adequate for Discharge   Problem: Health Behavior/Discharge Planning: Goal: Ability to manage health-related needs will improve Outcome: Adequate for Discharge   Problem: Clinical Measurements: Goal: Ability to maintain clinical measurements within normal limits will improve Outcome: Adequate for Discharge Goal: Will remain free from infection Outcome: Adequate for Discharge Goal: Diagnostic test results will improve Outcome: Adequate for Discharge Goal: Respiratory complications will improve Outcome: Adequate for Discharge Goal: Cardiovascular complication will be avoided Outcome: Adequate for Discharge   Problem: Activity: Goal: Risk for activity intolerance will decrease Outcome: Adequate for Discharge   Problem: Nutrition: Goal: Adequate nutrition will be maintained Outcome: Adequate for Discharge   Problem: Elimination: Goal: Will not experience complications related to bowel motility Outcome: Adequate for Discharge Goal: Will not experience complications related to urinary retention Outcome: Adequate for Discharge   Problem: Pain Managment: Goal: General experience of comfort will improve and/or be controlled Outcome: Adequate for Discharge   Problem: Safety: Goal: Ability to remain free from injury will improve Outcome: Adequate for Discharge   Problem: Skin Integrity: Goal: Risk for impaired skin integrity will decrease Outcome: Adequate for Discharge

## 2024-01-14 NOTE — Discharge Instructions (Addendum)
 Discharge Open Abdominal Surgery Instructions:  Restart plavix on Sunday 01/17/24.   Common Complaints: Pain at the incision site is common. This will improve with time. Take your pain medications as described below. Some nausea is common and poor appetite. The main goal is to stay hydrated the first few days after surgery.   Diet/ Activity: Diet as tolerated. You have started and tolerated a diet in the hospital, and should continue to increase what you are able to eat.   You may not have a large appetite, but it is important to stay hydrated. Drink 64 ounces of water a day. Your appetite will return with time.  Shower per your regular routine daily.   Wear an abdominal binder daily with activity. You do not have to wear this while sleeping or sitting.  Rest and listen to your body, but do not remain in bed all day.  Walk everyday for at least 15-20 minutes. Deep cough and move around every 1-2 hours in the first few days after surgery.  Do not lift > 10 lbs, perform excessive bending, pushing, pulling, squatting for 6-8 weeks after surgery.  The activity restrictions and the abdominal binder are to prevent hernia formation at your incision while you are healing.  Do not place lotions or balms on your incision unless instructed to specifically by Dr. Kallie.   Pain Expectations and Narcotics: -After surgery you will have pain associated with your incisions and this is normal. The pain is muscular and nerve pain, and will get better with time. -You are encouraged and expected to take non narcotic medications like tylenol  and ibuprofen  (when able) to treat pain as multiple modalities can aid with pain treatment. -Narcotics are only used when pain is severe or there is breakthrough pain. -You are not expected to have a pain score of 0 after surgery, as we cannot prevent pain. A pain score of 3-4 that allows you to be functional, move, walk, and tolerate some activity is the goal. The pain will  continue to improve over the days after surgery and is dependent on your surgery. -Due to [place] law, we are only able to give a certain amount of pain medication to treat post operative pain, and we only give additional narcotics on a patient by patient basis.  -For most laparoscopic surgery, studies have shown that the majority of patients only need 10-15 narcotic pills, and for open surgeries most patients only need 15-20.   -Having appropriate expectations of pain and knowledge of pain management with non narcotics is important as we do not want anyone to become addicted to narcotic pain medication.  -Using ice packs in the first 48 hours and heating pads after 48 hours, wearing an abdominal binder (when recommended), and using over the counter medications are all ways to help with pain management.   -Simple acts like meditation and mindfulness practices after surgery can also help with pain control and research has proven the benefit of these practices.  Medication: Take tylenol  as needed for pain control.  Take Hydrocodone /Acetaminophen  for breakthrough pain every 4 hours.  Take Colace for constipation related to narcotic pain medication. If you do not have a bowel movement in 2 days, take Miralax -over the counter.  Drink plenty of water to also prevent constipation.   Contact Information: If you have questions or concerns, please call our office, [phone], Monday- Thursday 8AM-5PM and Friday 8AM-12Noon.  If it is after hours or on the weekend, please call [facility] Main [facility],  [phone], [phone], and ask to speak to the surgeon on call for Dr. Kallie at [facility].  -

## 2024-01-18 ENCOUNTER — Emergency Department (HOSPITAL_COMMUNITY)
Admission: EM | Admit: 2024-01-18 | Discharge: 2024-01-18 | Disposition: A | Discharge status: Home / Self Care | Attending: Emergency Medicine | Admitting: Emergency Medicine

## 2024-01-18 ENCOUNTER — Encounter (HOSPITAL_COMMUNITY): Payer: Self-pay

## 2024-01-18 ENCOUNTER — Other Ambulatory Visit: Payer: Self-pay

## 2024-01-18 ENCOUNTER — Emergency Department (HOSPITAL_COMMUNITY)

## 2024-01-18 DIAGNOSIS — Z7902 Long term (current) use of antithrombotics/antiplatelets: Secondary | ICD-10-CM | POA: Insufficient documentation

## 2024-01-18 DIAGNOSIS — M79671 Pain in right foot: Secondary | ICD-10-CM | POA: Diagnosis present

## 2024-01-18 HISTORY — DX: Unspecified abdominal hernia with obstruction, without gangrene: K46.0

## 2024-01-18 MED ORDER — COLCHICINE 0.6 MG PO TABS: 0.6000 mg | ORAL_TABLET | Freq: Two times a day (BID) | ORAL | 0 refills | Status: AC

## 2024-01-18 MED ORDER — NAPROXEN 250 MG PO TABS
500.0000 mg | ORAL_TABLET | Freq: Once | ORAL | Status: AC
  Administered 2024-01-18: 500 mg via ORAL
  Filled 2024-01-18: qty 2

## 2024-01-18 MED ORDER — COLCHICINE 0.6 MG PO TABS
1.2000 mg | ORAL_TABLET | ORAL | Status: AC
  Administered 2024-01-18: 1.2 mg via ORAL
  Filled 2024-01-18: qty 2

## 2024-01-18 NOTE — ED Triage Notes (Signed)
 Pt arrived via POV from home c/o bilateral foot pain that began after returning home from recent hospitalization for an incarcerated hernia. Pt reports she recently stopped taking her blood thinner as well.

## 2024-01-18 NOTE — ED Notes (Signed)
 ED Provider at bedside.

## 2024-01-18 NOTE — Discharge Instructions (Signed)
 Your x-rays did not show any signs of abnormalities of your bones, no infections, no trauma, there does appear to be some arthritis in the middle of the foot, I want you to take the medication called colchicine twice a day for the next 4 days, your next dose will be tomorrow morning.  We have given you the first 2 pills here in the emergency department.  You can follow-up with your doctor in the office if the pain lasts throughout the week, return to the ER for severe worsening pain swelling redness fevers or any worsening symptoms

## 2024-01-18 NOTE — ED Provider Notes (Signed)
 [place] EMERGENCY DEPARTMENT AT [facility] Provider Note   CSN: [id number] Arrival date & time: 01/18/24  1226     Patient presents with: Foot Pain   Chelsey Johnson is a 73 y.o. female.    Foot Pain   This is a 73 year old female, she has a history of incarcerated umbilical hernia with mesh repair which occurred several days ago.  She was discharged on the 17th, she did well postoperatively but upon returning home she has noticed that she has developed some pain in her right foot on the dorsum and the heel as well.  The patient had been on Plavix in the past, she was instructed to start taking the clopidogrel again on October 19 postoperatively.  She reports that she has had absolutely no abdominal pain nausea or vomiting and is done very well with regards to the surgical repair of her abdomen however the right foot is continued to hurt, it is over the heel and the dorsum of the foot, there is no swelling no trauma no injury no some bruising, she has no numbness in that foot, the left foot does not really hurt.  In the past she had had some knee pain on the left but that is resolved.  She denies any other vascular abnormality such as DVT or arterial occlusions.  The patient has had ultrasounds of her carotids a couple of years ago, it showed that she had minimal bilateral atherosclerotic plaque, approximately 12 years ago she had ABIs and ultrasound arterial segment of her lower extremities which showed no evidence of vaso-occlusive disease with normal ABIs.  The patient only has pain in the right foot when she tries to stand    Prior to Admission medications   Medication Sig Start Date End Date Taking? Authorizing Provider  colchicine 0.6 MG tablet Take 1 tablet (0.6 mg total) by mouth 2 (two) times daily. 01/18/24  Yes Cleotilde Rogue, MD  anastrozole  (ARIMIDEX ) 1 MG tablet Take 1 tablet by mouth once daily 10/06/23   Katragadda, Sreedhar, MD  Calcium  500 MG tablet Take 1  tablet (500 mg total) by mouth daily. 03/03/22   Lamon Pleasant HERO, PA-C  celecoxib  (CELEBREX ) 200 MG capsule Take 200 mg by mouth daily as needed. 12/08/23   [provider]  Cholecalciferol (VITAMIN D3 PO) Take 1 tablet by mouth daily.    [provider]  clopidogrel (PLAVIX) 75 MG tablet Take 1 tablet (75 mg total) by mouth daily. 01/17/24   Kallie Manuelita BROCKS, MD  furosemide (LASIX) 20 MG tablet Take 20 mg by mouth daily as needed for fluid or edema.    [provider]  HYDROcodone -acetaminophen  (NORCO/VICODIN) 5-325 MG tablet Take 1 tablet by mouth every 4 (four) hours as needed for severe pain (pain score 7-10). 01/14/24   Kallie Manuelita BROCKS, MD  losartan-hydrochlorothiazide  (HYZAAR) 50-12.5 MG tablet Take 1 tablet by mouth daily.    [provider]  Menthol, Topical Analgesic, (ICY HOT EX) Apply topically.    [provider]  ondansetron  (ZOFRAN -ODT) 4 MG disintegrating tablet Take 1 tablet (4 mg total) by mouth every 6 (six) hours as needed for nausea. 01/14/24   Kallie Manuelita BROCKS, MD  rosuvastatin (CRESTOR) 10 MG tablet Take 10 mg by mouth daily. 12/16/21   [provider]    Allergies: Anesthetics, amide; Tylox [oxycodone-acetaminophen ]; and Iodinated contrast media    Review of Systems  All other systems reviewed and are negative.   Updated Vital Signs BP  123/71   Temp 97.8 F (36.6 C) (Oral)   Resp 17   Ht 1.575 m (5' 2)   Wt 86.2 kg   SpO2 97%   BMI 34.75 kg/m   Physical Exam Vitals and nursing note reviewed.  Constitutional:      General: She is not in acute distress.    Appearance: She is well-developed.  HENT:     Head: Normocephalic and atraumatic.     Mouth/Throat:     Pharynx: No oropharyngeal exudate.  Eyes:     General: No scleral icterus.       Right eye: No discharge.        Left eye: No discharge.     Conjunctiva/sclera: Conjunctivae normal.     Pupils: Pupils are equal, round, and reactive to  light.  Neck:     Thyroid : No thyromegaly.     Vascular: No JVD.  Cardiovascular:     Rate and Rhythm: Normal rate and regular rhythm.     Heart sounds: Normal heart sounds. No murmur heard.    No friction rub. No gallop.  Pulmonary:     Effort: Pulmonary effort is normal. No respiratory distress.     Breath sounds: Normal breath sounds. No wheezing or rales.  Abdominal:     General: Bowel sounds are normal. There is no distension.     Palpations: Abdomen is soft. There is no mass.     Tenderness: There is no abdominal tenderness.     Comments: Soft nontender abdomen  Musculoskeletal:        General: Tenderness present. Normal range of motion.     Cervical back: Normal range of motion and neck supple.     Right lower leg: No edema.     Left lower leg: No edema.     Comments: The patient is able to straight leg raise on the right side, she can flex at the hip and the knee, she can extend at the knee she has normal strength and in fact she can flex and extend at the ankle with dorsiflexion and plantarflexion.  There is no tenderness with either passive or active range of motion of the heel however when pressure is placed on the heel or the dorsum of the foot there does appear to be some tenderness.  There is no warmth no swelling no redness and she has symmetrical capillary refill of 3 seconds in the bilateral feet.  She has palpable pulses in the bilateral feet of both the dorsalis pedis and the posterior tibialis.  Lymphadenopathy:     Cervical: No cervical adenopathy.  Skin:    General: Skin is warm and dry.     Findings: No erythema or rash.  Neurological:     Mental Status: She is alert.     Coordination: Coordination normal.  Psychiatric:        Behavior: Behavior normal.     (all labs ordered are listed, but only abnormal results are displayed) Labs Reviewed - No data to display  EKG: None  Radiology: DG Foot Complete Right Result Date: 01/18/2024 CLINICAL DATA:   Right foot and ankle pain EXAM: RIGHT ANKLE - COMPLETE 3+ VIEW; RIGHT FOOT COMPLETE - 3+ VIEW COMPARISON:  None Available. FINDINGS: Right ankle: Frontal, oblique, and lateral views of the right ankle are obtained. There is diffuse soft tissue swelling. No acute fracture, subluxation, or dislocation. Joint spaces are well preserved. Prominent enthesopathic changes of the calcaneus, with small inferior calcaneal spur. Right foot:  Frontal, oblique, lateral views are obtained. No acute fracture. Hammertoe deformities are noted. Mild midfoot osteoarthritis. Diffuse soft tissue swelling. IMPRESSION: 1. Diffuse soft tissue swelling of the right foot and ankle. 2. Mild midfoot osteoarthritis. 3. Small inferior calcaneal spur. Electronically Signed   By: Ozell Daring M.D.   On: 01/18/2024 16:23   DG Ankle Complete Right Result Date: 01/18/2024 CLINICAL DATA:  Right foot and ankle pain EXAM: RIGHT ANKLE - COMPLETE 3+ VIEW; RIGHT FOOT COMPLETE - 3+ VIEW COMPARISON:  None Available. FINDINGS: Right ankle: Frontal, oblique, and lateral views of the right ankle are obtained. There is diffuse soft tissue swelling. No acute fracture, subluxation, or dislocation. Joint spaces are well preserved. Prominent enthesopathic changes of the calcaneus, with small inferior calcaneal spur. Right foot: Frontal, oblique, lateral views are obtained. No acute fracture. Hammertoe deformities are noted. Mild midfoot osteoarthritis. Diffuse soft tissue swelling. IMPRESSION: 1. Diffuse soft tissue swelling of the right foot and ankle. 2. Mild midfoot osteoarthritis. 3. Small inferior calcaneal spur. Electronically Signed   By: Ozell Daring M.D.   On: 01/18/2024 16:23     Procedures   Medications Ordered in the ED  naproxen (NAPROSYN) tablet 500 mg (has no administration in time range)  colchicine tablet 1.2 mg (has no administration in time range)                                    Medical Decision Making Amount and/or  Complexity of Data Reviewed Radiology: ordered.  Risk Prescription drug management.   Pain in the right heel, vital signs unremarkable, check imaging of the foot and the ankle, could be early gout, no obvious trauma, no foreign bodies embedded in the bottom of the foot on my exam, there is no redness or suggestion this is cellulitis, she has pulses so I doubt that this is a vascular cause and there is no edema of the leg to suggest that this is DVT.   Radiology Imaging: I personally viewed the images of the ordered radiographic studies and find midfoot arthritis but no other significant findings other than a calcaneal spur I agree with the radiologist interpretation as well  Meds / Interventions: while in the ED the patient received the following: Colchicine The response to the interventions was that the patient able  No signs of infection, vital signs unremarkable, stable for discharge     Final diagnoses:  Foot pain, right    ED Discharge Orders          Ordered    colchicine 0.6 MG tablet  2 times daily        01/18/24 1636               Cleotilde Rogue, MD 01/18/24 [phone]

## 2024-01-26 ENCOUNTER — Encounter: Payer: Self-pay | Admitting: General Surgery

## 2024-01-26 ENCOUNTER — Ambulatory Visit: Admitting: General Surgery

## 2024-01-26 VITALS — BP 124/82 | HR 81 | Temp 98.3°F | Resp 18 | Ht 62.0 in | Wt 188.0 lb

## 2024-01-26 DIAGNOSIS — K432 Incisional hernia without obstruction or gangrene: Secondary | ICD-10-CM | POA: Diagnosis not present

## 2024-01-26 NOTE — Progress Notes (Signed)
 [facility]  Doing well. Has been having flatus but did not have a BM in last day or two. No nausea or vomiting. Eating.  BP 124/82   Pulse 81   Temp 98.3 F (36.8 C) (Oral)   Resp 18   Ht 5' 2 (1.575 m)   Wt 188 lb (85.3 kg)   SpO2 98%   BMI 34.39 kg/m  Soft, nondistended, nontender, no signs of recurrence Midline staple removed, steristrips placed, no erythema or drainage  Patient s/p Ventral hernia repair with mesh for incarcerated hernia. Doing well.  Keep stools regular and soft. Take your prune juice and if needed miralax to have a BM. Diet as tolerated. Call with issues. No heavy lifting > 10 lbs, excessive bending, pushing, pulling, or squatting for 6-8 weeks after surgery.  Steristrips will peel off in the next 5-7 days. You can remove them once they are peeling off. It is ok to shower. Pat the area dry.   Future Appointments  Date Time Provider Department Center  01/26/2024 10:45 AM Kallie Manuelita BROCKS, MD RS-RS None  01/27/2024 11:00 AM Joya Stabs, DPM [facility]  03/01/2024  1:00 PM [facility] LAB [facility] None  03/01/2024  2:00 PM AP-MM/VASC US  3 AP-US  [place] [facility]  03/01/2024  2:00 PM AP-MM DIAG AP-MM [place] [facility]  03/01/2024  2:10 PM AP-MM/VASC US  3 AP-US  [place] [facility]  03/08/2024 10:45 AM Kallie Manuelita BROCKS, MD RS-RS None  03/08/2024  2:00 PM Lamon Pleasant HERO, PA-C [facility] None  Manuelita Kallie, MD [facility] [address] [place] [address], [place] [address] [phone] (office)

## 2024-01-26 NOTE — Patient Instructions (Signed)
 Keep stools regular and soft. Take your prune juice and if needed miralax to have a BM. Diet as tolerated. Call with issues. No heavy lifting > 10 lbs, excessive bending, pushing, pulling, or squatting for 6-8 weeks after surgery.  Steristrips will peel off in the next 5-7 days. You can remove them once they are peeling off. It is ok to shower. Pat the area dry.

## 2024-01-27 ENCOUNTER — Ambulatory Visit: Admitting: Podiatry

## 2024-01-27 ENCOUNTER — Ambulatory Visit (INDEPENDENT_AMBULATORY_CARE_PROVIDER_SITE_OTHER)

## 2024-01-27 ENCOUNTER — Encounter: Payer: Self-pay | Admitting: Podiatry

## 2024-01-27 DIAGNOSIS — M7751 Other enthesopathy of right foot: Secondary | ICD-10-CM

## 2024-01-27 DIAGNOSIS — M722 Plantar fascial fibromatosis: Secondary | ICD-10-CM

## 2024-01-27 DIAGNOSIS — M2142 Flat foot [pes planus] (acquired), left foot: Secondary | ICD-10-CM

## 2024-01-27 DIAGNOSIS — M7752 Other enthesopathy of left foot: Secondary | ICD-10-CM

## 2024-01-27 DIAGNOSIS — M2141 Flat foot [pes planus] (acquired), right foot: Secondary | ICD-10-CM

## 2024-01-27 MED ORDER — CELECOXIB 200 MG PO CAPS: 200.0000 mg | ORAL_CAPSULE | Freq: Every day | ORAL | 0 refills | Status: AC

## 2024-01-27 NOTE — Patient Instructions (Signed)

## 2024-01-27 NOTE — Progress Notes (Signed)
  Subjective:  Patient ID: Chelsey Johnson, female    DOB: [birth year],   MRN: [id number]  Chief Complaint  Patient presents with   Foot Pain    Pain and weakness in R foot told she had bone spur from arthritis.  L leg/knee more sore than foot.  Not diabetic. Plavix and rosuvastatin.     73 y.o. female presents for concern of right foot pain and concern for left knee pain.  She relates this has been going on for quite a while.  She relates most pain after first steps after rest but has been getting more on and off pain even after sitting down.  She has inserts that she wears however she wears them upside down because she relates this feels better.  She was told she has a bone spur and arthritis in the right foot.  She is not diabetic.  She is on Plavix.   Denies any other pedal complaints. Denies n/v/f/c.   Past Medical History:  Diagnosis Date   Allergy    Anesthesia complication    tachycardia   Breast cancer (HCC) 2020   dcis   Complication of anesthesia    blood pressure drops quickly with anesthetic   HTN (hypertension)    Incarcerated hernia    Obesity    Personal history of radiation therapy 2020    Objective:  Physical Exam: Vascular: DP/PT pulses 2/4 bilateral. CFT <3 seconds. Normal hair growth on digits. No edema.  Skin. No lacerations or abrasions bilateral feet.  Musculoskeletal: MMT 5/5 bilateral lower extremities in DF, PF, Inversion and Eversion. Deceased ROM in DF of ankle joint. Tender to the medial calcaneal tubercle on right. No pain with achilles, PT or arch. No pain with calcaneal squeeze.  No pain to palpation on the left foot.  Bilateral pes planus noted. Neurological: Sensation intact to light touch.   Assessment:   1. Plantar fasciitis of right foot   2. Bilateral pes planus      Plan:  Patient was evaluated and treated and all questions answered. Discussed plantar fasciitis with patient.  X-rays reviewed and discussed with patient. No acute  fractures or dislocations noted. Mild spurring noted at inferior calcaneus.  Mild spurring noted to posterior calcaneus.  Pes planus noted bilateral worse on left. Discussed treatment options including, ice, NSAIDS, supportive shoes, bracing, and stretching. Stretching exercises provided to be done on a daily basis.   Prescription for Celebrex  provided and sent to pharmacy.  Kidney functions reviewed and within normal limits. Plantar fasciitis brace dispensed for right foot Follow-up 6 weeks or sooner if any problems arise. In the meantime, encouraged to call the office with any questions, concerns, change in symptoms.     Asberry Failing, DPM

## 2024-03-01 ENCOUNTER — Ambulatory Visit (HOSPITAL_COMMUNITY)
Admission: RE | Admit: 2024-03-01 | Discharge: 2024-03-01 | Disposition: A | Source: Ambulatory Visit | Attending: Physician Assistant

## 2024-03-01 ENCOUNTER — Inpatient Hospital Stay: Attending: Physician Assistant

## 2024-03-01 ENCOUNTER — Encounter (HOSPITAL_COMMUNITY): Payer: Self-pay

## 2024-03-01 DIAGNOSIS — R921 Mammographic calcification found on diagnostic imaging of breast: Secondary | ICD-10-CM | POA: Insufficient documentation

## 2024-03-01 DIAGNOSIS — D0511 Intraductal carcinoma in situ of right breast: Secondary | ICD-10-CM | POA: Insufficient documentation

## 2024-03-01 DIAGNOSIS — Z79811 Long term (current) use of aromatase inhibitors: Secondary | ICD-10-CM | POA: Insufficient documentation

## 2024-03-01 DIAGNOSIS — M8580 Other specified disorders of bone density and structure, unspecified site: Secondary | ICD-10-CM

## 2024-03-01 LAB — CBC WITH DIFFERENTIAL/PLATELET
Abs Immature Granulocytes: 0 K/uL (ref 0.00–0.07)
Basophils Absolute: 0 K/uL (ref 0.0–0.1)
Basophils Relative: 1 %
Eosinophils Absolute: 0.2 K/uL (ref 0.0–0.5)
Eosinophils Relative: 5 %
HCT: 39.8 % (ref 36.0–46.0)
Hemoglobin: 12.7 g/dL (ref 12.0–15.0)
Immature Granulocytes: 0 %
Lymphocytes Relative: 34 %
Lymphs Abs: 1.5 K/uL (ref 0.7–4.0)
MCH: 28.4 pg (ref 26.0–34.0)
MCHC: 31.9 g/dL (ref 30.0–36.0)
MCV: 89 fL (ref 80.0–100.0)
Monocytes Absolute: 0.6 K/uL (ref 0.1–1.0)
Monocytes Relative: 13 %
Neutro Abs: 2.1 K/uL (ref 1.7–7.7)
Neutrophils Relative %: 47 %
Platelets: 199 K/uL (ref 150–400)
RBC: 4.47 MIL/uL (ref 3.87–5.11)
RDW: 13.2 % (ref 11.5–15.5)
WBC: 4.4 K/uL (ref 4.0–10.5)
nRBC: 0 % (ref 0.0–0.2)

## 2024-03-01 LAB — COMPREHENSIVE METABOLIC PANEL WITH GFR
ALT: 13 U/L (ref 0–44)
AST: 23 U/L (ref 15–41)
Albumin: 4.4 g/dL (ref 3.5–5.0)
Alkaline Phosphatase: 76 U/L (ref 38–126)
Anion gap: 12 (ref 5–15)
BUN: 11 mg/dL (ref 8–23)
CO2: 25 mmol/L (ref 22–32)
Calcium: 10.5 mg/dL — ABNORMAL HIGH (ref 8.9–10.3)
Chloride: 106 mmol/L (ref 98–111)
Creatinine, Ser: 0.6 mg/dL (ref 0.44–1.00)
GFR, Estimated: >60 mL/min (ref >60)
Glucose, Bld: 96 mg/dL (ref 70–99)
Potassium: 4.2 mmol/L (ref 3.5–5.1)
Sodium: 143 mmol/L (ref 135–145)
Total Bilirubin: 0.5 mg/dL (ref 0.0–1.2)
Total Protein: 7.3 g/dL (ref 6.5–8.1)

## 2024-03-01 LAB — VITAMIN D 25 HYDROXY (VIT D DEFICIENCY, FRACTURES): Vit D, 25-Hydroxy: 52.36 ng/mL (ref 30–100)

## 2024-03-04 ENCOUNTER — Other Ambulatory Visit: Payer: Self-pay

## 2024-03-04 DIAGNOSIS — D0511 Intraductal carcinoma in situ of right breast: Secondary | ICD-10-CM

## 2024-03-06 MED ORDER — ANASTROZOLE 1 MG PO TABS: 1.0000 mg | ORAL_TABLET | Freq: Every day | ORAL | 0 refills | Status: DC

## 2024-03-07 ENCOUNTER — Other Ambulatory Visit: Payer: Self-pay | Admitting: *Deleted

## 2024-03-07 DIAGNOSIS — D0511 Intraductal carcinoma in situ of right breast: Secondary | ICD-10-CM

## 2024-03-07 MED ORDER — ANASTROZOLE 1 MG PO TABS: 1.0000 mg | ORAL_TABLET | Freq: Every day | ORAL | 0 refills | Status: AC

## 2024-03-07 NOTE — Telephone Encounter (Signed)
 Patient tolerating Anastrozole  and is to continue treatment per last OVN.

## 2024-03-07 NOTE — Progress Notes (Unsigned)
 VIRTUAL VISIT via TELEPHONE NOTE [facility]   I connected with Chelsey Johnson  on 03/08/24 at  2:00 PM by telephone and verified that I am speaking with the correct person using two identifiers.  Location: Patient: Home Provider: [facility]   I discussed the limitations, risks, security and privacy concerns of performing an evaluation and management service by telephone and the availability of in person appointments. I also discussed with the patient that there may be a patient responsible charge related to this service. The patient expressed understanding and agreed to proceed.  REASON FOR VISIT:  Follow-up for history of right breast DCIS   PRIOR THERAPY: Right partial mastectomy (03/09/2019) + radiation therapy completed in [facility] (February 2021)   CURRENT THERAPY: Anastrozole  since February 2021  BRIEF ONCOLOGIC HISTORY:   Oncology History  Ductal carcinoma in situ (DCIS) of right breast  03/09/2019 Initial Diagnosis   Ductal carcinoma in situ (DCIS) of right breast   04/05/2019 Cancer Staging   Staging form: Breast, AJCC 8th Edition - Clinical stage from 04/05/2019: Stage 0 (cTis (DCIS), cN0, cM0, ER+, PR+, HER2: Not Assessed) - Signed by Rogers Hai, MD on 04/05/2019     CANCER STAGING:  Cancer Staging  Ductal carcinoma in situ (DCIS) of right breast Staging form: Breast, AJCC 8th Edition - Clinical stage from 04/05/2019: Stage 0 (cTis (DCIS), cN0, cM0, ER+, PR+, HER2: Not Assessed) - Signed by Rogers Hai, MD on 04/05/2019 - Pathologic: No stage assigned - Unsigned   INTERVAL HISTORY:   [name], a 73 y.o. female, is contacted today for routine follow-up of her history of right breast DCIS.  She was originally scheduled for office visit, but requested to switch to phone visit due to weather. Alyha was last seen on 09/16/2023 by Pleasant Barefoot PA-C.   In there interim since last visit, she was hospitalized  from 01/11/2024 through 01/14/2024 for incarcerated umbilical hernia with colon, s/p surgical repair by Dr. Kallie. At today's visit, she  reports feeling fairly well. She denies any recent hospitalizations, surgeries, or changes in her  baseline health status. She reports 100% energy and 100% appetite.   She is maintaining stable weight at this time.  She denies any new breast lumps or axillary lymphadenopathy. She has some chronic knee pain, but denies any new onset aches/pains, headaches, or abdominal pain.   She continues to take anastrozole , and is tolerating it fairly well with occasional hot flashes and no significant arthralgias. She is taking vitamin D  and calcium .  ASSESSMENT & PLAN:  1.  Intermediate grade RIGHT breast DCIS (2020) - Screening mammogram on 01/31/2019 was BI-RADS category 0.  Additional views of the right breast showed intermediate 0.4 cm mass within the upper outer right breast which is new. - Right breast biopsy at 10 o'clock position on 02/15/2019 consistent with DCIS, intermediate grade, ER/PR positive. - Right partial mastectomy on 03/09/2019 showing 0.6 cm involving an intraductal papilloma, intermediate grade, in situ carcinoma less than 1 mm from inferior margin, not on ink.  Other margins negative.  Right inferior margin excision shows low-grade DCIS, 0.6 cm.  Not completely clear if it is a 2 foci or DCIS. - Radiation therapy completed in [place] in February 2021. - She has been on anastrozole  since February 2021.  She is tolerating it well with occasional hot flashes and no significant arthralgias.  Goal is 5 years of treatment (through February 2026) in the setting of DCIS. -  Area of tissue thickening and nodularity at inferior margin of right breast (6 o'clock position) noted on exam on 09/10/2022, along with unchanged right breast lymphedema in the lower outer quadrant and right breast upper outer quadrant scar unchanged. Right breast diagnostic mammogram  (09/23/2022): BI-RADS Category 2, benign (stable lumpectomy changes in right breast) - Bilateral screening mammogram on 02/25/2023 that was BI-RADS Category 0 due to left breast calcifications warranting further evaluation.   Diagnostic mammogram of left breast (03/10/2023) showed indeterminate 1.5 cm group of amorphous calcifications in upper outer posterior left breast as well as indeterminate 0.5 cm group of coarse heterogeneous calcifications in upper outer left breast. Stereotactic guided core biopsy of these 2 groups of calcifications on 03/16/2023. Pathology resulted on 03/17/2023 was benign (fibroadenomatoid change with dystrophic calcifications, negative for carcinoma). - Diagnostic mammogram LEFT breast (09/15/2023) obtained for 68-month follow-up of fibroadenomatoid changes with dystrophic calcifications: Previously biopsied left upper outer quadrant calcifications unchanged.  BI-RADS Category 3, probably benign. - Most recent bilateral diagnostic mammogram for 1 year follow-up of probably benign left breast calcifications (03/01/2024): BI-RADS Category 3, probably benign.  Previously biopsied left upper outer quadrant calcifications demonstrate 1 year stability.  No evidence of right breast malignancy.  Radiologist recommends bilateral diagnostic mammogram with magnification views of left breast in 1 year. - Most recent breast exam (09/16/2023): RIGHT BREAST: Stable area of tissue thickening and nodularity at the inferior margin of the right breast at approximately 6-7 o'clock position - this is stable compared to prior exam (see above).  Unchanged right breast lymphedema in lower outer quadrant and right breast upper outer quadrant scar unchanged. LEFT BREAST: Nodularity of left breast in the lower outer quadrant.  Diagnostic mammogram from last week is reassuring. LYMPH NODES: No axillary, pectoral, or epitrochlear lymphadenopathy bilaterally. - Patient history/ROS today negative for any red  flag symptoms of recurrence - Most recent labs (03/01/2024) show normal LFTs and unremarkable CBC. - Bilateral breast tenderness likely related to anastrozole . - PLAN: Labs in 6 months (CBC/D, CMP, vitamin D ) with OFFICE visit 1 week after. - Continue daily anastrozole  through February 2026.  (Will discontinue at next visit) - Bilateral DIAGNOSTIC mammogram due December 2026, with magnification views of LEFT breast - No indication for BCI testing in the setting of DCIS.   2.  Bone density - DEXA scan on 04/15/2019 with T-score - 0.7 - BONE DENSITY SCAN (09/23/2022) showed significantly worsened bone density, with T-score -2.3  FRAX = probability of major osteoporotic fracture 8.9% within the next 10 years.  Probability of hip fracture 1.9% within the next 10 years. - Most recent vitamin D  (03/01/2024) normal at 52.36.  Calcium  mildly elevated 10.5. - She is taking calcium  EOD and vitamin D  supplement daily. - PLAN: Continue vitamin D , but temporarily HOLD calcium  for now.   - Extensive discussion regarding risks/benefits of Prolia.  Have strongly recommended Prolia due to rapid deterioration of bone density in the setting of anastrozole .  Patient declines Prolia and other offered treatments (Fosamax) at this time. - Patient reports that she is trying to increase weightbearing exercises and will continue calcium  and vitamin D . - Repeat DEXA/bone density scan due June 2026   3.  Family history - Patient has 2 sisters with breast cancer - Patient's father had lymphoma - Patient's brother had prostate cancer - Patient's niece had ovarian cancer - Paternal aunt had stomach cancer - PLAN: Patient declined referral to genetic counselor - she does not have children and reports she will  just pray for the best in regards to her own health  PLAN SUMMARY:  >> Labs in 6 months = CBC/D, CMP, vitamin D  >> OFFICE visit in 6 months   AFTER NEXT VISIT - DEXA/bone density June 2026 - Diagnostic  mammogram December 2026    REVIEW OF SYSTEMS:   Review of Systems  Constitutional:  Negative for chills, diaphoresis, fever, malaise/fatigue and weight loss.  Respiratory:  Negative for cough and shortness of breath.   Cardiovascular:  Negative for chest pain and palpitations.  Gastrointestinal:  Negative for abdominal pain, blood in stool, melena, nausea and vomiting.  Musculoskeletal:  Positive for joint pain.  Neurological:  Positive for tingling. Negative for dizziness and headaches.     PHYSICAL EXAM: (per limitations of virtual telephone visit)  The patient is alert and oriented x 3, exhibiting adequate mentation, good mood, and ability to speak in full sentences and execute sound judgement.  WRAP UP:   I discussed the assessment and treatment plan with the patient. The patient was provided an opportunity to ask questions and all were answered. The patient agreed with the plan and demonstrated an understanding of the instructions.   The patient was advised to call back or seek an in-person evaluation if the symptoms worsen or if the condition fails to improve as anticipated.  I provided 22 minutes of non-face-to-face time during this encounter, including > 10 minutes of medical discussion.  Pleasant CHRISTELLA Barefoot, PA-C 03/08/24 2:32 PM

## 2024-03-08 ENCOUNTER — Ambulatory Visit: Admitting: General Surgery

## 2024-03-08 ENCOUNTER — Inpatient Hospital Stay: Admitting: Physician Assistant

## 2024-03-08 DIAGNOSIS — M8580 Other specified disorders of bone density and structure, unspecified site: Secondary | ICD-10-CM

## 2024-03-08 DIAGNOSIS — D0511 Intraductal carcinoma in situ of right breast: Secondary | ICD-10-CM

## 2024-03-08 DIAGNOSIS — K432 Incisional hernia without obstruction or gangrene: Secondary | ICD-10-CM

## 2024-03-08 NOTE — Addendum Note (Signed)
 Addended by: LAMON HERTER on: 03/08/2024 10:17 PM   Modules accepted: Orders

## 2024-03-08 NOTE — Progress Notes (Signed)
 [facility]  I am calling the patient for post operative evaluation. This is not a billable encounter as it is under the global charges for the surgery.  The patient had a ventral hernia repair with mesh for on 01/11/24. The patient reports that she is doing well. The are tolerating a diet, having good pain control, and having regular Bms.  The incisions are healed. The patient has no major concerns. She does have a small 1.5-2 inch knot at her umbilicus that does not hurt. This is likely scarring in of the mesh. Told her to monitor this and let us  know if it is getting larger or causing her pain.  Activity as tolerated.  Will see the patient PRN.   Manuelita Pander, MD [facility] [address] [place], [place] [id number] [phone] (office)

## 2024-03-09 ENCOUNTER — Encounter: Payer: Self-pay | Admitting: Podiatry

## 2024-03-09 ENCOUNTER — Ambulatory Visit: Admitting: Podiatry

## 2024-03-09 DIAGNOSIS — M722 Plantar fascial fibromatosis: Secondary | ICD-10-CM

## 2024-03-09 NOTE — Progress Notes (Signed)
°  Subjective:  Patient ID: Chelsey Johnson, female    DOB: January 09, 1951,   MRN: [id number]  Chief Complaint  Patient presents with   Plantar Fasciitis    It's doing much better.  I feel a pain every now and then.    73 y.o. female presents for follow-up of right foot plantar fasciitis.  She relates it is doing much better about 73%.  She relates some pain every now and then.  She has been stretching and wearing the brace.  Celebrex  did help.  She is not diabetic.  She is on Plavix .   Denies any other pedal complaints. Denies n/v/f/c.   Past Medical History:  Diagnosis Date   Allergy    Anesthesia complication    tachycardia   Breast cancer (HCC) 2020   dcis   Complication of anesthesia    blood pressure drops quickly with anesthetic   HTN (hypertension)    Incarcerated hernia    Obesity    Personal history of radiation therapy 2020    Objective:  Physical Exam: Vascular: DP/PT pulses 2/4 bilateral. CFT <3 seconds. Normal hair growth on digits. No edema.  Skin. No lacerations or abrasions bilateral feet.  Musculoskeletal: MMT 5/5 bilateral lower extremities in DF, PF, Inversion and Eversion. Deceased ROM in DF of ankle joint.  Minimally tender to the medial calcaneal tubercle on right. No pain with achilles, PT or arch. No pain with calcaneal squeeze.  No pain to palpation on the left foot.  Bilateral pes planus noted. Neurological: Sensation intact to light touch.   Assessment:   1. Plantar fasciitis of right foot       Plan:  Patient was evaluated and treated and all questions answered. Discussed plantar fasciitis with patient.  X-rays reviewed and discussed with patient. No acute fractures or dislocations noted. Mild spurring noted at inferior calcaneus.  Mild spurring noted to posterior calcaneus.  Pes planus noted bilateral worse on left. Discussed treatment options including, ice, NSAIDS, supportive shoes, bracing, and stretching.  Continue stretching and  support. Continue anti-inflammatories as needed. Follow-up as needed    Asberry Failing, DPM

## 2024-03-15 ENCOUNTER — Other Ambulatory Visit

## 2024-03-22 ENCOUNTER — Ambulatory Visit: Admitting: Physician Assistant

## 2024-08-30 ENCOUNTER — Inpatient Hospital Stay

## 2024-09-06 ENCOUNTER — Inpatient Hospital Stay: Admitting: Physician Assistant
